# Patient Record
Sex: Female | Born: 1940 | Race: White | Hispanic: No | Smoking: Never smoker
Health system: Southern US, Community
[De-identification: ages and names within clinical notes are randomized; demographics above are authoritative.]

## PROBLEM LIST (undated history)

## (undated) DIAGNOSIS — F0390 Unspecified dementia without behavioral disturbance: Secondary | ICD-10-CM

## (undated) DIAGNOSIS — I10 Essential (primary) hypertension: Secondary | ICD-10-CM

## (undated) HISTORY — PX: BACK SURGERY: SHX140

## (undated) HISTORY — PX: NOSE SURGERY: SHX723

## (undated) HISTORY — PX: INNER EAR SURGERY: SHX679

---

## 2000-01-31 ENCOUNTER — Other Ambulatory Visit: Admission: RE | Admit: 2000-01-31 | Discharge: 2000-01-31 | Payer: Self-pay | Admitting: *Deleted

## 2000-03-14 ENCOUNTER — Encounter: Admission: RE | Admit: 2000-03-14 | Discharge: 2000-03-14 | Payer: Self-pay | Admitting: *Deleted

## 2000-03-14 ENCOUNTER — Encounter: Payer: Self-pay | Admitting: *Deleted

## 2001-03-24 ENCOUNTER — Encounter: Payer: Self-pay | Admitting: Internal Medicine

## 2001-03-24 ENCOUNTER — Encounter: Admission: RE | Admit: 2001-03-24 | Discharge: 2001-03-24 | Payer: Self-pay | Admitting: Internal Medicine

## 2001-06-18 ENCOUNTER — Emergency Department (HOSPITAL_COMMUNITY): Admission: EM | Admit: 2001-06-18 | Discharge: 2001-06-18 | Payer: Self-pay | Admitting: Emergency Medicine

## 2001-06-18 ENCOUNTER — Encounter: Payer: Self-pay | Admitting: Emergency Medicine

## 2001-09-04 ENCOUNTER — Encounter: Admission: RE | Admit: 2001-09-04 | Discharge: 2001-10-10 | Payer: Self-pay | Admitting: Specialist

## 2002-03-25 ENCOUNTER — Encounter: Admission: RE | Admit: 2002-03-25 | Discharge: 2002-03-25 | Payer: Self-pay | Admitting: Unknown Physician Specialty

## 2002-03-25 ENCOUNTER — Encounter: Payer: Self-pay | Admitting: Internal Medicine

## 2002-03-26 ENCOUNTER — Other Ambulatory Visit: Admission: RE | Admit: 2002-03-26 | Discharge: 2002-03-26 | Payer: Self-pay | Admitting: Internal Medicine

## 2002-10-01 ENCOUNTER — Ambulatory Visit (HOSPITAL_COMMUNITY): Admission: RE | Admit: 2002-10-01 | Discharge: 2002-10-01 | Payer: Self-pay | Admitting: Neurology

## 2002-10-01 ENCOUNTER — Encounter: Payer: Self-pay | Admitting: Neurology

## 2003-03-29 ENCOUNTER — Encounter: Admission: RE | Admit: 2003-03-29 | Discharge: 2003-03-29 | Payer: Self-pay | Admitting: Internal Medicine

## 2003-03-29 ENCOUNTER — Encounter: Payer: Self-pay | Admitting: Internal Medicine

## 2003-04-05 ENCOUNTER — Encounter: Admission: RE | Admit: 2003-04-05 | Discharge: 2003-04-05 | Payer: Self-pay | Admitting: Internal Medicine

## 2003-04-05 ENCOUNTER — Encounter: Payer: Self-pay | Admitting: Internal Medicine

## 2003-11-28 ENCOUNTER — Emergency Department (HOSPITAL_COMMUNITY): Admission: EM | Admit: 2003-11-28 | Discharge: 2003-11-28 | Payer: Self-pay | Admitting: Family Medicine

## 2004-03-29 ENCOUNTER — Other Ambulatory Visit: Admission: RE | Admit: 2004-03-29 | Discharge: 2004-03-29 | Payer: Self-pay | Admitting: Internal Medicine

## 2004-04-06 ENCOUNTER — Encounter: Admission: RE | Admit: 2004-04-06 | Discharge: 2004-04-06 | Payer: Self-pay | Admitting: Internal Medicine

## 2005-04-12 ENCOUNTER — Encounter: Admission: RE | Admit: 2005-04-12 | Discharge: 2005-04-12 | Payer: Self-pay | Admitting: Internal Medicine

## 2006-04-16 ENCOUNTER — Encounter: Admission: RE | Admit: 2006-04-16 | Discharge: 2006-04-16 | Payer: Self-pay | Admitting: Internal Medicine

## 2006-08-22 ENCOUNTER — Inpatient Hospital Stay (HOSPITAL_COMMUNITY): Admission: EM | Admit: 2006-08-22 | Discharge: 2006-08-25 | Payer: Self-pay | Admitting: Emergency Medicine

## 2006-11-30 ENCOUNTER — Encounter: Admission: RE | Admit: 2006-11-30 | Discharge: 2006-11-30 | Payer: Self-pay | Admitting: Internal Medicine

## 2006-12-09 ENCOUNTER — Encounter: Admission: RE | Admit: 2006-12-09 | Discharge: 2006-12-09 | Payer: Self-pay | Admitting: Internal Medicine

## 2006-12-10 ENCOUNTER — Encounter (INDEPENDENT_AMBULATORY_CARE_PROVIDER_SITE_OTHER): Payer: Self-pay | Admitting: Radiology

## 2006-12-10 ENCOUNTER — Ambulatory Visit (HOSPITAL_COMMUNITY): Admission: RE | Admit: 2006-12-10 | Discharge: 2006-12-10 | Payer: Self-pay | Admitting: Internal Medicine

## 2006-12-30 ENCOUNTER — Encounter: Admission: RE | Admit: 2006-12-30 | Discharge: 2006-12-30 | Payer: Self-pay | Admitting: Radiology

## 2007-04-09 ENCOUNTER — Other Ambulatory Visit: Admission: RE | Admit: 2007-04-09 | Discharge: 2007-04-09 | Payer: Self-pay | Admitting: Internal Medicine

## 2007-04-18 ENCOUNTER — Encounter: Admission: RE | Admit: 2007-04-18 | Discharge: 2007-04-18 | Payer: Self-pay | Admitting: Internal Medicine

## 2007-05-09 ENCOUNTER — Emergency Department (HOSPITAL_COMMUNITY): Admission: EM | Admit: 2007-05-09 | Discharge: 2007-05-09 | Payer: Self-pay | Admitting: Family Medicine

## 2007-05-24 ENCOUNTER — Inpatient Hospital Stay (HOSPITAL_COMMUNITY): Admission: EM | Admit: 2007-05-24 | Discharge: 2007-05-26 | Payer: Self-pay | Admitting: Family Medicine

## 2008-04-19 ENCOUNTER — Encounter: Admission: RE | Admit: 2008-04-19 | Discharge: 2008-04-19 | Payer: Self-pay | Admitting: Internal Medicine

## 2008-05-12 ENCOUNTER — Ambulatory Visit (HOSPITAL_COMMUNITY): Admission: RE | Admit: 2008-05-12 | Discharge: 2008-05-13 | Payer: Self-pay | Admitting: Otolaryngology

## 2009-01-18 ENCOUNTER — Emergency Department (HOSPITAL_COMMUNITY): Admission: EM | Admit: 2009-01-18 | Discharge: 2009-01-18 | Payer: Self-pay | Admitting: Emergency Medicine

## 2009-04-21 ENCOUNTER — Encounter: Admission: RE | Admit: 2009-04-21 | Discharge: 2009-04-21 | Payer: Self-pay | Admitting: Internal Medicine

## 2009-09-07 ENCOUNTER — Encounter: Admission: RE | Admit: 2009-09-07 | Discharge: 2009-09-07 | Payer: Self-pay | Admitting: Internal Medicine

## 2009-10-18 ENCOUNTER — Other Ambulatory Visit: Admission: RE | Admit: 2009-10-18 | Discharge: 2009-10-18 | Payer: Self-pay | Admitting: Interventional Radiology

## 2009-10-18 ENCOUNTER — Encounter: Admission: RE | Admit: 2009-10-18 | Discharge: 2009-10-18 | Payer: Self-pay | Admitting: Internal Medicine

## 2009-12-16 ENCOUNTER — Encounter (INDEPENDENT_AMBULATORY_CARE_PROVIDER_SITE_OTHER): Payer: Self-pay | Admitting: Surgery

## 2009-12-16 ENCOUNTER — Ambulatory Visit (HOSPITAL_COMMUNITY): Admission: RE | Admit: 2009-12-16 | Discharge: 2009-12-17 | Payer: Self-pay | Admitting: Surgery

## 2010-07-02 ENCOUNTER — Encounter: Payer: Self-pay | Admitting: Internal Medicine

## 2010-08-27 LAB — URINE MICROSCOPIC-ADD ON

## 2010-08-27 LAB — COMPREHENSIVE METABOLIC PANEL
BUN: 16 mg/dL (ref 6–23)
Chloride: 98 mEq/L (ref 96–112)
GFR calc Af Amer: >60 mL/min (ref >60)
GFR calc non Af Amer: >60 mL/min (ref >60)
Potassium: 4.6 mEq/L (ref 3.5–5.1)
Sodium: 138 mEq/L (ref 135–145)

## 2010-08-27 LAB — URINALYSIS, ROUTINE W REFLEX MICROSCOPIC
Glucose, UA: >1000 mg/dL — AB
Hgb urine dipstick: NEGATIVE
Nitrite: POSITIVE — AB
Protein, ur: NEGATIVE mg/dL

## 2010-08-27 LAB — GLUCOSE, CAPILLARY
Glucose-Capillary: 126 mg/dL — ABNORMAL HIGH (ref 70–99)
Glucose-Capillary: 144 mg/dL — ABNORMAL HIGH (ref 70–99)
Glucose-Capillary: 154 mg/dL — ABNORMAL HIGH (ref 70–99)
Glucose-Capillary: 166 mg/dL — ABNORMAL HIGH (ref 70–99)
Glucose-Capillary: 255 mg/dL — ABNORMAL HIGH (ref 70–99)

## 2010-08-27 LAB — CBC
HCT: 40.8 % (ref 36.0–46.0)
MCH: 31.5 pg (ref 26.0–34.0)
MCHC: 34.7 g/dL (ref 30.0–36.0)
MCV: 90.8 fL (ref 78.0–100.0)
Platelets: 164 10*3/uL (ref 150–400)
RBC: 4.49 MIL/uL (ref 3.87–5.11)
WBC: 5.5 10*3/uL (ref 4.0–10.5)

## 2010-08-27 LAB — DIFFERENTIAL
Basophils Relative: 1 % (ref 0–1)
Eosinophils Absolute: 0.2 10*3/uL (ref 0.0–0.7)
Eosinophils Relative: 3 % (ref 0–5)
Lymphs Abs: 1.4 10*3/uL (ref 0.7–4.0)
Monocytes Absolute: 0.5 10*3/uL (ref 0.1–1.0)
Monocytes Relative: 10 % (ref 3–12)

## 2010-08-27 LAB — CALCIUM: Calcium: 8.2 mg/dL — ABNORMAL LOW (ref 8.4–10.5)

## 2010-08-27 LAB — PROTIME-INR
INR: 1.06 (ref 0.00–1.49)
Prothrombin Time: 13.7 seconds (ref 11.6–15.2)

## 2010-09-17 LAB — COMPREHENSIVE METABOLIC PANEL
ALT: 18 U/L (ref 0–35)
Alkaline Phosphatase: 78 U/L (ref 39–117)
BUN: 14 mg/dL (ref 6–23)
CO2: 26 mEq/L (ref 19–32)
Chloride: 103 mEq/L (ref 96–112)
GFR calc non Af Amer: >60 mL/min (ref >60)
Glucose, Bld: 126 mg/dL — ABNORMAL HIGH (ref 70–99)
Potassium: 4.1 mEq/L (ref 3.5–5.1)
Sodium: 138 mEq/L (ref 135–145)
Total Bilirubin: 1.2 mg/dL (ref 0.3–1.2)

## 2010-09-17 LAB — URINALYSIS, ROUTINE W REFLEX MICROSCOPIC
Bilirubin Urine: NEGATIVE
Hgb urine dipstick: NEGATIVE
Ketones, ur: NEGATIVE mg/dL
Nitrite: NEGATIVE
pH: 6.5 (ref 5.0–8.0)

## 2010-09-17 LAB — CBC
HCT: 39.1 % (ref 36.0–46.0)
Hemoglobin: 13.4 g/dL (ref 12.0–15.0)
RBC: 4.2 MIL/uL (ref 3.87–5.11)
RDW: 13.2 % (ref 11.5–15.5)
WBC: 7 10*3/uL (ref 4.0–10.5)

## 2010-09-17 LAB — DIFFERENTIAL
Basophils Absolute: 0 10*3/uL (ref 0.0–0.1)
Basophils Relative: 0 % (ref 0–1)
Eosinophils Absolute: 0.1 10*3/uL (ref 0.0–0.7)
Monocytes Relative: 7 % (ref 3–12)
Neutro Abs: 4.4 10*3/uL (ref 1.7–7.7)
Neutrophils Relative %: 64 % (ref 43–77)

## 2010-09-17 LAB — GLUCOSE, CAPILLARY

## 2010-10-24 NOTE — Op Note (Signed)
Sheena [name], [name]:  [id number]   MEDICAL RECORD NO.:  [id number]          PATIENT TYPE:  AMB   LOCATION:  SDS                          FACILITY:  [facility]   PHYSICIAN:  Carolan Shiver, M.D.    DATE OF BIRTH:  11-08-1940   DATE OF PROCEDURE:  05/12/2008  DATE OF DISCHARGE:                               OPERATIVE REPORT   JUSTIFICATION FOR PROCEDURE:  Sheena [name] is a very pleasant 70 year old  white female, type 1 diabetic, here today for a multichannel cochlear  implant right ear to treat profound bilateral sensorineural deafness.  Sheena [name] had developed progressive hearing loss beginning 4 years ago.  She had been wearing binaural hearing aids.  She thought her left ear  was heard better than her right ear.  She denied otorrhea, otalgia,  tinnitus, or vertigo.  She had had a positive history of childhood ear  infections and drained for her ears consistently.  She had undergone  multiple tympanoplasties by Dr. Charolotte Eke in the past.  She was  unable to speak on the telephone.  She was trying to use a speaker  phone.  She could talk to certain people such as her daughter-in-law and  her son.  She is a known insulin dependent diabetic on 18-20 units of  Lantus insulin a day.  She has also known to have some osteoporosis.   On physical examination, on September 01, 2007, the patient was found to  have fairly good speech, better than I thought for her hearing loss.  It  was felt that she might even be far-advanced otosclerotic.  External  ears and canals were stable.  Tympanic membranes were atrophic but  intact.  They had a scarred appearance from her previous  tympanoplasties.  She had a negative 256 tuning fork in her left ear and  did not lateralize on Weber.  Audiometric testing showed severe to  profound bilateral sensorineural hearing losses with speech reception  threshold of 100 dB right ear and 105 dB left ear.  We could not test  discrimination.   Aided thresholds showed she was aided in the left ear  up to 50 dB level, however, aided left ear MCL was at 70 dB.  She had  48% discrimination.   CNC list test II was anywhere from 4-21% and 4% correct on HINT testing.  She had a 4% correct list 13 and 14% correct list of 14.  At that time,  she was diagnosed as having severe to profound bilateral sensorineural  hearing loss, however, she was obtaining 48% discrimination in her best  aided condition and was not quite a candidate for cochlear implantation.  Over the year, her hearing deteriorated and when seen in followup on  April 09, 2008, she was found to have again profound bilateral  sensorineural hearing losses.  Below shoulder audiogram, she had some  residual hearing in her left ear with a speech reception threshold of 80  dB and 40% discrimination.  She had profound hearing loss in the right  ear.  She was getting some minor benefit  again with her left hearing  aid, but very little speech perception and was felt to be a candidate  for cochlear implant.  CT scan of her temporal bones was performed at  [facility] on April 12, 2008 and she was found to have  normal temporal bone anatomy with patent cochleae bilaterally.  I had  her evaluated by [facility] at [facility].  She was cleared  for a general anesthetic.  Chest x-ray showed no acute cardiopulmonary  process and EKG showed a normal sinus rhythm.  EMG showed ice water  calorics of 44 degrees per second right ear and 40 degrees per second  left ear.   The patient and her son were counseled extensively concerning the  advantages and disadvantages as well as alternatives of cochlear  implantation.  She understood that there were no guarantees and that  there were a third low performers, one-third middle performers, and one-  third high performers.  She understood that performance after cochlear  implant dependent on the ability of her brain to be  trained in use of  the cochlear implant signal.   She had been given video DVD information by the [employer] preop  information which she read and watched.   Risks, complications, and alternatives of cochlear implant were  explained to her.  Questions were invited and answered and informed  consent was signed and witnessed.  Again, all of this had been explained  to her son who is hearing.   The patient was scheduled for a [employer] HiRes 90K with 1J  electrode multichannel cochlear implant, right ear on the morning of  May 12, 2008 at [facility] OR room #1 under general endotracheal  anesthesia.   JUSTIFICATION FOR OUTPATIENT SETTING:  This patient's age and need for  general endotracheal anesthesia.   JUSTIFICATION FOR OVERNIGHT STAY:  1. 23 hours of observation of facial function status post multichannel      cochlear implant, right ear.  2. The patient is a type 1 diabetic.   PREOPERATIVE DIAGNOSES:  1. Profound bilateral sensorineural hearing loss, beyond hearing aids.  2. Type 1 diabetes mellitus.  3. History of osteoporosis.   POSTOPERATIVE DIAGNOSES:  1. Profound bilateral sensorineural hearing loss, beyond hearing aids.  2. Type 1 diabetes mellitus.  3. History of osteoporosis.   OPERATION:  [employer] HiRes 90K with 1J electrode multichannel  cochlear implant right ear.   SURGEON:  Carolan Shiver, MD   ANESTHESIA:  General endotracheal, Dr. Kipp Brood.   COMPLICATIONS:  None.   SUMMARY OF REPORT:  After the patient was taken to the operating room,  she was placed in supine position.  An IV had been begun in the holding  area.  She had received 1 gram of IV Ancef.  General IV induction was  then performed under the guidance of Dr. Noreene Larsson.  The patient was orally  intubated without difficulty.  Eyelids were taped shut.  She was  properly positioned and monitored.  Elbows and ankles were padded with  foam rubber.  The Foley catheter was  inserted.  Several wire needle  electrodes were inserted into the right orbicularis and right  orbicularis oculi muscles as well as suprasternal notch connected to a  NIM facial nerve monitor pre-amplifier.  The patient's hair in the right  postauricular area was clipped and the stocking cap was applied to  expose her right temporal parietal scalp.  Using an [employer] BTE  template, the  correct position for the ICS was determined and marked  with a marking pen.  A lazy S postauricular incision was then marked and  infiltrated with 0.5 mL of 1% Xylocaine with 1:200,000 epinephrine.  The  patient's right ear and hemiface were then prepped with Betadine and  draped in standard fashion.  A time-out was performed.   The right postauricular incision was then made with a 15 blade and  carried down through skin and subcutaneous tissue.  A T-shaped incision  was made in the mastoid periosteum and anterior and posterior  subperiosteal flaps were elevated.  The posterior ear canal was  identified as was the spine of Henle.  The superior portion of the  incision was then made through the skin with a 15 blade and then through  the subcutaneous tissue with a unipolar cautery.  A mucoperiosteal flap  was elevated.  Previously I had tattooed through the skin to mark the  place for the ICS.  Loraine Leriche was clearly identified.  The site was copiously  irrigated with bacitracin containing saline.  Self-retaining retractors  were placed.   Mastoidectomy:  A complete mastoidectomy was then performed with cutting  burs using continuous suction irrigation.  The mastoid was found to be  poorly pneumatized.  The sigmoid sinus was identified as was the  digastric ridge.  Digastric periosteum was identified.  The posterior  canal wall was thin.  The antrum was opened and the incus was located in  the fossa incudis.  The facial recess was then opened in an oval shape  as per cochlear implant fashion.  The chorda  tympani nerve was  transected as it was coursing directly through the facial recess.  The  stapes was identified and was found to be partially fixed.  The round  window niche was identified.  A 2-mm diamond bur was used to create a  cochleostomy.  The scala tympani was egg-shelled and then opened with a  Tabb knife.  The cochleostomy was enlarged to the size of the ABC  cochleostomy sizer.  The area was then packed with a 1 x 1 cm square of  cottonoid soaked in saline.   Attention was then turned to the temporal parietal skull.  A circular  well was drilled in the skull down to the diploic space and bleeding was  controlled with bone wax.  The trough was drilled from the anterior  portion of the well to the posterior-superior margin of the mastoid  cavity.  The site was then copiously irrigated with bacitracin  containing saline.  Two sets of holes were then drilled on either side  of the well with a #1 mm cutting bur.  A 5-0 Nurolon suture was threaded  through the holes.  Site was then again copiously irrigated with  bacitracin containing saline.  Gowns and gloves were changed.   Cochlear implant insertion:  An [employer] HiRes 90K with 1J  electrode multichannel cochlear implant, serial [id number], sterilization  date August 2011 was then opened under saline and placed on the back  table.  The implant was then carefully inspected under the microscope.  It was then slid into its pocket with the ICS in the bony well and it  was sutured into place with a 5-0 Nurolon suture.  The plastic insertion  tube was then loaded onto the insertion tool with the slot at 10  o'clock.  The plastic insertion tube was then inserted into the basilar  turn and the electrode  array was gently inserted into the cochlea in one  smooth motion.  This was a full length insertion with the J hook portion  at the cochleostomy.  Small squares of fascia were then used to plug the  cochleostomy and a large piece  of muscle was then used to obliterate the  facial recess.  Electrode array was coiled into the mastoid cavity and  held in place with 3 rectangles of Gelfoam moistened in saline.  The  mucoperiosteal layer was then closed with interrupted 3-0 Monocryls.  The incision was then closed in 2 more layers using interrupted inverted  3-0 Monocryl for subcutaneous layer and skin was closed with a running  locking 5-0 Ethilon.  This was a watertight closure.  The site was very  dry and therefore drain was not used.   Intraoperative x-ray:  An intraoperative x-ray was then taken and the  electrode array was found to be well coiled inside the cochlea in  standard fashion.   The patient's postauricular incision was then painted with bacitracin  ointment.  The ear was padded with Telfa and cotton and a standard adult  Glasscock mastoid dressing was applied loosely in the standard fashion.  Her Foley catheter was removed.  She was then awakened, extubated, and  transferred to her hospital bed.  She appeared to tolerate the general  endotracheal anesthesia and the procedures well and left the operating  room in stable condition.   Total fluids, 550 mL.  Total urine output, 250 mL.  Total blood loss,  less than 20 mL.  Sponge, needle, and cotton ball counts were correct at  the termination of the procedure.  No specimens were sent to pathology.   Sheena [name] will be admitted to a private room for overnight observation.  If stable overnight, she will be discharged on May 13, 2008 with her  son who will be instructed to return her to my office on May 19, 2008 at 2:50 p.m.   DISCHARGE MEDICATIONS:  1. Cefzil 500 mg p.o. b.i.d. x10 days with food #20.  2. Percocet 5/325 one p.o. q.4 h. p.r.n. pain #30 tablets.  3. Phenergan suppositories 12.5 mg PR q.6 h. p.r.n. nausea #2.  4. Valium 5 mg one p.o. t.i.d. p.r.n. vertigo #30 tablets.   The patient is to continue on all of her home medications  including her  Lantus insulin 20 units q. 6 p.m.  She is to monitor her glucose as per  her usual protocol.  She is to call [phone] for any postoperative  problems.  She is to follow a regular ADA diet, keep her head elevated,  avoid aspirin and aspirin products.  She is to avoid water exposure at  the incision for the next 48 hours.  She is to sleep with 2 pillows to  keep her head elevated and she is to call again [phone] for any  postoperative problems directly related to the procedure.  She will be  given both verbal and written instructions.   If the patient heels without complication, she will be connected to her  external sound processor in approximately 3 weeks.           ______________________________  Carolan Shiver, M.D.     EMK/MEDQ  D:  05/12/2008  T:  05/13/2008  Job:  [id number]   cc:   Armstead Peaks, M.D.  [facility]

## 2010-10-24 NOTE — H&P (Signed)
[name], [name] NO.:  [id number]   MEDICAL RECORD NO.:  [id number]          PATIENT TYPE:  OIB   LOCATION:  5155                         FACILITY:  [facility]   PHYSICIAN:  Carolan Shiver, M.D.    DATE OF BIRTH:  1940/09/11   DATE OF ADMISSION:  05/12/2008  DATE OF DISCHARGE:                              HISTORY & PHYSICAL   CHIEF COMPLAINT:  Profound bilateral sensorineural hearing loss with  deafness beyond hearing aids and desiring a cochlear implant, right ear.   SUMMARY OF PRESENT ILLNESS:  Sheena [name] is a 70 year old white female  who has had a 4-year history of progressive sensorineural hearing loss  to the level of profound bilateral deafness beyond hearing aids.  She  thinks that her hearing has decreased over the last 4 years.  She has  been wearing binaural hearing aids and was referred by Abran Duke of  [facility] for further evaluation.  She had had a long  history of chronic ear disease and had multiple tympanoplasties  performed by Dr. Charolotte Eke in the past.  She denied otorrhea,  otalgia, tinnitus, or vertigo.  She was now wearing just a hearing aid  in the left ear.  She had been evaluated in March 2009 and had profound  bilateral sensorineural hearing loss but still had discrimination  ability of 48% in her left ear.  Over the remainder of 2009, her hearing  deteriorated and by October 2009, she was less than 40% discrimination  and became a candidate for multichannel cochlear implant.  Her worst ear  was the right ear.  Therefore, she was recommended for an implant, an  [employer] HiRes 90K with 1J electrode implant in the right ear.  She and her son were extensively counseled.  She watch the [employer] DVDs and was given [employer] preoperative literature.  Our audiologist spent significant time with her talking to her about  proper expectations for multichannel cochlear implant.  She understood  that  there were both one-third low, one-third medium, and one-third high  performers depending on postoperative brain training and ability to use  the cochlear implant signal.  She understood that there were no  guarantees.  Risks, complications, and alternatives were extensively  explained to her including doing nothing, continued to wear hearing  aids, or proceeding with a multichannel cochlear implant.  I felt that  it was better for her to have an unilateral implant in the right ear  because she was relying on some vibrotactile information in the left  ear.   In preparation for multichannel cochlear implantation, the patient  received Pneumovax vaccine for meningitis prophylaxis, she was evaluated  by her family physicians at [facility] at [facility], and  was cleared for a general anesthetic.  She is known to be a type 1  diabetic, on Lantus 20 units q.6 p.m.Marland Kitchen  Chest x-ray showed no active  disease.  EKG showed a normal sinus rhythm.  CT scan of her temporal  bone showed patent cochlea and normal temporal bone anatomy.  ENG showed  symmetric ice water caloric response of 44 degrees per second right ear  and 40 degrees per second left ear.   Again, risks, complications, and alternatives of multichannel cochlear  implantation were explained to the patient and to her son.  Questions  were invited and answered.  An informed consent was signed and  witnessed.  She was scheduled for an [employer] HiRes 90K with 1J  electrode multichannel cochlear implantation on the morning of May 12, 2008, [facility] OR, room #1 under general endotracheal anesthesia.   PAST MEDICAL HISTORY:  No serious illnesses were reported.   OPERATIONS:  Included a hysterectomy, procedure for epistaxis, a  fractured vertebrae in her back, multiple tympanoplasties by Dr.  Charolotte Eke.   MEDICATIONS:  Included Lantus insulin 18-20 units per day.  She was on  multiple other home medications  including:  1. Avandia.  2. Vytorin.  3. Lisinopril.  4. Metformin.  5. Enteric-coated baby aspirin once a day.  6. Multivitamins.  7. Lantus 20 units subcu q.6 p.m.  8. Calcium and vitamins.  9. Fish oil.  10.Vitamins C.  11.Vitamin B12.  12.Vitamin D3, 1000.  13.Albuterol 1 puff as needed.  14.Alendronate sodium once a week.   ALLERGIES TO MEDICATION:  None reported.   FAMILY HISTORY:  Positive for hearing loss.   SOCIAL HISTORY:  She was single, does not use alcohol or tobacco  products.  Husband died approximately 25 years ago.   REVIEW OF SYSTEMS:  Positive for hypertension, type 1 diabetes mellitus,  and osteoporosis.   PHYSICAL EXAMINATION:  GENERAL:  She was a well-developed, well-  nourished, 70 year old white female in no acute distress.  HEENT:  She had normal tone to her voice and did not manifest  centralization of vowels.  At first, I thought she might be a far  advanced otosclerotic.  Her speech was again better than what I would  have expected for hearing loss.  Facial function was intact with no  nystagmus.  Pupils PERRLA.  External ears and canals were stable.  TMs  were atrophic, but intact.  They had a scarred appearance from her  previous tympanoplasties.  She had negative 256 tuning fork.  Left ear  did not lateralize on Weber.  Nose, oral cavity, and neck were negative.  CHEST:  Clear to auscultation.  BREASTS:  Not performed.  ABDOMEN:  Negative.  GENITALIA AND RECTAL:  Not performed.  EXTREMITIES:  Unremarkable except for some osteoporosis.  NEUROLOGIC:  Physiologic with the exception of the profound bilateral  sensorineural hearing loss.   Preoperative audiometric testing on May 10, 2008, documented no  response in the right ear at 105 dB and we could not test any  discrimination.  She had 80% SRT left ear with 32% discrimination on a  PBK one-word list.  [place] CNC testing was previously described in the  operative report.    Preoperative chest x-ray showed no active disease.  EKG showed normal  sinus rhythm.   LABORATORY DATA:  Hemoglobin of 12.5, hematocrit of 37.3, white blood  cell count of 5300, and platelet count of 176,000.  PT was 13.4, PTT 32,  and INR is 1.0.  Sodium was 137, potassium 4.4, CO2 of 100, and glucose  was 27.   CT scan of her temporal bone showed normal temporal bone anatomy and  patent cochleae bilaterally.   IMPRESSION:  1. Profound bilateral sensorineural deafness beyond hearing aids.  2. Type 1 diabetes mellitus.  3. History of  hypertension.  4. Osteoporosis.   PLAN:  The patient is scheduled for an [employer] multichannel  HiRes 90K with 1J electrode, multichannel cochlear implant right ear on  the morning May 12, 2008, under general endotracheal anesthesia at  [facility] OR room #1.   Again, risks, complications, and alternatives were explained to the  patient and to her son.  Questions were invited and answered.  An  informed consent was signed and witnessed.  The patient did receive  preoperative Pneumovax and extensive counseling.  Again, no guarantees  were made.           ______________________________  Carolan Shiver, M.D.     EMK/MEDQ  D:  05/12/2008  T:  05/13/2008  Job:  [id number]

## 2010-10-24 NOTE — H&P (Signed)
[name], QUANT NO.:  [id number]   MEDICAL RECORD NO.:  [id number]          PATIENT TYPE:  INP   LOCATION:  6731                         FACILITY:  [facility]   PHYSICIAN:  Sheena Housekeeper, MD      DATE OF BIRTH:  Sep 24, 1940   DATE OF ADMISSION:  05/23/2007  DATE OF DISCHARGE:                              HISTORY & PHYSICAL   HISTORY OF PRESENT ILLNESS:  Sheena [name] is a 70 year old female with a  past medical history of hypertension, diabetes, hypercholesterolemia,  osteoporosis, who is coming in with about 24 hours of fevers and  anorexia.  She was doing fine yesterday morning, and since that time has  not been able to really take a lot of fluids or have much of an  appetite.  She did have a fever unmeasured at home and has been unable  to sleep.  Was up most of the night last night with really nonspecific  complaints, just a lot of fatigue, some nausea, fever, no diarrhea, no  pain on urination.  No cough, no phlegm.  No chest pain, no shortness of  breath.  No headaches.  No blurred vision.  Her son took her to the  Urgent Care where apparently she was found to be orthostatic when she  moved from a lying to a sitting position.  It was at that point that  they transferred her to the ED here.  When she was here, she had a heart  rate in the 150's and she was given approximately 2 liters of fluid.  While in the emergency department.  She was found to be orthostatic in  the emergency department with a drop in her systolic pressures to almost  30 mmHg.  I did get much of the story from her son.  Other review of  systems was negative.   MEDICATIONS:  1. Calcium 600 mg p.o. b.i.d.  2. Avandia 8 mg once daily.  3. Lisinopril 10 mg daily.  4. Metformin 1000 mg b.i.d.  5. Lantus 17 units subcu q.h.s.  6. Aspirin 81 mg daily.  7. Omegas.  8. Fosamax weekly.   ALLERGIES:  None.   PAST MEDICAL HISTORY:  As above.   SOCIAL HISTORY:  The patient is widowed.  Does not  smoke or drink.   FAMILY HISTORY:  Noncontributory.   PHYSICAL EXAMINATION:  VITAL SIGNS:  Most recently, her temperature was  101.3; blood pressure lying down was 157/79, standing was 122/81; pulse  lying was 130 and standing was 147; O2 respirations were 16, O2  saturation was 97% on room air.  GENERAL:  She is pale appearing, she looks uncomfortable.  She is in no  respiratory distress.  She is well-developed, well-nourished.  HEENT:  Bilateral hearing aids.  Mucous membranes dry.  No scleral  icterus.  No oral lesions.  HEART:  Rate is tachycardic around 120 per minute.  Sinus rhythm with a  loud P2.  ABDOMEN:  Soft, diffusely tender, nondistended, no organomegaly.  EXTREMITIES:  No cyanosis, clubbing or edema.   LABORATORY DATA:  WBC 8.8, hemoglobin  13.2, sodium 133, glucose 157,  creatinine 0.55.  Urinalysis trace leukocytes.  KUB was pending.  EKG  looked at tonight compared with March, tonight just showed some sinus  tachycardia.  No ST-T wave changes, normal axis.   ASSESSMENT:  1. Fever.  2. Anorexia.  3. Urinary tract infection.  4. Hyponatremia.  5. Diabetes.  6. Hypertension.   PLAN:  1. Will admit to medical floor.  2. Will treat with IV fluids and antibiotics at this time for the UTI.      It is unclear exactly if the UTI is the major cause of this.      However, we will check a urine culture and treat her with Cipro at      this time.  3. Will replete her sodium with normal saline.  4. Will continue her home medications except for Lantus at this time.      Will use a sliding scale for coverage in addition to her home      medications.  5. Will continue her other home medications for her hypertension and      her aspirin.  6. DVT prophylaxis.  Will be done with Lovenox.  7. Will provide Tylenol and Phenergan as needed.      Sheena Spence, MD   Electronically Signed     ______________________________  Sheena Housekeeper, MD    JB/MEDQ  D:  05/24/2007   T:  05/24/2007  Job:  [id number]

## 2010-10-24 NOTE — Discharge Summary (Signed)
Sheena [name], Sheena [name]                 ACCOUNT NO.:  [id number]   MEDICAL RECORD NO.:  [id number]          PATIENT TYPE:  INP   LOCATION:  6731                         FACILITY:  [facility]   PHYSICIAN:  Georgann Housekeeper, MD      DATE OF BIRTH:  April 27, 1941   DATE OF ADMISSION:  05/23/2007  DATE OF DISCHARGE:  05/26/2007                               DISCHARGE SUMMARY   DISCHARGE DIAGNOSES:  1. Weakness.  2. Dehydration.  3. Urinary tract infection.   MEDICATION ON DISCHARGE:  1. Calcium 600 mg three times a day.  2. Avandia 8 mg daily.  3. Lisinopril 10 mg daily.  4. Metformin 500 mg two tablets twice a day.  5. Vytorin 10/20 one a day.  6. Aspirin 81 mg daily.  7. Lantus 17 units daily.  8. Fosamax 70 mg once a week.  9. Multivitamin.  10.Fish Oil capsules daily.   As far as her laboratory data, her hemoglobin was 13.2, white count 8.8.  Chemistry:  Sodium 136, potassium 3.8, creatinine 0.5.  LFTs are normal.  Urine culture is pending.  UA was positive.  Abdominal chest x-ray  negative.   HOSPITAL COURSE:  A 70 year old female with chills and anorexia admitted  on Friday with mild dehydration, found to have UTI and some flu-like  symptoms.  Patient was admitted to floor, IV fluid was started, started  on ciprofloxacin IV.  Patient started feeling better, no nausea or  vomiting.  She had a little bit of constipation and she had no evidence  of any fever, white count remained normal.  The patient's chemistries  remained normal.  She will be continued on ciprofloxacin and cultures  will be followed up outpatient.  Will arrange for home health and  continue the fluids at home p.o. and other medications.  Blood sugars  remained in the 140-180 range in the hospital.  Hypertension, blood  pressure remained stable.  She was on lisinopril.  Patient will continue  on her home medications.      Georgann Housekeeper, MD  Electronically Signed     KH/MEDQ  D:  05/26/2007  T:  05/26/2007  Job:   [id number]

## 2010-10-24 NOTE — Discharge Summary (Signed)
Sheena [name], Sheena [name] NO.:  [id number]   MEDICAL RECORD NO.:  [id number]          PATIENT TYPE:  OIB   LOCATION:  5155                         FACILITY:  [facility]   PHYSICIAN:  Carolan Shiver, M.D.    DATE OF BIRTH:  [birth year]   DATE OF ADMISSION:  05/12/2008  DATE OF DISCHARGE:  05/13/2008                               DISCHARGE SUMMARY   ADMISSION DIAGNOSES:  1. Profound bilateral sensorineural hearing loss beyond hearing aids.  2. Type 1 diabetes mellitus.  3. Osteoporosis.  4. History of reactive airways.   DISCHARGE DIAGNOSES:  1. Profound bilateral sensorineural hearing loss beyond hearing aids.  2. Type 1 diabetes mellitus.  3. Osteoporosis.  4. History of reactive airways.   OPERATION:  Advanced [employer]ion[employer] High Res 90K with one J electrode  multichannel cochlear implant, right ear.   SURGEON:  Carolan Shiver, MD.   ANESTHESIA:  General endotracheal, Guadalupe Maple, MD   COMPLICATIONS:  None.   DISCHARGE STATUS:  Stable.   SUMMARY OF HOSPITALIZATION:  Sheena [name] is a 70 year old white female  with profound bilateral sensorineural hearing loss.  Her hearing has  deteriorated over the last 5 years.  She was attempting to wear hearing  aid in the left ear but this provided only vibrotactile information.  Her discrimination has dropped below 40%.  She was recommended for  multichannel cochlear implant, right ear.  She was prepared for the  implant by watching [employer] TDD and reading all of the cochlear  implant literature, she underwent Pneumovax vaccine for pneumococcal  meningitis prophylaxis.  She had a CT scan of her temporal bones, which  showed a patent cochleas bilaterally.  She was evaluated by her family  physician for preanesthetic physical examination and was cleared for  general anesthetic.  Chest x-ray was negative.  EKG showed normal sinus  rhythm.  Preop laboratory tests were within normal limits.   She is a known type 1  diabetic on Lantus insulin 20 units q. 6 p.m.,  metformin, and Avandia in fairly good control, although she does not  watch her diet well.   Risks, complications, and alternatives to cochlear implantation were  explained to her and to her son.  Questions were invited and answered  and informed consent for right cochlear implant was signed by the  patient.  Informed consent was also provided in writing for her because  she could not hear but could read.  No guarantees were made.   On the morning of May 12, 2008, the patient was taken to [facility]  Operating Room #1 and underwent an uncomplicated advanced Bionics High-  Res 90K 90 K with one J electrode multichannel cochlear implant, right  ear.  She was found to have normal temporal bone anatomy.  Full length  insertion of the electrode array was accomplished without difficulty.  Intraoperative x-ray showed good position of the implant electrode array  within her cochlea.   The patient had an uncomplicated afebrile postoperative course.  She was  admitted to the PACU and awakened without difficulty.  She  was then  cared for on 5155.  She remained afebrile, awake, and alert.  She was  stable and was able to ambulate.  Her SaO2 was above 90 throughout the  hospitalization on room air.   In the first postoperative evening, she had intact facial function, no  nystagmus.  Her incision was dry and drain was not placed.  Glucose was  in the mid 140-150 range.  She did require several units of NovoLog  insulin and that did receive her Lantus insulin 20 units at 6:00 p.m.   The first postoperative morning on May 13, 2008, she was awake,  alert, and stable.  She was able to ambulate.  She had no nystagmus.  Facial function was intact.  Her right postauricular incision was  healing well without signs of infection or hematoma.   In the morning of May 13, 2008, she was recommended for discharge  home.  She was instructed to return  to my office on May 19, 2008, at  2:50 p.m.   DISCHARGE MEDICATIONS:  1. Cefzil 5 mg p.o. b.i.d. x10 days #20.  2. Percocet 5/325 1 p.o. q.4 h. p.r.n. pain #30.  3. Phenergan suppositories 12.5 mg 1 p.o. q.6 h p.r.n. nausea, #2.  4. Valium 5 mg p.o. t.i.d. p.r.n., vertigo #30.   She was to continue on all of her home medications, which included:  1. Albuterol 2 puffs q.6 h. p.r.n.  2. Avandia 8 mg p.o. daily.  3. Vytorin 10/20 mg p.o. daily.  4. Lisinopril 10 mg p.o. daily.  5. Metformin 500 mg b.i.d.  6. Lantus insulin 20 units subcu q. 6 p.m.  7. Multiple vitamins.   She was instructed to follow regular diet, keep her head elevated, avoid  aspirin or aspirin products and avoid heavy lifting or straining more  than 20 pounds.  She is to keep the incision dry for the next 24 hours.  She is to paint the incision with bacitracin ointment b.i.d. and she is  to call [phone] for any postoperative problems directly related to the  procedure.  She was instructed to ambulate with a cane or walker.  She  had unsteadiness over the next week, so as to prevent a fall.  Again,  she is to call [phone] for any postoperative problems directly related  to the procedure.   There were no pathology specimens.  Admission laboratory data showed  hemoglobin of 12.5, hematocrit 37.3, white blood cell count of 5300, and  platelet count of 176,000.  PT was 13.4, PTT 32, and INR 1.0.  Electrolytes were within normal limits.  Sodium of 137, potassium 4.4,  chloride 100, and CO2 of 27.  Admission chest x-ray showed no active  disease.  EKG showed some sinus tachycardia.  Preadmission EKG showed a  normal sinus rhythm.   At the time of hospitalization, the patient was in short stay unit,  [facility] OR, room #1, the PACU, and room 5155.   If the patient heals well without any complications, she will be fitted  with her external hardware in approximately 3 weeks and have her initial  cochlear implant  mapping at that time.           ______________________________  Carolan Shiver, M.D.     EMK/MEDQ  D:  05/13/2008  T:  05/13/2008  Job:  [id number]   cc:   [facility]

## 2010-10-27 NOTE — H&P (Signed)
[name], Sheena [name]                 ACCOUNT NO.:  [id number]   MEDICAL RECORD NO.:  [id number]          PATIENT TYPE:  INP   LOCATION:  4711                         FACILITY:  [facility]   PHYSICIAN:  Barnetta Chapel, MDDATE OF BIRTH:  09/10/1940   DATE OF ADMISSION:  08/22/2006  DATE OF DISCHARGE:                              HISTORY & PHYSICAL   PRESENTING PROBLEM:  1. Dizziness.  2. Diarrhea.  3. Poor p.o. intake.   HISTORY OF PRESENTING COMPLAINTS:  The patient is a 70 year old female  with past medical significant for diabetes, hypertension, hyperlipidemia  and osteoarthritis.  According to the patient and the son, the patient  has been feeling unwell since August 12, 2006.  She was said to have  bronchitis.  She was treated with Levaquin for about 10 days.  The  patient has not made much improvement.  She started having diarrhea and  was said to have 5 to 6 bowel movements.  These stools were watery, but  non-bloody.  There is no associated abdominal pain.  The patient reports  feeling nauseous.  She has vomited only once.  There is no previous  history of GI bleed.  At the onset of the illness, she had fever which  only lasted for about one day.  There is no chills or fever for now.  The patient's p.o. intake has been very poor.  She is very weak.  She  felt very dizzy earlier on in the day while trying to take some steps  with the son, hence, this admission.  No headache. There is no neck  pain, no neck stiffness, no chest pain. However, the patient has  nonproductive cough.  No shortness of breath, no abdominal pain, no  dysuria or urinary frequency.   PAST MEDICAL HISTORY:  1. Diabetes mellitus.  2. Hypertension.  3. Dyslipidemia.  4. Osteoarthritis.   ALLERGIES:  NONE.   MEDICATIONS PRIOR TO ADMISSION:  1. Calcium plus D 600 mg p.o. b.i.d.  2. Avandia 800 mg once p.o. daily.  3. Lisinopril 10 mg once daily.  4. Metformin 1000 mg b.i.d.  5. Subcutaneous Lantus  insulin 17 units q.h.s.  6. EC aspirin 81 mg once daily.  7. Omega 3 fatty acids (dose is unknown for now).  8. Cholestoril Plus.   SOCIAL HISTORY:  The patient is widowed, and has only one child.  She  does not smoke cigarettes, does not drink alcohol or use illicit drug.   FAMILY HISTORY:  Noncontributory.   REVIEW OF SYSTEMS:  Review of system is essentially as above.   LABORATORY DATA:  Labs done so far, the white blood count is 8.4,  hemoglobin is 14.8, hematocrit is 42.9, the MCV is 90.2 and the platelet  count is 309.   PHYSICAL EXAMINATION:  On admission, the temperature is 97.5.  Blood  pressure is 158/81.  Heart rate is 109 and the respiratory rate is 20  with O2 sat of 99% on room air.  GENERAL CONDITION:  The patient is not in any obvious distress.  HEENT:  No pallor, no jaundice.  The extraocular muscles are intact.  The buccal mucosa is dry.  NECK:  Supple, there is no lymphadenopathy and there is no raised JVD.  LUNGS:  Clear to auscultation.  CV:  S1, S2, tachycardia.  With known heart murmur appreciated.  ABDOMEN:  Soft, flat and nontender.  No organomegaly.  NEURO:  Nonfocal.  The patient is awake, alert, oriented to time, place  and person.  The patient was moves all limbs.  EXTREMITIES:  No edema.   IMPRESSION:  1. Gastroenteritis, likely viral in origin.  2. Dehydration.  3. Dizziness (I think that the dizziness is likely secondary to      orthostasis as the patient is quite prerenal).  4. Diabetes mellitus.  5. Dyslipidemia.  6. Hypertension.   PLAN:  We will admit this patient under Dr. Kirby Funk.  We will  aggressively rehydrate the patient.  We will check for orthostasis.  We  will use subcutaneous Lovenox for DVT prophylaxis.  We will also use  Protonix for GI prophylaxis.  Meanwhile, we will send stool for C. diff  considering the fact that the patient was on antibiotics for a while,  culture and sensitivity, ova parasites and fecal  leukocytes.  Will  control the patient's blood sugar and blood pressure.  Further  management will depend on hospital course.      Barnetta Chapel, MD  Electronically Signed     SIO/MEDQ  D:  08/22/2006  T:  08/23/2006  Job:  [id number]   cc:   Thora Lance, M.D.

## 2010-10-27 NOTE — Discharge Summary (Signed)
NAMEEMBERLY, TOMASSO NO.:  [id number]   MEDICAL RECORD NO.:  [id number]          PATIENT TYPE:  INP   LOCATION:  5703                         FACILITY:  [facility]   PHYSICIAN:  Thora Lance, M.D.  DATE OF BIRTH:  Sep 20, 1940   DATE OF ADMISSION:  08/22/2006  DATE OF DISCHARGE:  08/25/2006                               DISCHARGE SUMMARY   REASON FOR ADMISSION:  This 70 year old female with a medical history of  diabetes, hypertension, hyperlipidemia and osteoarthritis presented with  diarrhea and nausea, but no abdominal pain.  She had generalized  weakness and very poor p.o. intake.   SIGNIFICANT FINDINGS:  Temperature 97.5, blood pressure 158/81, heart  rate 109, respirations 20.  Lungs were clear.  Heart tachycardic,  regular.  No murmurs, gallops or rubs.  Abdomen soft, flat and  nontender.  No organomegaly.   LABORATORY ON ADMISSION:  CBC revealed WBC 8.4, hemoglobin 14.8,  platelets 309.  Sodium 136, potassium 3.6, chloride 96, bicarbonate 22,  glucose 142, BUN 11, creatinine 0.8, total protein 7.1, albumin 4.3, AST  19, ALT 20, alkaline phosphatase 87, total bilirubin 2.3, magnesium 1.6,  phosphorus 3.8.  CK 44, CKMB 0.8, troponin I 0.01  Urinalysis negative.   HOSPITAL COURSE:  The patient was admitted with diarrhea, failure to  thrive and weakness.  C. difficile antigen was checked and was negative.  She was given IV fluids.  Her diabetes was treated with her outpatient  medications and a sliding scale.  The patient improved over the ensuing  48 hours and was discharged in good condition on March 16.   DISCHARGE DIAGNOSES:  1. Acute gastroenteritis.  2. Diabetes mellitus.  3. Hypertension.  4. Dyslipidemia.  5. Degenerative joint disease.   DISCHARGE MEDICATIONS:  1. Albuterol MDI 2 puffs q.6 p.r.n.  2. Vicodin 5/500 1/2 q.6 hours as needed.  3. Calcium plus D one a day.  4. Avandia 8 mg daily.  5. Lisinopril 10 mg daily.  6. Metformin 1,000 mg  b.i.d.  7. Vytorin 10/20 once a day.  8. Aspirin 81 mg daily.  9. Lantus 17 units daily.   FOLLOWUP:  Follow up in 1-2 weeks with Dr. Donette Larry.   ACTIVITY:  As tolerated.   DIET:  Low sodium diabetic diet.           ______________________________  Thora Lance, M.D.     JJG/MEDQ  D:  11/07/2006  T:  11/07/2006  Job:  [id number]

## 2010-11-06 ENCOUNTER — Emergency Department (HOSPITAL_COMMUNITY)
Admission: EM | Admit: 2010-11-06 | Discharge: 2010-11-07 | Disposition: A | Discharge status: Home / Self Care | Payer: Medicare Other | Attending: Emergency Medicine | Admitting: Emergency Medicine

## 2010-11-06 DIAGNOSIS — E1169 Type 2 diabetes mellitus with other specified complication: Secondary | ICD-10-CM | POA: Insufficient documentation

## 2010-11-06 DIAGNOSIS — Z79899 Other long term (current) drug therapy: Secondary | ICD-10-CM | POA: Insufficient documentation

## 2010-11-06 DIAGNOSIS — Z794 Long term (current) use of insulin: Secondary | ICD-10-CM | POA: Insufficient documentation

## 2010-11-06 DIAGNOSIS — N390 Urinary tract infection, site not specified: Secondary | ICD-10-CM | POA: Insufficient documentation

## 2010-11-06 DIAGNOSIS — Z7982 Long term (current) use of aspirin: Secondary | ICD-10-CM | POA: Insufficient documentation

## 2010-11-06 DIAGNOSIS — F411 Generalized anxiety disorder: Secondary | ICD-10-CM | POA: Insufficient documentation

## 2010-11-06 DIAGNOSIS — R5381 Other malaise: Secondary | ICD-10-CM | POA: Insufficient documentation

## 2010-11-06 DIAGNOSIS — I10 Essential (primary) hypertension: Secondary | ICD-10-CM | POA: Insufficient documentation

## 2010-11-06 DIAGNOSIS — R112 Nausea with vomiting, unspecified: Secondary | ICD-10-CM | POA: Insufficient documentation

## 2010-11-06 DIAGNOSIS — R630 Anorexia: Secondary | ICD-10-CM | POA: Insufficient documentation

## 2010-11-06 LAB — POCT I-STAT, CHEM 8
Creatinine, Ser: 0.8 mg/dL (ref 0.4–1.2)
HCT: 39 % (ref 36.0–46.0)
Hemoglobin: 13.3 g/dL (ref 12.0–15.0)
Potassium: 3.8 mEq/L (ref 3.5–5.1)
Sodium: 138 mEq/L (ref 135–145)
TCO2: 26 mmol/L (ref 0–100)

## 2010-11-07 LAB — URINALYSIS, ROUTINE W REFLEX MICROSCOPIC
Bilirubin Urine: NEGATIVE
Hgb urine dipstick: NEGATIVE
Ketones, ur: 15 mg/dL — AB
Specific Gravity, Urine: 1.014 (ref 1.005–1.030)
Urobilinogen, UA: 1 mg/dL (ref 0.0–1.0)
pH: 5.5 (ref 5.0–8.0)

## 2010-11-07 LAB — GLUCOSE, CAPILLARY

## 2010-11-07 LAB — URINE MICROSCOPIC-ADD ON

## 2010-11-09 LAB — URINE CULTURE

## 2010-11-15 ENCOUNTER — Emergency Department (HOSPITAL_COMMUNITY)
Admission: EM | Admit: 2010-11-15 | Discharge: 2010-11-15 | Disposition: A | Discharge status: Home / Self Care | Payer: Medicare Other | Attending: Emergency Medicine | Admitting: Emergency Medicine

## 2010-11-15 ENCOUNTER — Emergency Department (HOSPITAL_COMMUNITY): Payer: Medicare Other

## 2010-11-15 ENCOUNTER — Encounter (HOSPITAL_COMMUNITY): Payer: Self-pay | Admitting: Radiology

## 2010-11-15 DIAGNOSIS — R55 Syncope and collapse: Secondary | ICD-10-CM | POA: Insufficient documentation

## 2010-11-15 DIAGNOSIS — N390 Urinary tract infection, site not specified: Secondary | ICD-10-CM | POA: Insufficient documentation

## 2010-11-15 DIAGNOSIS — E039 Hypothyroidism, unspecified: Secondary | ICD-10-CM | POA: Insufficient documentation

## 2010-11-15 DIAGNOSIS — Z7982 Long term (current) use of aspirin: Secondary | ICD-10-CM | POA: Insufficient documentation

## 2010-11-15 DIAGNOSIS — R51 Headache: Secondary | ICD-10-CM | POA: Insufficient documentation

## 2010-11-15 DIAGNOSIS — I10 Essential (primary) hypertension: Secondary | ICD-10-CM | POA: Insufficient documentation

## 2010-11-15 DIAGNOSIS — Z79899 Other long term (current) drug therapy: Secondary | ICD-10-CM | POA: Insufficient documentation

## 2010-11-15 DIAGNOSIS — E785 Hyperlipidemia, unspecified: Secondary | ICD-10-CM | POA: Insufficient documentation

## 2010-11-15 DIAGNOSIS — Z794 Long term (current) use of insulin: Secondary | ICD-10-CM | POA: Insufficient documentation

## 2010-11-15 DIAGNOSIS — E119 Type 2 diabetes mellitus without complications: Secondary | ICD-10-CM | POA: Insufficient documentation

## 2010-11-15 HISTORY — DX: Essential (primary) hypertension: I10

## 2010-11-15 LAB — GLUCOSE, CAPILLARY: Glucose-Capillary: 127 mg/dL — ABNORMAL HIGH (ref 70–99)

## 2010-11-15 LAB — URINALYSIS, ROUTINE W REFLEX MICROSCOPIC
Bilirubin Urine: NEGATIVE
Glucose, UA: NEGATIVE mg/dL
Nitrite: POSITIVE — AB
Specific Gravity, Urine: 1.019 (ref 1.005–1.030)
pH: 6.5 (ref 5.0–8.0)

## 2010-11-15 LAB — BASIC METABOLIC PANEL
CO2: 26 mEq/L (ref 19–32)
Chloride: 100 mEq/L (ref 96–112)
GFR calc non Af Amer: >60 mL/min (ref >60)
Glucose, Bld: 123 mg/dL — ABNORMAL HIGH (ref 70–99)
Potassium: 4.1 mEq/L (ref 3.5–5.1)
Sodium: 137 mEq/L (ref 135–145)

## 2010-11-15 LAB — URINE MICROSCOPIC-ADD ON

## 2010-11-15 LAB — CBC
HCT: 36.9 % (ref 36.0–46.0)
Hemoglobin: 13 g/dL (ref 12.0–15.0)
MCV: 88.3 fL (ref 78.0–100.0)
WBC: 7.9 10*3/uL (ref 4.0–10.5)

## 2010-11-20 ENCOUNTER — Other Ambulatory Visit: Payer: Self-pay | Admitting: Internal Medicine

## 2010-11-21 ENCOUNTER — Ambulatory Visit
Admission: RE | Admit: 2010-11-21 | Discharge: 2010-11-21 | Disposition: A | Discharge status: Home / Self Care | Payer: Medicare Other | Source: Ambulatory Visit | Attending: Internal Medicine | Admitting: Internal Medicine

## 2010-11-25 ENCOUNTER — Inpatient Hospital Stay (HOSPITAL_COMMUNITY)
Admission: EM | Admit: 2010-11-25 | Discharge: 2010-11-28 | DRG: 683 | Disposition: A | Discharge status: Skilled Nursing Facility | Payer: Medicare Other | Attending: Internal Medicine | Admitting: Internal Medicine

## 2010-11-25 ENCOUNTER — Emergency Department (HOSPITAL_COMMUNITY): Payer: Medicare Other

## 2010-11-25 DIAGNOSIS — R5381 Other malaise: Secondary | ICD-10-CM | POA: Diagnosis present

## 2010-11-25 DIAGNOSIS — E860 Dehydration: Secondary | ICD-10-CM | POA: Diagnosis present

## 2010-11-25 DIAGNOSIS — Z794 Long term (current) use of insulin: Secondary | ICD-10-CM

## 2010-11-25 DIAGNOSIS — R627 Adult failure to thrive: Secondary | ICD-10-CM | POA: Diagnosis present

## 2010-11-25 DIAGNOSIS — I951 Orthostatic hypotension: Secondary | ICD-10-CM | POA: Diagnosis present

## 2010-11-25 DIAGNOSIS — E1149 Type 2 diabetes mellitus with other diabetic neurological complication: Secondary | ICD-10-CM | POA: Diagnosis present

## 2010-11-25 DIAGNOSIS — A498 Other bacterial infections of unspecified site: Secondary | ICD-10-CM | POA: Diagnosis present

## 2010-11-25 DIAGNOSIS — E872 Acidosis, unspecified: Secondary | ICD-10-CM | POA: Diagnosis present

## 2010-11-25 DIAGNOSIS — Z9181 History of falling: Secondary | ICD-10-CM

## 2010-11-25 DIAGNOSIS — F0390 Unspecified dementia without behavioral disturbance: Secondary | ICD-10-CM | POA: Diagnosis present

## 2010-11-25 DIAGNOSIS — N179 Acute kidney failure, unspecified: Principal | ICD-10-CM | POA: Diagnosis present

## 2010-11-25 DIAGNOSIS — F329 Major depressive disorder, single episode, unspecified: Secondary | ICD-10-CM | POA: Diagnosis present

## 2010-11-25 DIAGNOSIS — F3289 Other specified depressive episodes: Secondary | ICD-10-CM | POA: Diagnosis present

## 2010-11-25 DIAGNOSIS — K3184 Gastroparesis: Secondary | ICD-10-CM | POA: Diagnosis present

## 2010-11-25 DIAGNOSIS — N390 Urinary tract infection, site not specified: Secondary | ICD-10-CM | POA: Diagnosis present

## 2010-11-25 DIAGNOSIS — E875 Hyperkalemia: Secondary | ICD-10-CM | POA: Diagnosis present

## 2010-11-25 DIAGNOSIS — E039 Hypothyroidism, unspecified: Secondary | ICD-10-CM | POA: Diagnosis present

## 2010-11-25 DIAGNOSIS — Z7982 Long term (current) use of aspirin: Secondary | ICD-10-CM

## 2010-11-25 LAB — COMPREHENSIVE METABOLIC PANEL
BUN: 46 mg/dL — ABNORMAL HIGH (ref 6–23)
CO2: 16 mEq/L — ABNORMAL LOW (ref 19–32)
Calcium: 9.7 mg/dL (ref 8.4–10.5)
Creatinine, Ser: 1.31 mg/dL — ABNORMAL HIGH (ref 0.50–1.10)
GFR calc Af Amer: 48 mL/min — ABNORMAL LOW (ref >60)
GFR calc non Af Amer: 40 mL/min — ABNORMAL LOW (ref >60)
Glucose, Bld: 173 mg/dL — ABNORMAL HIGH (ref 70–99)
Total Protein: 7 g/dL (ref 6.0–8.3)

## 2010-11-25 LAB — URINALYSIS, ROUTINE W REFLEX MICROSCOPIC
Ketones, ur: 15 mg/dL — AB
Nitrite: NEGATIVE
Protein, ur: NEGATIVE mg/dL
Urobilinogen, UA: 0.2 mg/dL (ref 0.0–1.0)
pH: 5.5 (ref 5.0–8.0)

## 2010-11-25 LAB — CBC
HCT: 39.5 % (ref 36.0–46.0)
Hemoglobin: 14.3 g/dL (ref 12.0–15.0)
MCH: 32 pg (ref 26.0–34.0)
MCHC: 36.2 g/dL — ABNORMAL HIGH (ref 30.0–36.0)
RDW: 12.3 % (ref 11.5–15.5)

## 2010-11-25 LAB — CK TOTAL AND CKMB (NOT AT ARMC)
CK, MB: 1.2 ng/mL (ref 0.3–4.0)
Relative Index: INVALID (ref 0.0–2.5)
Total CK: 59 U/L (ref 7–177)

## 2010-11-25 LAB — DIFFERENTIAL
Basophils Absolute: 0 10*3/uL (ref 0.0–0.1)
Lymphs Abs: 1.5 10*3/uL (ref 0.7–4.0)
Monocytes Absolute: 1.5 10*3/uL — ABNORMAL HIGH (ref 0.1–1.0)
Monocytes Relative: 6 % (ref 3–12)
Neutro Abs: 22.4 10*3/uL — ABNORMAL HIGH (ref 1.7–7.7)
WBC Morphology: INCREASED

## 2010-11-25 LAB — POCT I-STAT, CHEM 8
Creatinine, Ser: 1.6 mg/dL — ABNORMAL HIGH (ref 0.50–1.10)
Glucose, Bld: 179 mg/dL — ABNORMAL HIGH (ref 70–99)
HCT: 42 % (ref 36.0–46.0)
Hemoglobin: 14.3 g/dL (ref 12.0–15.0)
Potassium: 4.9 mEq/L (ref 3.5–5.1)
Sodium: 129 mEq/L — ABNORMAL LOW (ref 135–145)
TCO2: 12 mmol/L (ref 0–100)

## 2010-11-26 LAB — COMPREHENSIVE METABOLIC PANEL
ALT: 33 U/L (ref 0–35)
BUN: 48 mg/dL — ABNORMAL HIGH (ref 6–23)
Calcium: 8 mg/dL — ABNORMAL LOW (ref 8.4–10.5)
GFR calc Af Amer: 53 mL/min — ABNORMAL LOW (ref >60)
Glucose, Bld: 281 mg/dL — ABNORMAL HIGH (ref 70–99)
Sodium: 132 mEq/L — ABNORMAL LOW (ref 135–145)
Total Protein: 5.8 g/dL — ABNORMAL LOW (ref 6.0–8.3)

## 2010-11-26 LAB — BASIC METABOLIC PANEL
BUN: 37 mg/dL — ABNORMAL HIGH (ref 6–23)
CO2: 27 mEq/L (ref 19–32)
Chloride: 101 mEq/L (ref 96–112)
Creatinine, Ser: 0.81 mg/dL (ref 0.50–1.10)

## 2010-11-26 LAB — GLUCOSE, CAPILLARY: Glucose-Capillary: 263 mg/dL — ABNORMAL HIGH (ref 70–99)

## 2010-11-26 LAB — DIFFERENTIAL
Basophils Relative: 0 % (ref 0–1)
Eosinophils Relative: 0 % (ref 0–5)
Monocytes Absolute: 0.9 10*3/uL (ref 0.1–1.0)
Monocytes Relative: 5 % (ref 3–12)
Neutro Abs: 13 10*3/uL — ABNORMAL HIGH (ref 1.7–7.7)

## 2010-11-26 LAB — CBC
Hemoglobin: 11.2 g/dL — ABNORMAL LOW (ref 12.0–15.0)
MCH: 31.3 pg (ref 26.0–34.0)
MCHC: 35 g/dL (ref 30.0–36.0)
RDW: 12.4 % (ref 11.5–15.5)

## 2010-11-26 LAB — VITAMIN B12: Vitamin B-12: 1770 pg/mL — ABNORMAL HIGH (ref 211–911)

## 2010-11-26 LAB — LACTATE DEHYDROGENASE: LDH: 266 U/L — ABNORMAL HIGH (ref 94–250)

## 2010-11-26 LAB — FERRITIN: Ferritin: 563 ng/mL — ABNORMAL HIGH (ref 10–291)

## 2010-11-26 LAB — IRON AND TIBC: UIBC: 201 ug/dL

## 2010-11-27 ENCOUNTER — Inpatient Hospital Stay (HOSPITAL_COMMUNITY): Payer: Medicare Other

## 2010-11-27 LAB — BASIC METABOLIC PANEL
BUN: 22 mg/dL (ref 6–23)
GFR calc Af Amer: >60 mL/min (ref >60)
GFR calc non Af Amer: >60 mL/min (ref >60)
Potassium: 3.6 mEq/L (ref 3.5–5.1)
Sodium: 136 mEq/L (ref 135–145)

## 2010-11-27 LAB — URINE CULTURE
Colony Count: NO GROWTH
Culture  Setup Time: 201206170259
Culture  Setup Time: 201206171803

## 2010-11-27 LAB — CBC
HCT: 25.3 % — ABNORMAL LOW (ref 36.0–46.0)
MCHC: 35.6 g/dL (ref 30.0–36.0)
RDW: 12.4 % (ref 11.5–15.5)

## 2010-11-27 LAB — GLUCOSE, CAPILLARY
Glucose-Capillary: 169 mg/dL — ABNORMAL HIGH (ref 70–99)
Glucose-Capillary: 114 mg/dL — ABNORMAL HIGH (ref 70–99)
Glucose-Capillary: 132 mg/dL — ABNORMAL HIGH (ref 70–99)

## 2010-11-28 LAB — GLUCOSE, CAPILLARY

## 2010-11-28 LAB — CBC
HCT: 24.9 % — ABNORMAL LOW (ref 36.0–46.0)
MCV: 87.7 fL (ref 78.0–100.0)
RBC: 2.84 MIL/uL — ABNORMAL LOW (ref 3.87–5.11)
WBC: 9 10*3/uL (ref 4.0–10.5)

## 2010-11-28 NOTE — H&P (Signed)
[name], Sheena [name] NO.:  [id number]  MEDICAL RECORD NO.:  [id number]  LOCATION:  [facility]                         FACILITY:  [facility]  PHYSICIAN:  Letha Cape, MD         DATE OF BIRTH:  1940/09/07  DATE OF ADMISSION:  11/25/2010 DATE OF DISCHARGE:                             HISTORY & PHYSICAL   HISTORY OF PRESENT ILLNESS:  A 70 year old female with past medical history of diabetes and hypertension who presents with frequent nausea and vomiting and decreased p.o. intake for the last several weeks. History is that the patient had a urinary tract infection at the beginning of June, which was treated with Bactrim.  The patient continued on the Bactrim over the last couple weeks.  During that time, she has had progressive decrease in appetite as well as nausea and vomiting.  Over the last 2 days, she has not been able to keep down very much food at all.  Simply the smell of food, sometimes we will make her feel nauseated.  After she eats, about 1-2 hours later, she will than throw up.  She has been evaluated by her primary care physician and plans were in place for her to have an upper endoscopy next week.  No etiology has yet to be determined.  Over the last 2 days, the patient has had several falls at home.  These always have occurred when going from a sitting to standing position.  The patient was witnessed one time to pass out, during this episode and then fall.  The other two falls that happened today were unwitnessed and the patient is not able to remember what exactly happened.  The patient denies any chest pain, any focal neurologic deficits.  She denies any fevers or burning with urination.  She reports having severe pain in the of the right hip, which she fell upon today.  PAST MEDICAL HISTORY: 1. Diabetes. 2. Hypothyroidism. 3. Hypertension. 4. Hyperlipidemia. 5. Recent UTI. 6. Dementia.  ALLERGIES:  Allergy to CIPROFLOXACIN which makes her  nauseous.  HOME MEDICATIONS: 1. Pantoprazole 20 mg p.o. daily. 2. Bactrim DS 1 tablet b.i.d. 3. Donepezil 10 mg p.o. daily. 4. Citalopram 10 mg p.o. daily. 5. Synthroid 88 mcg p.o. q.a.m. 6. Vytorin 10/20 mg 1 tablet at bedtime. 7. Vitamin D 1000 units p.o. daily. 8. Vitamin B12 1000 mg p.o. daily. 9. Multivitamin 1 tablet p.o. daily. 10.Metformin 1000 mg p.o. b.i.d. 11.Lisinopril 20 mg p.o. q.a.m. 12.Lantus 35 units q.a.m. 13.Fish oil 1000 mg p.o. q.a.m. 14.Calcium with vitamin D 1 tablet q.a.m. 15.Aspirin 81 mg p.o. daily.  PAST SURGICAL HISTORY:  Cochlear implants on the right.  SOCIAL HISTORY:  The patient lives alone.  No history of smoking or alcohol use.  FAMILY HISTORY:  Noncontributory.  REVIEW OF SYSTEMS:  All systems reviewed and were negative except that which was mentioned in the HPI.  PHYSICAL EXAMINATION:  VITAL SIGNS:  Temperature 97.8, blood pressure 102/57, heart rate 111, and respirations 16. GENERAL:  This is a thin elderly female in no acute distress. HEENT:  Pupils are equal, round, and reactive to light, mildly dry mucous membranes.  Normocephalic, atraumatic. CARDIOVASCULAR:  Regular rate and rhythm.  No murmurs, rubs, or gallops. LUNGS:  Clear to auscultation bilaterally. ABDOMEN:  Soft, nontender, nondistended, positive bowel sounds. EXTREMITIES:  No cyanosis, clubbing, or edema.  Good range of motion in all joints, although pain with movement in the right hip.  LABORATORY DATA:  White count 25.4, hemoglobin 14.3, hematocrit 42, platelets 259, and neutrophils 22.4.  Sodium 129, potassium 4.9, chloride 109, bicarb 16, glucose 179, BUN 47, and creatinine 1.6 (creatinine on November 15, 2010 was 0.64).  Total bilirubin 1.4, alkaline phosphatase 77, AST 30, ALT 40, total protein 7, albumin 4.4, calcium 9.7, lactic acid 2.7, CK 59, CK-MB 1.2, and troponin less than 0.3. Specific gravity 1.030, pH 5.5, ketones 15, leukocyte negative, nitrite negative,  urine culture is pending.  RADIOLOGY DATA:  X-rays of the L-spine, hip, C-spine, and chest were done showing no acute fractures.  EKG shows sinus tachycardia.  ASSESSMENT/PLAN:  A 70 year old female with diabetes, hypertension and hyperlipidemia who presents with progressive nausea and vomiting leading to prerenal azotemia and now following secondary to likely orthostatic hypotension.  1. Nausea and vomiting.  We will treat with antiemetics as needed.     Start on clear liquid diet and advance as tolerated. 2. Prerenal azotemia.  Give IV fluids to improve hydration status.  I     believe nausea, vomiting, and decreased p.o. intake are the likely     cause of this.  We will recheck renal function in the morning.  If     this is not improved, we will order renal ultrasound. 3. Falls.  No evidence of bony fracture on x-ray tonight.  We will     have PT evaluate the patient and assist with ambulation.  Pain     medications as needed for soft tissue damage secondary to falling. 4. Diabetes.  We will decrease her Lantus to 15 units at night given     her decrease in p.o. intake and she use small amounts of NovoLog     with meals until we are certain of her presumed p.o. intake.  We     will hold lisinopril given acute renal failure and dehydration.     Resume as blood pressure and kidney function tolerate. 5. Hypertension.  We will hold lisinopril, see number above. 6. Hyperlipidemia.  Continue home medications. 7. Hypothyroidism.  Continue home medications. 8. Disposition.  This patient can be discharged once able to tolerate     p.o. and with return of renal function.          ______________________________ Letha Cape, MD     RW/MEDQ  D:  11/25/2010  T:  11/25/2010  Job:  [id number]  Electronically Signed by Letha Cape MD on 11/28/2010 12:45:19 PM

## 2010-12-08 ENCOUNTER — Other Ambulatory Visit: Payer: Self-pay | Admitting: Internal Medicine

## 2010-12-08 DIAGNOSIS — M25552 Pain in left hip: Secondary | ICD-10-CM

## 2010-12-08 DIAGNOSIS — T148XXA Other injury of unspecified body region, initial encounter: Secondary | ICD-10-CM

## 2010-12-11 ENCOUNTER — Ambulatory Visit (HOSPITAL_COMMUNITY)
Admission: RE | Admit: 2010-12-11 | Discharge: 2010-12-11 | Disposition: A | Discharge status: Home / Self Care | Payer: Medicare Other | Source: Ambulatory Visit | Attending: Internal Medicine | Admitting: Internal Medicine

## 2010-12-11 DIAGNOSIS — S32509A Unspecified fracture of unspecified pubis, initial encounter for closed fracture: Secondary | ICD-10-CM | POA: Insufficient documentation

## 2010-12-11 DIAGNOSIS — M25559 Pain in unspecified hip: Secondary | ICD-10-CM | POA: Insufficient documentation

## 2010-12-11 DIAGNOSIS — M25459 Effusion, unspecified hip: Secondary | ICD-10-CM | POA: Insufficient documentation

## 2010-12-11 DIAGNOSIS — W19XXXA Unspecified fall, initial encounter: Secondary | ICD-10-CM | POA: Insufficient documentation

## 2010-12-11 DIAGNOSIS — M1610 Unilateral primary osteoarthritis, unspecified hip: Secondary | ICD-10-CM | POA: Insufficient documentation

## 2010-12-11 DIAGNOSIS — M25552 Pain in left hip: Secondary | ICD-10-CM

## 2010-12-11 DIAGNOSIS — M169 Osteoarthritis of hip, unspecified: Secondary | ICD-10-CM | POA: Insufficient documentation

## 2010-12-11 DIAGNOSIS — T148XXA Other injury of unspecified body region, initial encounter: Secondary | ICD-10-CM

## 2011-03-10 ENCOUNTER — Emergency Department (HOSPITAL_COMMUNITY)
Admission: EM | Admit: 2011-03-10 | Discharge: 2011-03-11 | Disposition: A | Discharge status: Home / Self Care | Payer: Medicare Other | Attending: Emergency Medicine | Admitting: Emergency Medicine

## 2011-03-10 DIAGNOSIS — E119 Type 2 diabetes mellitus without complications: Secondary | ICD-10-CM | POA: Insufficient documentation

## 2011-03-10 DIAGNOSIS — M545 Low back pain, unspecified: Secondary | ICD-10-CM | POA: Insufficient documentation

## 2011-03-10 DIAGNOSIS — Z79899 Other long term (current) drug therapy: Secondary | ICD-10-CM | POA: Insufficient documentation

## 2011-03-10 DIAGNOSIS — E039 Hypothyroidism, unspecified: Secondary | ICD-10-CM | POA: Insufficient documentation

## 2011-03-10 DIAGNOSIS — M25559 Pain in unspecified hip: Secondary | ICD-10-CM | POA: Insufficient documentation

## 2011-03-10 DIAGNOSIS — I10 Essential (primary) hypertension: Secondary | ICD-10-CM | POA: Insufficient documentation

## 2011-03-10 DIAGNOSIS — F0390 Unspecified dementia without behavioral disturbance: Secondary | ICD-10-CM | POA: Insufficient documentation

## 2011-03-11 ENCOUNTER — Emergency Department (HOSPITAL_COMMUNITY): Payer: Medicare Other

## 2011-03-13 LAB — COMPREHENSIVE METABOLIC PANEL
ALT: 21 U/L (ref 0–35)
Alkaline Phosphatase: 73 U/L (ref 39–117)
CO2: 27 mEq/L (ref 19–32)
GFR calc non Af Amer: >60 mL/min (ref >60)
Glucose, Bld: 185 mg/dL — ABNORMAL HIGH (ref 70–99)
Potassium: 4.4 mEq/L (ref 3.5–5.1)
Sodium: 137 mEq/L (ref 135–145)

## 2011-03-13 LAB — DIFFERENTIAL
Basophils Relative: 1 % (ref 0–1)
Eosinophils Absolute: 0.1 10*3/uL (ref 0.0–0.7)
Eosinophils Relative: 3 % (ref 0–5)
Monocytes Relative: 10 % (ref 3–12)
Neutrophils Relative %: 51 % (ref 43–77)

## 2011-03-13 LAB — CBC
Hemoglobin: 12.5 g/dL (ref 12.0–15.0)
RBC: 3.95 MIL/uL (ref 3.87–5.11)
WBC: 5.3 10*3/uL (ref 4.0–10.5)

## 2011-03-13 LAB — PROTIME-INR
INR: 1 (ref 0.00–1.49)
Prothrombin Time: 13.4 seconds (ref 11.6–15.2)

## 2011-03-16 LAB — GLUCOSE, CAPILLARY
Glucose-Capillary: 118 mg/dL — ABNORMAL HIGH (ref 70–99)
Glucose-Capillary: 231 mg/dL — ABNORMAL HIGH (ref 70–99)

## 2011-03-19 LAB — COMPREHENSIVE METABOLIC PANEL
ALT: 16
ALT: 17
AST: 20
AST: 22
Alkaline Phosphatase: 69
CO2: 22
Calcium: 8.5
Chloride: 104
Creatinine, Ser: 0.54
GFR calc Af Amer: >60
GFR calc Af Amer: >60
GFR calc non Af Amer: >60
Potassium: 3.9
Sodium: 133 — ABNORMAL LOW
Total Bilirubin: 2.2 — ABNORMAL HIGH
Total Protein: 6.5

## 2011-03-19 LAB — POCT URINALYSIS DIP (DEVICE)
Glucose, UA: NEGATIVE
Nitrite: POSITIVE — AB
Urobilinogen, UA: 0.2
pH: 7

## 2011-03-19 LAB — URINALYSIS, ROUTINE W REFLEX MICROSCOPIC
Bilirubin Urine: NEGATIVE
Glucose, UA: NEGATIVE
Glucose, UA: NEGATIVE
Ketones, ur: >80 — AB
Protein, ur: NEGATIVE
Protein, ur: NEGATIVE
Urobilinogen, UA: 1

## 2011-03-19 LAB — BASIC METABOLIC PANEL
CO2: 26
Chloride: 101
Glucose, Bld: 187 — ABNORMAL HIGH
Potassium: 3.8
Sodium: 136

## 2011-03-19 LAB — CBC
HCT: 34.9 — ABNORMAL LOW
Hemoglobin: 12
MCHC: 34.1
MCHC: 34.5
MCV: 92.7
MCV: 94.7
RBC: 3.96
RBC: 4.17
RDW: 12.9
RDW: 12.9
WBC: 8.1

## 2011-03-19 LAB — DIFFERENTIAL
Eosinophils Absolute: 0 — ABNORMAL LOW
Eosinophils Relative: 0
Lymphs Abs: 0.7
Monocytes Relative: 6

## 2011-03-19 LAB — URINE CULTURE

## 2011-03-19 LAB — URINE MICROSCOPIC-ADD ON

## 2011-03-27 LAB — COMPREHENSIVE METABOLIC PANEL
ALT: 17
AST: 19
CO2: 25
Calcium: 9
Chloride: 100
Creatinine, Ser: 0.54
GFR calc non Af Amer: >60
Glucose, Bld: 151 — ABNORMAL HIGH
Sodium: 136
Total Bilirubin: 1.7 — ABNORMAL HIGH

## 2011-03-27 LAB — PROTIME-INR: Prothrombin Time: 13.2

## 2011-03-27 LAB — CBC
Hemoglobin: 12.7
MCHC: 34
MCV: 90.8
RBC: 4.12
WBC: 8.2

## 2011-06-12 ENCOUNTER — Emergency Department (HOSPITAL_COMMUNITY): Payer: Medicare Other

## 2011-06-12 ENCOUNTER — Emergency Department (HOSPITAL_COMMUNITY)
Admission: EM | Admit: 2011-06-12 | Discharge: 2011-06-12 | Disposition: A | Discharge status: Home / Self Care | Payer: Medicare Other | Attending: Emergency Medicine | Admitting: Emergency Medicine

## 2011-06-12 ENCOUNTER — Encounter (HOSPITAL_COMMUNITY): Payer: Self-pay | Admitting: Emergency Medicine

## 2011-06-12 DIAGNOSIS — I6789 Other cerebrovascular disease: Secondary | ICD-10-CM | POA: Insufficient documentation

## 2011-06-12 DIAGNOSIS — R404 Transient alteration of awareness: Secondary | ICD-10-CM | POA: Diagnosis not present

## 2011-06-12 DIAGNOSIS — N390 Urinary tract infection, site not specified: Secondary | ICD-10-CM | POA: Diagnosis not present

## 2011-06-12 DIAGNOSIS — R4182 Altered mental status, unspecified: Secondary | ICD-10-CM | POA: Diagnosis not present

## 2011-06-12 DIAGNOSIS — E119 Type 2 diabetes mellitus without complications: Secondary | ICD-10-CM | POA: Insufficient documentation

## 2011-06-12 DIAGNOSIS — G319 Degenerative disease of nervous system, unspecified: Secondary | ICD-10-CM | POA: Diagnosis not present

## 2011-06-12 DIAGNOSIS — H5316 Psychophysical visual disturbances: Secondary | ICD-10-CM | POA: Diagnosis not present

## 2011-06-12 DIAGNOSIS — I10 Essential (primary) hypertension: Secondary | ICD-10-CM | POA: Insufficient documentation

## 2011-06-12 DIAGNOSIS — R441 Visual hallucinations: Secondary | ICD-10-CM

## 2011-06-12 DIAGNOSIS — F0390 Unspecified dementia without behavioral disturbance: Secondary | ICD-10-CM | POA: Diagnosis not present

## 2011-06-12 DIAGNOSIS — R5381 Other malaise: Secondary | ICD-10-CM | POA: Diagnosis not present

## 2011-06-12 HISTORY — DX: Unspecified dementia, unspecified severity, without behavioral disturbance, psychotic disturbance, mood disturbance, and anxiety: F03.90

## 2011-06-12 LAB — URINALYSIS, ROUTINE W REFLEX MICROSCOPIC
Bilirubin Urine: NEGATIVE
Hgb urine dipstick: NEGATIVE
Ketones, ur: NEGATIVE mg/dL
Nitrite: POSITIVE — AB
Urobilinogen, UA: 1 mg/dL (ref 0.0–1.0)

## 2011-06-12 LAB — BASIC METABOLIC PANEL
BUN: 28 mg/dL — ABNORMAL HIGH (ref 6–23)
Calcium: 9.6 mg/dL (ref 8.4–10.5)
Creatinine, Ser: 0.61 mg/dL (ref 0.50–1.10)
GFR calc Af Amer: >90 mL/min (ref >90)
GFR calc non Af Amer: 89 mL/min — ABNORMAL LOW (ref >90)
Glucose, Bld: 67 mg/dL — ABNORMAL LOW (ref 70–99)

## 2011-06-12 LAB — CBC
HCT: 34.6 % — ABNORMAL LOW (ref 36.0–46.0)
MCH: 32 pg (ref 26.0–34.0)
MCHC: 35.8 g/dL (ref 30.0–36.0)
MCV: 89.2 fL (ref 78.0–100.0)
RDW: 12.6 % (ref 11.5–15.5)

## 2011-06-12 MED ORDER — DEXTROSE 5 % IV SOLN
1.0000 g | INTRAVENOUS | Status: DC
  Administered 2011-06-12: 1 g via INTRAVENOUS
  Filled 2011-06-12: qty 10

## 2011-06-12 MED ORDER — CIPROFLOXACIN HCL 500 MG PO TABS: 500.0000 mg | ORAL_TABLET | Freq: Two times a day (BID) | ORAL | Status: AC

## 2011-06-12 NOTE — ED Notes (Signed)
CBG 107, BP 166/103
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
## 2011-06-12 NOTE — ED Notes (Signed)
ZOX:[id number]<AV> Expected date:06/12/11<BR> Expected time:<BR> Means of arrival:Ambulance<BR> Comments:<BR> M11 - 70yoF hallucinating

## 2011-06-12 NOTE — ED Provider Notes (Signed)
History     CSN: [id number]  Arrival date & time 06/12/11  1648   First MD Initiated Contact with Patient 06/12/11 1725      Chief Complaint  Patient presents with  . Altered Mental Status    Patient is a 71 y.o. female presenting with altered mental status. The history is provided by the patient and a relative. History Limited By: Dementia.  Altered Mental Status   family reports development of a visualization was today.  The patient reports she sees writing on the walls.  She denies auditory hallucinations.  She's never had a visual hallucination before.  She's had no recent medication changes.  His been no reported fever.  His been no reported vomiting or diarrhea.  The family does report our this is a change from her baseline mental status as hallucinations or not commonly part of her dementia.  She has had a dementia for 3 years.  The dura the history is obtained from the son given her dementia.  The patient is without complaints at this time  Past Medical History  Diagnosis Date  . Diabetes mellitus   . Hypertension   . Dementia     No past surgical history on file.  No family history on file.  History  Substance Use Topics  . Smoking status: Not on file  . Smokeless tobacco: Not on file  . Alcohol Use:     OB History    Grav Para Term Preterm Abortions TAB SAB Ect Mult Living                  Review of Systems  Unable to perform ROS Psychiatric/Behavioral: Positive for altered mental status.    Allergies  Bactrim  Home Medications   Current Outpatient Rx  Name Route Sig Dispense Refill  . ASPIRIN EC 81 MG PO TBEC Oral Take 81 mg by mouth daily.      Marland Kitchen BENAZEPRIL HCL 10 MG PO TABS Oral Take 10 mg by mouth daily.      Marland Kitchen CITALOPRAM HYDROBROMIDE 20 MG PO TABS Oral Take 20 mg by mouth daily.      . DONEPEZIL HCL 10 MG PO TABS Oral Take 10 mg by mouth at bedtime.      . FENTANYL 25 MCG/HR TD PT72 Transdermal Place 1 patch onto the skin every 3 (three) days.       Marland Kitchen HYDROCODONE-ACETAMINOPHEN 7.5-325 MG PO TABS Oral Take 1 tablet by mouth every 8 (eight) hours as needed. pain     . INSULIN GLARGINE 100 UNIT/ML [place] SOLN Subcutaneous Inject 10-20 Units into the skin 2 (two) times daily. Injects 10 units in the morning and 20 units at night    . LEVOTHYROXINE SODIUM 88 MCG PO TABS Oral Take 88 mcg by mouth daily.      Marland Kitchen LIDOCAINE 5 % EX PTCH Transdermal Place 1 patch onto the skin daily. Remove & Discard patch within 12 hours or as directed by MD    . METFORMIN HCL 500 MG PO TABS Oral Take 500 mg by mouth 2 (two) times daily with a meal.     . MIRTAZAPINE 7.5 MG PO TABS Oral Take 7.5 mg by mouth at bedtime.      Marland Kitchen CIPROFLOXACIN HCL 500 MG PO TABS Oral Take 1 tablet (500 mg total) by mouth 2 (two) times daily. 20 tablet 0    BP 167/65  Pulse 84  Temp(Src) 98 F (36.7 C) (Oral)  Resp 20  SpO2 100%  Physical Exam  Nursing note and vitals reviewed. Constitutional: She is oriented to person, place, and time. She appears well-developed and well-nourished. No distress.  HENT:  Head: Normocephalic and atraumatic.  Eyes: EOM are normal.  Neck: Normal range of motion.  Cardiovascular: Normal rate, regular rhythm and normal heart sounds.   Pulmonary/Chest: Effort normal and breath sounds normal.  Abdominal: Soft. She exhibits no distension. There is no tenderness.  Musculoskeletal: Normal range of motion.  Neurological: She is alert and oriented to person, place, and time.       Believes it is 2012.  She does not know the month or date or week.  She knows her name as well as her son's name.  She does know her address  Skin: Skin is warm and dry.  Psychiatric: She has a normal mood and affect. Judgment normal.    ED Course  Procedures (including critical care time)  Labs Reviewed  CBC - Abnormal; Notable for the following:    HCT 34.6 (*)    All other components within normal limits  BASIC METABOLIC PANEL - Abnormal; Notable for the following:     Glucose, Bld 67 (*)    BUN 28 (*)    GFR calc non Af Amer 89 (*)    All other components within normal limits  URINALYSIS, ROUTINE W REFLEX MICROSCOPIC - Abnormal; Notable for the following:    Nitrite POSITIVE (*)    All other components within normal limits  GLUCOSE, CAPILLARY - Abnormal; Notable for the following:    Glucose-Capillary 65 (*)    All other components within normal limits  URINE MICROSCOPIC-ADD ON - Abnormal; Notable for the following:    Bacteria, UA MANY (*)    All other components within normal limits  POCT CBG MONITORING  URINE CULTURE  POCT CBG MONITORING   Ct Head Wo Contrast  06/12/2011  *RADIOLOGY REPORT*  Clinical Data: Fall.  Altered mental status.  CT HEAD WITHOUT CONTRAST  Technique:  Contiguous axial images were obtained from the base of the skull through the vertex without contrast.  Comparison: 11/25/2010  Findings: There is mild central and cortical atrophy. Periventricular white matter changes are consistent with small vessel disease. There is no evidence for hemorrhage, mass lesion, or acute infarction.  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  Bone windows are otherwise unremarkable.  IMPRESSION:  1.  Atrophy and small vessel disease. 2. No evidence for acute intracranial abnormality. 3.  Cochlear implant.  Original Report Authenticated By: Patterson Hammersmith, M.D.     1. Visual hallucination   2. Urinary tract infection       MDM  The patient is with isolated visual hallucinations.  She has severe dementia and I suspected a urinary tract infection is caused some acute delirium.  She has family at home.  Her son is with her now.  She's been given an IV dose of Rocephin.  I suspect the patient's hallucinations will quickly resolve with treatment of her urinary tract infection.  Encourage followup with her primary care Dr. tomorrow have unable to see her primary care Dr. tomorrow to return to [facility] long emergency apartment for a repeat  evaluation tomorrow.  I don't think the patient would benefit from inpatient treatment at this time as I think that may worsen her delirium given a hospitalization in the setting of dementia.  I also do not believe this to be in acute psychiatric illness requiring hospitalizations at psychiatric facility.  The patient's posterior here in the ER was in the 60s she was given juice and food.  She's tolerated both of those her blood sugar be rechecked at this time.  I do not believe it is secondary to her low blood sugar.  Home with ciprofloxacin.  Family understands the outpatient plan although they're somewhat hesitant to implement it.  I think this is in the best interest of the patient        Lyanne Co, MD 06/12/11 2101

## 2011-06-15 LAB — URINE CULTURE: Culture  Setup Time: 201301020215

## 2011-06-16 NOTE — ED Notes (Signed)
+   Urine culture. Treated with Cipro, sensitive to same per protocol MD. 

## 2011-06-19 DIAGNOSIS — N390 Urinary tract infection, site not specified: Secondary | ICD-10-CM | POA: Diagnosis not present

## 2011-06-25 DIAGNOSIS — M76899 Other specified enthesopathies of unspecified lower limb, excluding foot: Secondary | ICD-10-CM | POA: Diagnosis not present

## 2011-06-30 ENCOUNTER — Emergency Department (HOSPITAL_COMMUNITY)
Admission: EM | Admit: 2011-06-30 | Discharge: 2011-06-30 | Disposition: A | Discharge status: Home / Self Care | Payer: Medicare Other | Attending: Emergency Medicine | Admitting: Emergency Medicine

## 2011-06-30 ENCOUNTER — Encounter (HOSPITAL_COMMUNITY): Payer: Self-pay | Admitting: Emergency Medicine

## 2011-06-30 ENCOUNTER — Emergency Department (HOSPITAL_COMMUNITY): Payer: Medicare Other

## 2011-06-30 DIAGNOSIS — F0390 Unspecified dementia without behavioral disturbance: Secondary | ICD-10-CM | POA: Insufficient documentation

## 2011-06-30 DIAGNOSIS — F19939 Other psychoactive substance use, unspecified with withdrawal, unspecified: Secondary | ICD-10-CM

## 2011-06-30 DIAGNOSIS — Z794 Long term (current) use of insulin: Secondary | ICD-10-CM | POA: Insufficient documentation

## 2011-06-30 DIAGNOSIS — E119 Type 2 diabetes mellitus without complications: Secondary | ICD-10-CM | POA: Insufficient documentation

## 2011-06-30 DIAGNOSIS — R443 Hallucinations, unspecified: Secondary | ICD-10-CM | POA: Diagnosis not present

## 2011-06-30 DIAGNOSIS — R7989 Other specified abnormal findings of blood chemistry: Secondary | ICD-10-CM | POA: Diagnosis not present

## 2011-06-30 DIAGNOSIS — R7301 Impaired fasting glucose: Secondary | ICD-10-CM | POA: Diagnosis not present

## 2011-06-30 DIAGNOSIS — H5316 Psychophysical visual disturbances: Secondary | ICD-10-CM | POA: Insufficient documentation

## 2011-06-30 DIAGNOSIS — R441 Visual hallucinations: Secondary | ICD-10-CM

## 2011-06-30 DIAGNOSIS — R440 Auditory hallucinations: Secondary | ICD-10-CM

## 2011-06-30 DIAGNOSIS — I10 Essential (primary) hypertension: Secondary | ICD-10-CM | POA: Insufficient documentation

## 2011-06-30 LAB — URINALYSIS, ROUTINE W REFLEX MICROSCOPIC
Bilirubin Urine: NEGATIVE
Glucose, UA: >1000 mg/dL — AB
Hgb urine dipstick: NEGATIVE
Protein, ur: NEGATIVE mg/dL
Specific Gravity, Urine: 1.041 — ABNORMAL HIGH (ref 1.005–1.030)
Urobilinogen, UA: 1 mg/dL (ref 0.0–1.0)

## 2011-06-30 LAB — COMPREHENSIVE METABOLIC PANEL
AST: 25 U/L (ref 0–37)
BUN: 23 mg/dL (ref 6–23)
CO2: 24 mEq/L (ref 19–32)
Calcium: 9.9 mg/dL (ref 8.4–10.5)
Chloride: 100 mEq/L (ref 96–112)
Creatinine, Ser: 0.48 mg/dL — ABNORMAL LOW (ref 0.50–1.10)
GFR calc Af Amer: >90 mL/min (ref >90)
GFR calc non Af Amer: >90 mL/min (ref >90)
Glucose, Bld: 346 mg/dL — ABNORMAL HIGH (ref 70–99)
Total Bilirubin: 0.4 mg/dL (ref 0.3–1.2)

## 2011-06-30 LAB — GLUCOSE, CAPILLARY
Glucose-Capillary: 285 mg/dL — ABNORMAL HIGH (ref 70–99)
Glucose-Capillary: 217 mg/dL — ABNORMAL HIGH (ref 70–99)

## 2011-06-30 LAB — DIFFERENTIAL
Eosinophils Relative: 1 % (ref 0–5)
Lymphocytes Relative: 25 % (ref 12–46)
Monocytes Absolute: 0.7 10*3/uL (ref 0.1–1.0)
Monocytes Relative: 11 % (ref 3–12)
Neutro Abs: 4 10*3/uL (ref 1.7–7.7)

## 2011-06-30 LAB — CBC
HCT: 35.3 % — ABNORMAL LOW (ref 36.0–46.0)
Hemoglobin: 12.6 g/dL (ref 12.0–15.0)
MCV: 90.1 fL (ref 78.0–100.0)
RDW: 12.8 % (ref 11.5–15.5)
WBC: 6.4 10*3/uL (ref 4.0–10.5)

## 2011-06-30 LAB — URINE MICROSCOPIC-ADD ON: Urine-Other: NONE SEEN

## 2011-06-30 MED ORDER — HYDROCODONE-ACETAMINOPHEN 5-325 MG PO TABS
2.0000 | ORAL_TABLET | Freq: Once | ORAL | Status: AC
  Administered 2011-06-30: 2 via ORAL
  Filled 2011-06-30: qty 2

## 2011-06-30 MED ORDER — SODIUM CHLORIDE 0.9 % IV SOLN
INTRAVENOUS | Status: DC
  Administered 2011-06-30: 14:00:00 via INTRAVENOUS

## 2011-06-30 MED ORDER — FENTANYL 25 MCG/HR TD PT72: 1.0000 | MEDICATED_PATCH | TRANSDERMAL | Status: AC

## 2011-06-30 MED ORDER — SODIUM CHLORIDE 0.9 % IV BOLUS (SEPSIS)
600.0000 mL | Freq: Once | INTRAVENOUS | Status: AC
  Administered 2011-06-30: 600 mL via INTRAVENOUS

## 2011-06-30 NOTE — ED Notes (Signed)
Pt to ED with possible UTI,pt states " I'm seeing things all over the place". Pt states that  Had same symptoms less than a month ago. Pt denies any urinary retention, pain while urinating. Pt in gown. Pt waiting on further eval

## 2011-06-30 NOTE — ED Notes (Signed)
Pt given discharge instructions and verbalizes understanding  

## 2011-06-30 NOTE — ED Notes (Signed)
[id number]<AV> Expected date:06/30/11<BR> Expected time:10:31 AM<BR> Means of arrival:Ambulance<BR> Comments:<BR>

## 2011-06-30 NOTE — ED Notes (Signed)
In/out specimen collected by [facility]. Specimen sent to lab

## 2011-06-30 NOTE — ED Provider Notes (Addendum)
History     CSN: [id number]  Arrival date & time 06/30/11  1042   First MD Initiated Contact with Patient 06/30/11 1044      Chief Complaint  Patient presents with  . Urinary Tract Infection   Level V caveat for dementia  (Consider location/radiation/quality/duration/timing/severity/associated sxs/prior treatment) HPI  History obtained from son and patient. Patient states "I'm going crazy" she relates this morning she was looking at a picture of her grandparents who have been dead 65 years and she states they were moving in the picture frame and talking to her. She called family member in [place] to report this and they called the patient's son who was at work. He states it happened about an hour ago. He states to 3 weeks ago she was seen in the ER for seeing things mainly letters on the wall which she states she has now. She states it looks like somebody has ink stamped letters on the wall. He states she was diagnosed with urinary tract infection and was given antibiotics. The son states she's never had anything like this prior to the past month. She has no history of prior psychiatric problems. He states her doctor started her on something for appetite and she actually has been eating better and is gaining weight. He states this morning he checked her blood sugar and it was 412. He states he gave her her usual morning dose of Lantus of 10 units (she takes 20 units in the evening) and he gave her an additional NovoLog 9 units subcutaneous about 10 AM.  PCP Dr. Eula Listen  Past Medical History  Diagnosis Date  . Diabetes mellitus   . Hypertension   . Dementia     No past surgical history on file. Condylar implant on the right  No family history on file.  History  Substance Use Topics  . Smoking status: no  . Smokeless tobacco: Not on file  . Alcohol Use: no  Patient's nephew lives with her Patient does not require a cane or walker to get around Patient wears hearing aid on the  left but does not appear to be extremely hard of hearing with normal conversation  OB History    Grav Para Term Preterm Abortions TAB SAB Ect Mult Living                  Review of Systems  Unable to perform ROS: Dementia    Allergies  Bactrim  Home Medications   Current Outpatient Rx  Name Route Sig Dispense Refill  . ASPIRIN EC 81 MG PO TBEC Oral Take 81 mg by mouth daily.      Marland Kitchen BENAZEPRIL HCL 10 MG PO TABS Oral Take 10 mg by mouth daily.      Marland Kitchen VITAMIN D 1000 UNITS PO TABS Oral Take 1,000 Units by mouth daily.    Marland Kitchen CITALOPRAM HYDROBROMIDE 20 MG PO TABS Oral Take 20 mg by mouth daily.      . DONEPEZIL HCL 10 MG PO TABS Oral Take 10 mg by mouth at bedtime.      . FENTANYL 25 MCG/HR TD PT72 Transdermal Place 1 patch onto the skin every 3 (three) days. 3 days ago    . OMEGA-3 FATTY ACIDS 1000 MG PO CAPS Oral Take 1 g by mouth daily.    Marland Kitchen HYDROCODONE-ACETAMINOPHEN 7.5-325 MG PO TABS Oral Take 1 tablet by mouth every 8 (eight) hours as needed. pain     . IBUPROFEN 200 MG PO TABS  Oral Take 600 mg by mouth every 6 (six) hours as needed. pain    . INSULIN ASPART 100 UNIT/ML [place] SOLN Subcutaneous Inject 9 Units into the skin 3 (three) times daily before meals. As needed    . INSULIN GLARGINE 100 UNIT/ML [place] SOLN Subcutaneous Inject 10-20 Units into the skin 2 (two) times daily. Injects 10 units in the morning and 20 units at night    . LEVOTHYROXINE SODIUM 88 MCG PO TABS Oral Take 88 mcg by mouth daily.      Marland Kitchen LIDOCAINE 5 % EX PTCH Transdermal Place 1 patch onto the skin daily. Remove & Discard patch within 12 hours or as directed by MD    . METFORMIN HCL 500 MG PO TABS Oral Take 500 mg by mouth 2 (two) times daily with a meal.     . MIRTAZAPINE 7.5 MG PO TABS Oral Take 7.5 mg by mouth at bedtime.      . ADULT MULTIVITAMIN W/MINERALS CH Oral Take 1 tablet by mouth daily.    Marland Kitchen VITAMIN C 500 MG PO TABS Oral Take 500 mg by mouth daily.      BP 154/57  Pulse 86  Temp(Src) 98 F (36.7 C)  (Oral)  Resp 16  SpO2 99% Vital signs normal except for hypertension   Physical Exam  Nursing note and vitals reviewed. Constitutional: She appears well-developed and well-nourished.  Non-toxic appearance. She does not appear ill. No distress.  HENT:  Head: Normocephalic and atraumatic.  Right Ear: External ear normal.  Left Ear: External ear normal.  Nose: Nose normal. No mucosal edema or rhinorrhea.  Mouth/Throat: Oropharynx is clear and moist and mucous membranes are normal. No dental abscesses or uvula swelling.  Eyes: Conjunctivae and EOM are normal. Pupils are equal, round, and reactive to light.  Neck: Normal range of motion and full passive range of motion without pain. Neck supple.  Cardiovascular: Normal rate, regular rhythm and normal heart sounds.  Exam reveals no gallop and no friction rub.   No murmur heard. Pulmonary/Chest: Effort normal and breath sounds normal. No respiratory distress. She has no wheezes. She has no rhonchi. She has no rales. She exhibits no tenderness and no crepitus.  Abdominal: Soft. Normal appearance and bowel sounds are normal. She exhibits no distension. There is no tenderness. There is no rebound and no guarding.  Musculoskeletal: Normal range of motion. She exhibits no edema and no tenderness.       Moves all extremities well.   Neurological: She is alert. She has normal strength. No cranial nerve deficit.       Patient has no focal neurological deficit  Skin: Skin is warm, dry and intact. No rash noted. No erythema. No pallor.  Psychiatric: She has a normal mood and affect. Her speech is normal and behavior is normal. Her mood appears not anxious.       Patient is alert, cooperative. She states she does see letters stamped on the wall    ED Course  Procedures (including critical care time)  Patient received IV fluid bolus for her hyperglycemia. Son that are giving her extra insulin at home prior to coming to the emergency department  Nurse  reports son states that he did not change patient's but no patch this morning before he left for work. He also states she has not had any of her oral pain medicine today. I am not sure if some of her hallucinations may be withdrawal from narcotics or progression of her  dementia. She also was started on something recently for appetite which the son does not know the name of. At this point I don't feel she needs hospitalization. She can be seen by her physician as an outpatient.  14:49 at discharge son told nurse they have run out of her fentanyl and need a prescription to get through the weekend.   Results for orders placed during the hospital encounter of 06/30/11  URINALYSIS, ROUTINE W REFLEX MICROSCOPIC      Component Value Range   Color, Urine YELLOW  YELLOW    APPearance CLEAR  CLEAR    Specific Gravity, Urine 1.041 (*) 1.005 - 1.030    pH 6.5  5.0 - 8.0    Glucose, UA >1000 (*) NEGATIVE (mg/dL)   Hgb urine dipstick NEGATIVE  NEGATIVE    Bilirubin Urine NEGATIVE  NEGATIVE    Ketones, ur NEGATIVE  NEGATIVE (mg/dL)   Protein, ur NEGATIVE  NEGATIVE (mg/dL)   Urobilinogen, UA 1.0  0.0 - 1.0 (mg/dL)   Nitrite NEGATIVE  NEGATIVE    Leukocytes, UA NEGATIVE  NEGATIVE   URINE MICROSCOPIC-ADD ON      Component Value Range   Urine-Other       Value: NO FORMED ELEMENTS SEEN ON URINE MICROSCOPIC EXAMINATION  CBC      Component Value Range   WBC 6.4  4.0 - 10.5 (K/uL)   RBC 3.92  3.87 - 5.11 (MIL/uL)   Hemoglobin 12.6  12.0 - 15.0 (g/dL)   HCT 40.1 (*) 02.7 - 46.0 (%)   MCV 90.1  78.0 - 100.0 (fL)   MCH 32.1  26.0 - 34.0 (pg)   MCHC 35.7  30.0 - 36.0 (g/dL)   RDW 25.3  66.4 - 40.3 (%)   Platelets 181  150 - 400 (K/uL)  DIFFERENTIAL      Component Value Range   Neutrophils Relative 64  43 - 77 (%)   Neutro Abs 4.0  1.7 - 7.7 (K/uL)   Lymphocytes Relative 25  12 - 46 (%)   Lymphs Abs 1.6  0.7 - 4.0 (K/uL)   Monocytes Relative 11  3 - 12 (%)   Monocytes Absolute 0.7  0.1 - 1.0 (K/uL)    Eosinophils Relative 1  0 - 5 (%)   Eosinophils Absolute 0.1  0.0 - 0.7 (K/uL)   Basophils Relative 0  0 - 1 (%)   Basophils Absolute 0.0  0.0 - 0.1 (K/uL)  COMPREHENSIVE METABOLIC PANEL      Component Value Range   Sodium 136  135 - 145 (mEq/L)   Potassium 4.1  3.5 - 5.1 (mEq/L)   Chloride 100  96 - 112 (mEq/L)   CO2 24  19 - 32 (mEq/L)   Glucose, Bld 346 (*) 70 - 99 (mg/dL)   BUN 23  6 - 23 (mg/dL)   Creatinine, Ser 4.74 (*) 0.50 - 1.10 (mg/dL)   Calcium 9.9  8.4 - 25.9 (mg/dL)   Total Protein 6.6  6.0 - 8.3 (g/dL)   Albumin 3.7  3.5 - 5.2 (g/dL)   AST 25  0 - 37 (U/L)   ALT 49 (*) 0 - 35 (U/L)   Alkaline Phosphatase 76  39 - 117 (U/L)   Total Bilirubin 0.4  0.3 - 1.2 (mg/dL)   GFR calc non Af Amer >90  >90 (mL/min)   GFR calc Af Amer >90  >90 (mL/min)  GLUCOSE, CAPILLARY      Component Value Range   Glucose-Capillary 285 (*)  70 - 99 (mg/dL)   Comment 1 Documented in Chart     Comment 2 Notify RN    GLUCOSE, CAPILLARY      Component Value Range   Glucose-Capillary 281 (*) 70 - 99 (mg/dL)  GLUCOSE, CAPILLARY      Component Value Range   Glucose-Capillary 228 (*) 70 - 99 (mg/dL)   Laboratory interpretation all normal except glucose urea and concentrated consistent with dehydration, her hyperglycemia is improving    Ct Head Wo Contrast  06/12/2011  *RADIOLOGY REPORT*  Clinical Data: Fall.  Altered mental status.  CT HEAD WITHOUT CONTRAST  Technique:  Contiguous axial images were obtained from the base of the skull through the vertex without contrast.  Comparison: 11/25/2010  Findings: There is mild central and cortical atrophy. Periventricular white matter changes are consistent with small vessel disease. There is no evidence for hemorrhage, mass lesion, or acute infarction.  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  Bone windows are otherwise unremarkable.  IMPRESSION:  1.  Atrophy and small vessel disease. 2. No evidence for acute intracranial abnormality. 3.   Cochlear implant.  Original Report Authenticated By: Patterson Hammersmith, M.D.     1. Visual hallucination   2. Auditory hallucinations   3. Drug withdrawal    New Prescriptions   FENTANYL (DURAGESIC) 25 MCG/HR    Place 1 patch (25 mcg total) onto the skin every 3 (three) days.     Plan discharge  Devoria Albe, MD, FACEP   MDM          Ward Givens, MD 06/30/11 1447  Ward Givens, MD 06/30/11 1451

## 2011-07-16 DIAGNOSIS — F329 Major depressive disorder, single episode, unspecified: Secondary | ICD-10-CM | POA: Diagnosis not present

## 2011-07-16 DIAGNOSIS — M810 Age-related osteoporosis without current pathological fracture: Secondary | ICD-10-CM | POA: Diagnosis not present

## 2011-07-16 DIAGNOSIS — E039 Hypothyroidism, unspecified: Secondary | ICD-10-CM | POA: Diagnosis not present

## 2011-07-16 DIAGNOSIS — M1990 Unspecified osteoarthritis, unspecified site: Secondary | ICD-10-CM | POA: Diagnosis not present

## 2011-07-16 DIAGNOSIS — G8929 Other chronic pain: Secondary | ICD-10-CM | POA: Diagnosis not present

## 2011-07-16 DIAGNOSIS — K219 Gastro-esophageal reflux disease without esophagitis: Secondary | ICD-10-CM | POA: Diagnosis not present

## 2011-07-16 DIAGNOSIS — F0390 Unspecified dementia without behavioral disturbance: Secondary | ICD-10-CM | POA: Diagnosis not present

## 2011-07-25 DIAGNOSIS — M810 Age-related osteoporosis without current pathological fracture: Secondary | ICD-10-CM | POA: Diagnosis not present

## 2011-09-13 DIAGNOSIS — E039 Hypothyroidism, unspecified: Secondary | ICD-10-CM | POA: Diagnosis not present

## 2011-09-13 DIAGNOSIS — F329 Major depressive disorder, single episode, unspecified: Secondary | ICD-10-CM | POA: Diagnosis not present

## 2011-09-13 DIAGNOSIS — M810 Age-related osteoporosis without current pathological fracture: Secondary | ICD-10-CM | POA: Diagnosis not present

## 2011-09-13 DIAGNOSIS — M549 Dorsalgia, unspecified: Secondary | ICD-10-CM | POA: Diagnosis not present

## 2011-09-13 DIAGNOSIS — F0390 Unspecified dementia without behavioral disturbance: Secondary | ICD-10-CM | POA: Diagnosis not present

## 2011-09-13 DIAGNOSIS — G8929 Other chronic pain: Secondary | ICD-10-CM | POA: Diagnosis not present

## 2011-09-13 DIAGNOSIS — E782 Mixed hyperlipidemia: Secondary | ICD-10-CM | POA: Diagnosis not present

## 2011-09-13 DIAGNOSIS — M48061 Spinal stenosis, lumbar region without neurogenic claudication: Secondary | ICD-10-CM | POA: Diagnosis not present

## 2011-09-19 ENCOUNTER — Ambulatory Visit: Payer: Medicare Other | Attending: Internal Medicine | Admitting: Physical Therapy

## 2011-09-19 DIAGNOSIS — IMO0001 Reserved for inherently not codable concepts without codable children: Secondary | ICD-10-CM | POA: Insufficient documentation

## 2011-09-19 DIAGNOSIS — R269 Unspecified abnormalities of gait and mobility: Secondary | ICD-10-CM | POA: Insufficient documentation

## 2011-09-19 DIAGNOSIS — M6281 Muscle weakness (generalized): Secondary | ICD-10-CM | POA: Diagnosis not present

## 2011-09-25 ENCOUNTER — Ambulatory Visit: Payer: Medicare Other | Admitting: Physical Therapy

## 2011-09-25 DIAGNOSIS — IMO0001 Reserved for inherently not codable concepts without codable children: Secondary | ICD-10-CM | POA: Diagnosis not present

## 2011-09-25 DIAGNOSIS — R269 Unspecified abnormalities of gait and mobility: Secondary | ICD-10-CM | POA: Diagnosis not present

## 2011-09-25 DIAGNOSIS — M6281 Muscle weakness (generalized): Secondary | ICD-10-CM | POA: Diagnosis not present

## 2011-09-27 ENCOUNTER — Ambulatory Visit: Payer: Medicare Other | Admitting: Physical Therapy

## 2011-09-27 DIAGNOSIS — R269 Unspecified abnormalities of gait and mobility: Secondary | ICD-10-CM | POA: Diagnosis not present

## 2011-09-27 DIAGNOSIS — IMO0001 Reserved for inherently not codable concepts without codable children: Secondary | ICD-10-CM | POA: Diagnosis not present

## 2011-09-27 DIAGNOSIS — M6281 Muscle weakness (generalized): Secondary | ICD-10-CM | POA: Diagnosis not present

## 2011-10-02 ENCOUNTER — Ambulatory Visit: Payer: Medicare Other | Admitting: Physical Therapy

## 2011-10-02 DIAGNOSIS — M6281 Muscle weakness (generalized): Secondary | ICD-10-CM | POA: Diagnosis not present

## 2011-10-02 DIAGNOSIS — IMO0001 Reserved for inherently not codable concepts without codable children: Secondary | ICD-10-CM | POA: Diagnosis not present

## 2011-10-02 DIAGNOSIS — R269 Unspecified abnormalities of gait and mobility: Secondary | ICD-10-CM | POA: Diagnosis not present

## 2011-10-05 ENCOUNTER — Ambulatory Visit: Payer: Medicare Other | Admitting: Physical Therapy

## 2011-10-05 DIAGNOSIS — IMO0001 Reserved for inherently not codable concepts without codable children: Secondary | ICD-10-CM | POA: Diagnosis not present

## 2011-10-05 DIAGNOSIS — M6281 Muscle weakness (generalized): Secondary | ICD-10-CM | POA: Diagnosis not present

## 2011-10-05 DIAGNOSIS — R269 Unspecified abnormalities of gait and mobility: Secondary | ICD-10-CM | POA: Diagnosis not present

## 2011-10-08 ENCOUNTER — Ambulatory Visit: Payer: Medicare Other | Admitting: Physical Therapy

## 2011-10-08 DIAGNOSIS — M6281 Muscle weakness (generalized): Secondary | ICD-10-CM | POA: Diagnosis not present

## 2011-10-08 DIAGNOSIS — IMO0001 Reserved for inherently not codable concepts without codable children: Secondary | ICD-10-CM | POA: Diagnosis not present

## 2011-10-08 DIAGNOSIS — R269 Unspecified abnormalities of gait and mobility: Secondary | ICD-10-CM | POA: Diagnosis not present

## 2011-10-10 ENCOUNTER — Ambulatory Visit: Payer: Medicare Other | Attending: Internal Medicine | Admitting: Physical Therapy

## 2011-10-10 DIAGNOSIS — IMO0001 Reserved for inherently not codable concepts without codable children: Secondary | ICD-10-CM | POA: Insufficient documentation

## 2011-10-10 DIAGNOSIS — M6281 Muscle weakness (generalized): Secondary | ICD-10-CM | POA: Diagnosis not present

## 2011-10-10 DIAGNOSIS — R269 Unspecified abnormalities of gait and mobility: Secondary | ICD-10-CM | POA: Insufficient documentation

## 2011-10-15 ENCOUNTER — Ambulatory Visit: Payer: Medicare Other | Admitting: Physical Therapy

## 2011-10-17 ENCOUNTER — Ambulatory Visit: Payer: Medicare Other | Admitting: Physical Therapy

## 2011-10-23 DIAGNOSIS — M76899 Other specified enthesopathies of unspecified lower limb, excluding foot: Secondary | ICD-10-CM | POA: Diagnosis not present

## 2011-10-23 DIAGNOSIS — M533 Sacrococcygeal disorders, not elsewhere classified: Secondary | ICD-10-CM | POA: Diagnosis not present

## 2011-11-15 DIAGNOSIS — F0390 Unspecified dementia without behavioral disturbance: Secondary | ICD-10-CM | POA: Diagnosis not present

## 2011-11-15 DIAGNOSIS — E782 Mixed hyperlipidemia: Secondary | ICD-10-CM | POA: Diagnosis not present

## 2011-11-15 DIAGNOSIS — E039 Hypothyroidism, unspecified: Secondary | ICD-10-CM | POA: Diagnosis not present

## 2011-11-15 DIAGNOSIS — G8929 Other chronic pain: Secondary | ICD-10-CM | POA: Diagnosis not present

## 2011-11-15 DIAGNOSIS — F329 Major depressive disorder, single episode, unspecified: Secondary | ICD-10-CM | POA: Diagnosis not present

## 2011-11-15 DIAGNOSIS — E119 Type 2 diabetes mellitus without complications: Secondary | ICD-10-CM | POA: Diagnosis not present

## 2011-11-15 DIAGNOSIS — R634 Abnormal weight loss: Secondary | ICD-10-CM | POA: Diagnosis not present

## 2011-11-28 ENCOUNTER — Emergency Department (HOSPITAL_COMMUNITY): Payer: Medicare Other

## 2011-11-28 ENCOUNTER — Encounter (HOSPITAL_COMMUNITY): Payer: Self-pay | Admitting: Emergency Medicine

## 2011-11-28 ENCOUNTER — Emergency Department (HOSPITAL_COMMUNITY)
Admission: EM | Admit: 2011-11-28 | Discharge: 2011-11-29 | Disposition: A | Discharge status: Home / Self Care | Payer: Medicare Other | Attending: Emergency Medicine | Admitting: Emergency Medicine

## 2011-11-28 DIAGNOSIS — M545 Low back pain, unspecified: Secondary | ICD-10-CM | POA: Diagnosis not present

## 2011-11-28 DIAGNOSIS — M8448XA Pathological fracture, other site, initial encounter for fracture: Secondary | ICD-10-CM | POA: Diagnosis not present

## 2011-11-28 DIAGNOSIS — Z794 Long term (current) use of insulin: Secondary | ICD-10-CM | POA: Insufficient documentation

## 2011-11-28 DIAGNOSIS — Z79899 Other long term (current) drug therapy: Secondary | ICD-10-CM | POA: Insufficient documentation

## 2011-11-28 DIAGNOSIS — N390 Urinary tract infection, site not specified: Secondary | ICD-10-CM

## 2011-11-28 DIAGNOSIS — F068 Other specified mental disorders due to known physiological condition: Secondary | ICD-10-CM | POA: Diagnosis not present

## 2011-11-28 DIAGNOSIS — E876 Hypokalemia: Secondary | ICD-10-CM | POA: Diagnosis not present

## 2011-11-28 DIAGNOSIS — S32000A Wedge compression fracture of unspecified lumbar vertebra, initial encounter for closed fracture: Secondary | ICD-10-CM

## 2011-11-28 DIAGNOSIS — M542 Cervicalgia: Secondary | ICD-10-CM | POA: Insufficient documentation

## 2011-11-28 DIAGNOSIS — M546 Pain in thoracic spine: Secondary | ICD-10-CM | POA: Insufficient documentation

## 2011-11-28 DIAGNOSIS — M549 Dorsalgia, unspecified: Secondary | ICD-10-CM | POA: Diagnosis not present

## 2011-11-28 DIAGNOSIS — R51 Headache: Secondary | ICD-10-CM | POA: Insufficient documentation

## 2011-11-28 DIAGNOSIS — T1490XA Injury, unspecified, initial encounter: Secondary | ICD-10-CM | POA: Diagnosis not present

## 2011-11-28 DIAGNOSIS — E1169 Type 2 diabetes mellitus with other specified complication: Secondary | ICD-10-CM | POA: Diagnosis not present

## 2011-11-28 DIAGNOSIS — S32009A Unspecified fracture of unspecified lumbar vertebra, initial encounter for closed fracture: Secondary | ICD-10-CM | POA: Insufficient documentation

## 2011-11-28 DIAGNOSIS — T148XXA Other injury of unspecified body region, initial encounter: Secondary | ICD-10-CM | POA: Diagnosis not present

## 2011-11-28 DIAGNOSIS — E119 Type 2 diabetes mellitus without complications: Secondary | ICD-10-CM | POA: Diagnosis not present

## 2011-11-28 DIAGNOSIS — Z9181 History of falling: Secondary | ICD-10-CM | POA: Diagnosis not present

## 2011-11-28 DIAGNOSIS — R7301 Impaired fasting glucose: Secondary | ICD-10-CM | POA: Diagnosis not present

## 2011-11-28 DIAGNOSIS — I700 Atherosclerosis of aorta: Secondary | ICD-10-CM | POA: Diagnosis not present

## 2011-11-28 DIAGNOSIS — I10 Essential (primary) hypertension: Secondary | ICD-10-CM | POA: Diagnosis not present

## 2011-11-28 DIAGNOSIS — E162 Hypoglycemia, unspecified: Secondary | ICD-10-CM

## 2011-11-28 DIAGNOSIS — R296 Repeated falls: Secondary | ICD-10-CM | POA: Insufficient documentation

## 2011-11-28 LAB — CBC
Hemoglobin: 11.6 g/dL — ABNORMAL LOW (ref 12.0–15.0)
MCHC: 34.7 g/dL (ref 30.0–36.0)
RDW: 13.5 % (ref 11.5–15.5)
WBC: 10.9 10*3/uL — ABNORMAL HIGH (ref 4.0–10.5)

## 2011-11-28 LAB — BASIC METABOLIC PANEL
Calcium: 9 mg/dL (ref 8.4–10.5)
GFR calc Af Amer: >90 mL/min (ref >90)
GFR calc non Af Amer: 87 mL/min — ABNORMAL LOW (ref >90)
Sodium: 139 mEq/L (ref 135–145)

## 2011-11-28 LAB — URINALYSIS, ROUTINE W REFLEX MICROSCOPIC
Leukocytes, UA: NEGATIVE
Protein, ur: NEGATIVE mg/dL
Urobilinogen, UA: 1 mg/dL (ref 0.0–1.0)

## 2011-11-28 LAB — DIFFERENTIAL
Basophils Absolute: 0 10*3/uL (ref 0.0–0.1)
Eosinophils Absolute: 0 10*3/uL (ref 0.0–0.7)
Lymphocytes Relative: 13 % (ref 12–46)
Neutrophils Relative %: 80 % — ABNORMAL HIGH (ref 43–77)

## 2011-11-28 LAB — URINE MICROSCOPIC-ADD ON

## 2011-11-28 MED ORDER — POTASSIUM CHLORIDE CRYS ER 20 MEQ PO TBCR
20.0000 meq | EXTENDED_RELEASE_TABLET | Freq: Once | ORAL | Status: AC
  Administered 2011-11-28: 20 meq via ORAL
  Filled 2011-11-28: qty 1

## 2011-11-28 MED ORDER — MORPHINE SULFATE 4 MG/ML IJ SOLN
4.0000 mg | Freq: Once | INTRAMUSCULAR | Status: AC
  Administered 2011-11-28: 4 mg via INTRAVENOUS
  Filled 2011-11-28: qty 1

## 2011-11-28 MED ORDER — GLUCOSE 40 % PO GEL
1.0000 | Freq: Once | ORAL | Status: AC
  Administered 2011-11-28: 37.5 g via ORAL
  Filled 2011-11-28: qty 1

## 2011-11-28 MED ORDER — DEXTROSE 5 % IV SOLN
1.0000 g | Freq: Once | INTRAVENOUS | Status: AC
  Administered 2011-11-29: 1 g via INTRAVENOUS
  Filled 2011-11-28: qty 10

## 2011-11-28 NOTE — ED Notes (Signed)
MD at bedside. 

## 2011-11-28 NOTE — ED Notes (Signed)
Presents to ED with c-collar and spinal board in place, pt c/o pain to right rib cage area-- onset after her fall. Pt has chronic back pain, also c/o some pain to right hip. Denies dizziness, headache or nausea.

## 2011-11-28 NOTE — ED Notes (Signed)
Patient transported to X-ray 

## 2011-11-28 NOTE — ED Provider Notes (Signed)
History     CSN: [id number]  Arrival date & time 11/28/11  1943   First MD Initiated Contact with Patient 11/28/11 2135      Chief Complaint  Patient presents with  . Fall  . Pain    (Consider location/radiation/quality/duration/timing/severity/associated sxs/prior treatment) HPI Comments: Patient was left to lie outside for approximately 15 minutes. Her walker with a proximally 5 feet from her chair. She attempted to stand and walk and fell down. No loss of consciousness. Hx of dementia  Patient is a 71 y.o. female presenting with fall. The history is provided by the patient and a relative. No language interpreter was used.  Fall The accident occurred less than 1 hour ago. The fall occurred while standing. She fell from a height of 1 to 2 ft. She landed on concrete. There was no blood loss. The point of impact was the head (right side). The pain is present in the head (back). The pain is moderate. She was not ambulatory at the scene. There was no entrapment after the fall. There was no drug use involved in the accident. There was no alcohol use involved in the accident. Pertinent negatives include no fever, no numbness, no abdominal pain, no bowel incontinence, no nausea, no vomiting, no headaches, no hearing loss, no loss of consciousness and no tingling. She has tried immobilization for the symptoms. The treatment provided no relief.    Past Medical History  Diagnosis Date  . Diabetes mellitus   . Hypertension   . Dementia     History reviewed. No pertinent past surgical history.  History reviewed. No pertinent family history.  History  Substance Use Topics  . Smoking status: Never Smoker   . Smokeless tobacco: Not on file  . Alcohol Use: No    OB History    Grav Para Term Preterm Abortions TAB SAB Ect Mult Living                  Review of Systems  Constitutional: Negative for fever, activity change, appetite change and fatigue.  HENT: Negative for congestion,  sore throat, rhinorrhea, neck pain and neck stiffness.   Respiratory: Negative for cough and shortness of breath.   Cardiovascular: Negative for chest pain and palpitations.  Gastrointestinal: Negative for nausea, vomiting, abdominal pain and bowel incontinence.  Genitourinary: Negative for dysuria, urgency, frequency and flank pain.  Musculoskeletal: Positive for back pain. Negative for myalgias and arthralgias.  Neurological: Negative for dizziness, tingling, loss of consciousness, weakness, light-headedness, numbness and headaches.  All other systems reviewed and are negative.    Allergies  Bactrim  Home Medications   Current Outpatient Rx  Name Route Sig Dispense Refill  . ASPIRIN EC 81 MG PO TBEC Oral Take 81 mg by mouth daily.      Marland Kitchen BENAZEPRIL HCL 10 MG PO TABS Oral Take 10 mg by mouth daily.      Marland Kitchen VITAMIN D 1000 UNITS PO TABS Oral Take 1,000 Units by mouth daily.    Marland Kitchen CITALOPRAM HYDROBROMIDE 20 MG PO TABS Oral Take 20 mg by mouth daily.      . DONEPEZIL HCL 10 MG PO TABS Oral Take 10 mg by mouth at bedtime.      . FENTANYL 25 MCG/HR TD PT72 Transdermal Place 1 patch onto the skin every 3 (three) days. 3 days ago    . OMEGA-3 FATTY ACIDS 1000 MG PO CAPS Oral Take 1 g by mouth daily.    . IBUPROFEN 200 MG  PO TABS Oral Take 600 mg by mouth every 6 (six) hours as needed. pain    . INSULIN GLARGINE 100 UNIT/ML [place] SOLN Subcutaneous Inject 10-20 Units into the skin 2 (two) times daily. Injects 10 units in the morning and 20 units at night    . LIDOCAINE 5 % EX PTCH Transdermal Place 1 patch onto the skin daily. Remove & Discard patch within 12 hours or as directed by MD    . METFORMIN HCL 500 MG PO TABS Oral Take 500 mg by mouth 2 (two) times daily with a meal.     . MIRTAZAPINE 7.5 MG PO TABS Oral Take 7.5 mg by mouth at bedtime.      . ADULT MULTIVITAMIN W/MINERALS CH Oral Take 1 tablet by mouth daily.    Marland Kitchen VITAMIN C 500 MG PO TABS Oral Take 500 mg by mouth daily.    . CEPHALEXIN  500 MG PO CAPS Oral Take 1 capsule (500 mg total) by mouth 4 (four) times daily. 28 capsule 0  . OXYCODONE-ACETAMINOPHEN 5-325 MG PO TABS Oral Take 1-2 tablets by mouth every 6 (six) hours as needed for pain. 20 tablet 0    BP 145/57  Pulse 84  Temp 98.7 F (37.1 C) (Oral)  Resp 18  SpO2 98%  Physical Exam  Nursing note and vitals reviewed. Constitutional: She is oriented to person, place, and time. She appears well-developed and well-nourished. No distress.  HENT:  Head: Normocephalic and atraumatic.  Mouth/Throat: Oropharynx is clear and moist. No oropharyngeal exudate.  Eyes: Conjunctivae and EOM are normal. Pupils are equal, round, and reactive to light.  Neck: Normal range of motion. Neck supple.       Cervical collar in place - clinically cleared after imaging  Cardiovascular: Normal rate, regular rhythm, normal heart sounds and intact distal pulses.  Exam reveals no gallop and no friction rub.   No murmur heard. Pulmonary/Chest: Effort normal and breath sounds normal. No respiratory distress. She exhibits no tenderness.       R chest wall tenderness  Abdominal: Soft. Bowel sounds are normal. There is no tenderness. There is no rebound and no guarding.  Musculoskeletal: Normal range of motion. She exhibits no edema.       Thoracic back: She exhibits tenderness and pain. She exhibits no bony tenderness and no spasm.       Lumbar back: She exhibits tenderness, bony tenderness and pain. She exhibits normal range of motion and no swelling.  Neurological: She is alert and oriented to person, place, and time. She has normal strength. No cranial nerve deficit or sensory deficit.  Skin: Skin is warm and dry. No rash noted.    ED Course  Procedures (including critical care time)   Date: 11/28/2011  Rate: 77  Rhythm: normal sinus rhythm  QRS Axis: normal  Intervals: normal  ST/T Wave abnormalities: normal  Conduction Disutrbances:none  Narrative Interpretation:   Old EKG  Reviewed: unchanged  Labs Reviewed  CBC - Abnormal; Notable for the following:    WBC 10.9 (*)  WHITE COUNT CONFIRMED ON SMEAR   RBC 3.51 (*)     Hemoglobin 11.6 (*)     HCT 33.4 (*)     All other components within normal limits  DIFFERENTIAL - Abnormal; Notable for the following:    Neutrophils Relative 80 (*)     Neutro Abs 8.7 (*)     All other components within normal limits  BASIC METABOLIC PANEL - Abnormal; Notable for  the following:    Potassium 3.3 (*)     Glucose, Bld 60 (*)     GFR calc non Af Amer 87 (*)     All other components within normal limits  URINALYSIS, ROUTINE W REFLEX MICROSCOPIC - Abnormal; Notable for the following:    APPearance CLOUDY (*)     Specific Gravity, Urine 1.032 (*)     Ketones, ur TRACE (*)     Nitrite POSITIVE (*)     All other components within normal limits  URINE MICROSCOPIC-ADD ON - Abnormal; Notable for the following:    Bacteria, UA MANY (*)     Crystals CA OXALATE CRYSTALS (*)     All other components within normal limits  URINE CULTURE   Dg Chest 2 View  11/28/2011  *RADIOLOGY REPORT*  Clinical Data: Status post fall; concern for chest injury.  CHEST - 2 VIEW  Comparison: Chest radiograph performed 06/30/2011  Findings: The lungs are well-aerated.  Mild left basilar opacity likely reflects atelectasis.  There is no evidence of pleural effusion or pneumothorax.  The heart is normal in size; the mediastinal contour is within normal limits.  No acute osseous abnormalities are seen.  The patient is status post vertebroplasty at the upper and mid lumbar spine.  Diffuse calcification is seen along the visualized abdominal aorta.  Postoperative change is noted about the thyroid bed.  IMPRESSION: Mild left basilar airspace opacity likely reflects atelectasis; no displaced rib fractures seen.  Original Report Authenticated By: Tonia Ghent, M.D.   Dg Thoracic Spine 2 View  11/28/2011  *RADIOLOGY REPORT*  Clinical Data: Status post fall; mid to  lower back pain.  THORACIC SPINE - 2 VIEW  Comparison: None.  Findings: There is no evidence of fracture or subluxation. Vertebral bodies demonstrate normal height and alignment. Intervertebral disc spaces are preserved.  The patient is status post vertebroplasty at L1 and L3.  Diffuse calcification is noted along the visualized abdominal aorta.  Mild degenerative change is noted along the lower cervical spine.  The visualized portions of both lungs are clear.  The mediastinum is unremarkable in appearance.  Scattered clips are noted about the thyroid bed.  IMPRESSION:  1.  No evidence of fracture or subluxation along the thoracic spine. 2.  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 3.  Diffuse calcification along the abdominal aorta.  Original Report Authenticated By: Tonia Ghent, M.D.   Dg Lumbar Spine Complete  11/28/2011  *RADIOLOGY REPORT*  Clinical Data: Fall.  Low back injury and pain.  LUMBAR SPINE - COMPLETE 4+ VIEW  Comparison: 03/11/2011  Findings: Diffuse osteopenia is again demonstrated.  Increased compression fracture of the inferior endplate of the L4 vertebral body is seen with sclerosis, indicating a subacute fracture. Previous vertebroplasties are again seen at old L1 and L3 vertebral body compression fracture sites.  Alignment remains anatomic.  Mild right-sided facet DJD seen at L5-S1.  Atherosclerotic calcification of the abdominal aorta also seen, without evidence of aneurysm.  IMPRESSION:  1.  Increased, subacute compression fracture of the and para endplate of the L4 vertebral body since prior exam of 03/11/2011. 2.  Old L1 and L3 vertebral body compression fractures. 3.  Osteopenia.  Original Report Authenticated By: Danae Orleans, M.D.   Ct Head Wo Contrast  11/28/2011  *RADIOLOGY REPORT*  Clinical Data:  Status post fall; head and neck pain.  CT HEAD WITHOUT CONTRAST AND CT CERVICAL SPINE WITHOUT CONTRAST  Technique:  Multidetector CT imaging of the head  and cervical spine was  performed following the standard protocol without intravenous contrast.  Multiplanar CT image reconstructions of the cervical spine were also generated.  Comparison: CT of the head performed 06/12/2011, and CT of the head and cervical spine performed 11/25/2010  CT HEAD  Findings: There is no evidence of acute infarction, mass lesion, or intra- or extra-axial hemorrhage on CT.  A chronic infarct is noted at the right frontoparietal region, with mild associated encephalomalacia.  The right parieto-occipital region is not well assessed due to metal artifact.  The posterior fossa, including the cerebellum, brainstem and fourth ventricle, is within normal limits.  The third and lateral ventricles, and basal ganglia are unremarkable in appearance.  The cerebral hemispheres are symmetric in appearance, with normal gray- white differentiation.  No mass effect or midline shift is seen.  There is no evidence of fracture; visualized osseous structures are unremarkable in appearance.  The visualized portions of the orbits are within normal limits.  The paranasal sinuses and left mastoid air cells are well-aerated.  The patient is status post right-sided mastoidectomy; an associated implant is noted on the right side of the head.  No significant soft tissue abnormalities are seen.  IMPRESSION:  1.  No evidence of traumatic intracranial injury or fracture. 2.  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  CT CERVICAL SPINE  Findings: There is no evidence of fracture or subluxation. Vertebral bodies demonstrate normal height and alignment.  There is mild intervertebral disc space narrowing at C5-C6, with small associated anterior and posterior disc osteophyte complexes. Prevertebral soft tissues are within normal limits.  The visualized neural foramina are grossly unremarkable.  Postoperative change is noted about the thyroid bed.  The minimally visualized lung apices are clear.   Dense calcification is seen at the carotid bifurcations, with likely severe luminal narrowing bilaterally, though particularly on the left side.  IMPRESSION:  1.  No evidence of fracture or subluxation along the cervical spine. 2.  Mild degenerative change at the lower cervical spine. 3.  Dense calcification at the carotid bifurcations, with likely severe luminal narrowing bilaterally, though particularly prominent on the left side.  Carotid ultrasound is recommended for further evaluation, when and as deemed clinically appropriate.  Original Report Authenticated By: Tonia Ghent, M.D.   Ct Cervical Spine Wo Contrast  11/28/2011  *RADIOLOGY REPORT*  Clinical Data:  Status post fall; head and neck pain.  CT HEAD WITHOUT CONTRAST AND CT CERVICAL SPINE WITHOUT CONTRAST  Technique:  Multidetector CT imaging of the head and cervical spine was performed following the standard protocol without intravenous contrast.  Multiplanar CT image reconstructions of the cervical spine were also generated.  Comparison: CT of the head performed 06/12/2011, and CT of the head and cervical spine performed 11/25/2010  CT HEAD  Findings: There is no evidence of acute infarction, mass lesion, or intra- or extra-axial hemorrhage on CT.  A chronic infarct is noted at the right frontoparietal region, with mild associated encephalomalacia.  The right parieto-occipital region is not well assessed due to metal artifact.  The posterior fossa, including the cerebellum, brainstem and fourth ventricle, is within normal limits.  The third and lateral ventricles, and basal ganglia are unremarkable in appearance.  The cerebral hemispheres are symmetric in appearance, with normal gray- white differentiation.  No mass effect or midline shift is seen.  There is no evidence of fracture; visualized osseous structures are unremarkable in appearance.  The visualized portions of the orbits are  within normal limits.  The paranasal sinuses and left mastoid  air cells are well-aerated.  The patient is status post right-sided mastoidectomy; an associated implant is noted on the right side of the head.  No significant soft tissue abnormalities are seen.  IMPRESSION:  1.  No evidence of traumatic intracranial injury or fracture. 2.  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  CT CERVICAL SPINE  Findings: There is no evidence of fracture or subluxation. Vertebral bodies demonstrate normal height and alignment.  There is mild intervertebral disc space narrowing at C5-C6, with small associated anterior and posterior disc osteophyte complexes. Prevertebral soft tissues are within normal limits.  The visualized neural foramina are grossly unremarkable.  Postoperative change is noted about the thyroid bed.  The minimally visualized lung apices are clear.  Dense calcification is seen at the carotid bifurcations, with likely severe luminal narrowing bilaterally, though particularly on the left side.  IMPRESSION:  1.  No evidence of fracture or subluxation along the cervical spine. 2.  Mild degenerative change at the lower cervical spine. 3.  Dense calcification at the carotid bifurcations, with likely severe luminal narrowing bilaterally, though particularly prominent on the left side.  Carotid ultrasound is recommended for further evaluation, when and as deemed clinically appropriate.  Original Report Authenticated By: Tonia Ghent, M.D.     1. Compression fracture of lumbar vertebra   2. UTI (lower urinary tract infection)   3. Hypoglycemia   4. Hypokalemia       MDM  Compression fracture of the lumbar spine. Remainder of her imaging is unremarkable. She also has evidence of urinary tract infection. Urine culture was sent. She received a dose of Rocephin will be discharged home on Keflex. Percocet for pain. She received a dose of potassium to replete her potassium and also received a diet for hypoglycemia. Provided  strict return precautions. Struck to followup with orthopedics in one week as well as her primary care physician. She has no neurologic compromise. Continue to take MiraLAX for stool softening.        Dayton Bailiff, MD 11/29/11 [phone]
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
## 2011-11-28 NOTE — ED Notes (Signed)
EAV:[id number]<JX> Expected date:<BR> Expected time: 7:29 PM<BR> Means of arrival:<BR> Comments:<BR> M212 - 71yoF Fall, ?syncope

## 2011-11-28 NOTE — ED Notes (Signed)
EKG old and new given to EDP, King,MD.

## 2011-11-29 MED ORDER — OXYCODONE-ACETAMINOPHEN 5-325 MG PO TABS: 1.0000 | ORAL_TABLET | Freq: Four times a day (QID) | ORAL | Status: AC | PRN

## 2011-11-29 MED ORDER — CEPHALEXIN 500 MG PO CAPS: 500.0000 mg | ORAL_CAPSULE | Freq: Four times a day (QID) | ORAL | Status: AC

## 2011-11-29 NOTE — Discharge Instructions (Signed)
Back, Compression Fracture  A compression fracture happens when a force is put upon the length of your spine. Slipping and falling on your bottom are examples of such a force. When this happens, sometimes the force is great enough to compress the building blocks (vertebral bodies) of your spine. Although this causes a lot of pain, this can usually be treated at home, unless your caregiver feels hospitalization is needed for pain control.  Your backbone (spinal column) is made up of 24 main vertebral bodies in addition to the sacrum and coccyx (see illustration). These are held together by tough fibrous tissues (ligaments) and by support of your muscles. Nerve roots pass through the openings between the vertebrae. A sudden wrenching move, injury, or a fall may cause a compression fracture of one of the vertebral bodies. This may result in back pain or spread of pain into the belly (abdomen), the buttocks, and down the leg into the foot. Pain may also be created by muscle spasm alone.  Large studies have been undertaken to determine the best possible course of action to help your back following injury and also to prevent future problems. The recommendations are as follows.  FOLLOWING A COMPRESSION FRACTURE:  Do the following only if advised by your caregiver.    If a back brace has been suggested or provided, wear it as directed.   DO NOT stop wearing the back brace unless instructed by your caregiver.   When allowed to return to regular activities, avoid a sedentary life style. Actively exercise. Sporadic weekend binges of tennis, racquetball, water skiing, may actually aggravate or create problems, especially if you are not in condition for that activity.   Avoid sports requiring sudden body movements until you are in condition for them. Swimming and walking are safer activities.   Maintain good posture.   Avoid obesity.   If not already done, you should have a DEXA scan. Based on the results, be treated for  osteoporosis.  FOLLOWING ACUTE (SUDDEN) INJURY:   Only take over-the-counter or prescription medicines for pain, discomfort, or fever as directed by your caregiver.   Use bed rest for only the most extreme acute episode. Prolonged bed rest may aggravate your condition. Ice used for acute conditions is effective. Use a large plastic bag filled with ice. Wrap it in a towel. This also provides excellent pain relief. This may be continuous. Or use it for 30 minutes every 2 hours during acute phase, then as needed. Heat for 30 minutes prior to activities is helpful.   As soon as the acute phase (the time when your back is too painful for you to do normal activities) is over, it is important to resume normal activities and work hardening programs. Back injuries can cause potentially marked changes in lifestyle. So it is important to attack these problems aggressively.   See your caregiver for continued problems. He or she can help or refer you for appropriate exercises, physical therapy and work hardening if needed.   If you are given narcotic medications for your condition, for the next 24 hours DO NOT:   Drive   Operate machinery or power tools.   Sign legal documents.   DO NOT drink alcohol, take sleeping pills or other medications that may interfere with treatment.  If your caregiver has given you a follow-up appointment, it is very important to keep that appointment. Not keeping the appointment could result in a chronic or permanent injury, pain, and disability. If there is any   back to this facility for assistance.  SEEK IMMEDIATE MEDICAL CARE IF:  You develop numbness, tingling, weakness, or problems with the use of your arms or legs.   You develop severe back pain not relieved with medications.   You have changes in bowel or bladder control.   You have increasing pain in any areas of the body.  Document Released: 05/28/2005  Document Revised: 05/17/2011 Document Reviewed: 12/31/2007 [facility] Patient Information 2012 [place], [place].  Urinary Tract Infection Infections of the urinary tract can start in several places. A bladder infection (cystitis), a kidney infection (pyelonephritis), and a prostate infection (prostatitis) are different types of urinary tract infections (UTIs). They usually get better if treated with medicines (antibiotics) that kill germs. Take all the medicine until it is gone. You or your child may feel better in a few days, but TAKE ALL MEDICINE or the infection may not respond and may become more difficult to treat. HOME CARE INSTRUCTIONS   Drink enough water and fluids to keep the urine clear or pale yellow. Cranberry juice is especially recommended, in addition to large amounts of water.   Avoid caffeine, tea, and carbonated beverages. They tend to irritate the bladder.   Alcohol may irritate the prostate.   Only take over-the-counter or prescription medicines for pain, discomfort, or fever as directed by your caregiver.  To prevent further infections:  Empty the bladder often. Avoid holding urine for long periods of time.   After a bowel movement, women should cleanse from front to back. Use each tissue only once.   Empty the bladder before and after sexual intercourse.  FINDING OUT THE RESULTS OF YOUR TEST Not all test results are available during your visit. If your or your child's test results are not back during the visit, make an appointment with your caregiver to find out the results. Do not assume everything is normal if you have not heard from your caregiver or the medical facility. It is important for you to follow up on all test results. SEEK MEDICAL CARE IF:   There is back pain.   Your baby is older than 3 months with a rectal temperature of 100.5 F (38.1 C) or higher for more than 1 day.   Your or your child's problems (symptoms) are no better in 3 days. Return sooner  if you or your child is getting worse.  SEEK IMMEDIATE MEDICAL CARE IF:   There is severe back pain or lower abdominal pain.   You or your child develops chills.   You have a fever.   Your baby is older than 3 months with a rectal temperature of 102 F (38.9 C) or higher.   Your baby is 67 months old or younger with a rectal temperature of 100.4 F (38 C) or higher.   There is nausea or vomiting.   There is continued burning or discomfort with urination.  MAKE SURE YOU:   Understand these instructions.   Will watch your condition.   Will get help right away if you are not doing well or get worse.  Document Released: 03/07/2005 Document Revised: 05/17/2011 Document Reviewed: 10/10/2006 [facility] Patient Information 2012 [place], [place].

## 2011-12-01 LAB — URINE CULTURE: Culture  Setup Time: 201306200649

## 2011-12-02 NOTE — ED Notes (Signed)
+  Urine. Patient treated with Keflex. Sensitive to same. Per protocol MD. °

## 2011-12-04 DIAGNOSIS — M545 Low back pain: Secondary | ICD-10-CM | POA: Diagnosis not present

## 2012-01-16 DIAGNOSIS — F0390 Unspecified dementia without behavioral disturbance: Secondary | ICD-10-CM | POA: Diagnosis not present

## 2012-01-16 DIAGNOSIS — K219 Gastro-esophageal reflux disease without esophagitis: Secondary | ICD-10-CM | POA: Diagnosis not present

## 2012-01-16 DIAGNOSIS — E119 Type 2 diabetes mellitus without complications: Secondary | ICD-10-CM | POA: Diagnosis not present

## 2012-01-16 DIAGNOSIS — F329 Major depressive disorder, single episode, unspecified: Secondary | ICD-10-CM | POA: Diagnosis not present

## 2012-01-16 DIAGNOSIS — E039 Hypothyroidism, unspecified: Secondary | ICD-10-CM | POA: Diagnosis not present

## 2012-01-16 DIAGNOSIS — E782 Mixed hyperlipidemia: Secondary | ICD-10-CM | POA: Diagnosis not present

## 2012-03-06 DIAGNOSIS — Z23 Encounter for immunization: Secondary | ICD-10-CM | POA: Diagnosis not present

## 2012-03-06 DIAGNOSIS — F0390 Unspecified dementia without behavioral disturbance: Secondary | ICD-10-CM | POA: Diagnosis not present

## 2012-03-06 DIAGNOSIS — F329 Major depressive disorder, single episode, unspecified: Secondary | ICD-10-CM | POA: Diagnosis not present

## 2012-03-18 DIAGNOSIS — N390 Urinary tract infection, site not specified: Secondary | ICD-10-CM | POA: Diagnosis not present

## 2012-03-20 DIAGNOSIS — M533 Sacrococcygeal disorders, not elsewhere classified: Secondary | ICD-10-CM | POA: Diagnosis not present

## 2012-08-06 ENCOUNTER — Other Ambulatory Visit: Payer: Self-pay | Admitting: Internal Medicine

## 2012-08-06 DIAGNOSIS — E782 Mixed hyperlipidemia: Secondary | ICD-10-CM | POA: Diagnosis not present

## 2012-08-06 DIAGNOSIS — Z0000 Encounter for general adult medical examination without abnormal findings: Secondary | ICD-10-CM | POA: Diagnosis not present

## 2012-08-06 DIAGNOSIS — Z1331 Encounter for screening for depression: Secondary | ICD-10-CM | POA: Diagnosis not present

## 2012-08-06 DIAGNOSIS — E119 Type 2 diabetes mellitus without complications: Secondary | ICD-10-CM | POA: Diagnosis not present

## 2012-08-06 DIAGNOSIS — I10 Essential (primary) hypertension: Secondary | ICD-10-CM | POA: Diagnosis not present

## 2012-08-06 DIAGNOSIS — F329 Major depressive disorder, single episode, unspecified: Secondary | ICD-10-CM | POA: Diagnosis not present

## 2012-08-06 DIAGNOSIS — K219 Gastro-esophageal reflux disease without esophagitis: Secondary | ICD-10-CM | POA: Diagnosis not present

## 2012-09-04 ENCOUNTER — Ambulatory Visit
Admission: RE | Admit: 2012-09-04 | Discharge: 2012-09-04 | Disposition: A | Discharge status: Home / Self Care | Payer: Medicare Other | Source: Ambulatory Visit | Attending: Internal Medicine | Admitting: Internal Medicine

## 2012-09-04 DIAGNOSIS — Z1231 Encounter for screening mammogram for malignant neoplasm of breast: Secondary | ICD-10-CM

## 2012-09-04 DIAGNOSIS — E039 Hypothyroidism, unspecified: Secondary | ICD-10-CM | POA: Diagnosis not present

## 2012-11-06 DIAGNOSIS — M47817 Spondylosis without myelopathy or radiculopathy, lumbosacral region: Secondary | ICD-10-CM | POA: Diagnosis not present

## 2012-11-24 DIAGNOSIS — M545 Low back pain: Secondary | ICD-10-CM | POA: Diagnosis not present

## 2012-12-16 ENCOUNTER — Encounter (HOSPITAL_COMMUNITY): Payer: Self-pay | Admitting: Radiology

## 2012-12-16 ENCOUNTER — Emergency Department (HOSPITAL_COMMUNITY): Payer: Medicare Other

## 2012-12-16 ENCOUNTER — Inpatient Hospital Stay (HOSPITAL_COMMUNITY)
Admission: EM | Admit: 2012-12-16 | Discharge: 2012-12-23 | DRG: 637 | Disposition: A | Discharge status: Home Health | Payer: Medicare Other | Attending: Internal Medicine | Admitting: Internal Medicine

## 2012-12-16 DIAGNOSIS — F329 Major depressive disorder, single episode, unspecified: Secondary | ICD-10-CM | POA: Diagnosis present

## 2012-12-16 DIAGNOSIS — R9389 Abnormal findings on diagnostic imaging of other specified body structures: Secondary | ICD-10-CM | POA: Diagnosis not present

## 2012-12-16 DIAGNOSIS — R4182 Altered mental status, unspecified: Secondary | ICD-10-CM | POA: Diagnosis not present

## 2012-12-16 DIAGNOSIS — G9341 Metabolic encephalopathy: Secondary | ICD-10-CM | POA: Diagnosis present

## 2012-12-16 DIAGNOSIS — G8929 Other chronic pain: Secondary | ICD-10-CM | POA: Diagnosis present

## 2012-12-16 DIAGNOSIS — R5381 Other malaise: Secondary | ICD-10-CM | POA: Diagnosis not present

## 2012-12-16 DIAGNOSIS — F0391 Unspecified dementia with behavioral disturbance: Secondary | ICD-10-CM | POA: Diagnosis present

## 2012-12-16 DIAGNOSIS — R410 Disorientation, unspecified: Secondary | ICD-10-CM

## 2012-12-16 DIAGNOSIS — E872 Acidosis: Secondary | ICD-10-CM

## 2012-12-16 DIAGNOSIS — IMO0002 Reserved for concepts with insufficient information to code with codable children: Secondary | ICD-10-CM

## 2012-12-16 DIAGNOSIS — E860 Dehydration: Secondary | ICD-10-CM | POA: Diagnosis present

## 2012-12-16 DIAGNOSIS — K3184 Gastroparesis: Secondary | ICD-10-CM | POA: Diagnosis not present

## 2012-12-16 DIAGNOSIS — Z66 Do not resuscitate: Secondary | ICD-10-CM | POA: Diagnosis present

## 2012-12-16 DIAGNOSIS — R404 Transient alteration of awareness: Secondary | ICD-10-CM

## 2012-12-16 DIAGNOSIS — F0390 Unspecified dementia without behavioral disturbance: Secondary | ICD-10-CM | POA: Diagnosis present

## 2012-12-16 DIAGNOSIS — E1310 Other specified diabetes mellitus with ketoacidosis without coma: Secondary | ICD-10-CM | POA: Diagnosis not present

## 2012-12-16 DIAGNOSIS — E876 Hypokalemia: Secondary | ICD-10-CM | POA: Diagnosis not present

## 2012-12-16 DIAGNOSIS — D649 Anemia, unspecified: Secondary | ICD-10-CM | POA: Diagnosis not present

## 2012-12-16 DIAGNOSIS — E119 Type 2 diabetes mellitus without complications: Secondary | ICD-10-CM | POA: Diagnosis present

## 2012-12-16 DIAGNOSIS — Z87898 Personal history of other specified conditions: Secondary | ICD-10-CM

## 2012-12-16 DIAGNOSIS — E039 Hypothyroidism, unspecified: Secondary | ICD-10-CM | POA: Diagnosis not present

## 2012-12-16 DIAGNOSIS — Z794 Long term (current) use of insulin: Secondary | ICD-10-CM

## 2012-12-16 DIAGNOSIS — I10 Essential (primary) hypertension: Secondary | ICD-10-CM | POA: Diagnosis present

## 2012-12-16 DIAGNOSIS — Z79899 Other long term (current) drug therapy: Secondary | ICD-10-CM

## 2012-12-16 DIAGNOSIS — R6889 Other general symptoms and signs: Secondary | ICD-10-CM | POA: Diagnosis not present

## 2012-12-16 DIAGNOSIS — E1110 Type 2 diabetes mellitus with ketoacidosis without coma: Secondary | ICD-10-CM | POA: Diagnosis not present

## 2012-12-16 DIAGNOSIS — F29 Unspecified psychosis not due to a substance or known physiological condition: Secondary | ICD-10-CM

## 2012-12-16 DIAGNOSIS — Z7982 Long term (current) use of aspirin: Secondary | ICD-10-CM | POA: Diagnosis not present

## 2012-12-16 DIAGNOSIS — F03918 Unspecified dementia, unspecified severity, with other behavioral disturbance: Secondary | ICD-10-CM | POA: Diagnosis not present

## 2012-12-16 DIAGNOSIS — E1143 Type 2 diabetes mellitus with diabetic autonomic (poly)neuropathy: Secondary | ICD-10-CM | POA: Diagnosis present

## 2012-12-16 LAB — COMPREHENSIVE METABOLIC PANEL
ALT: 13 U/L (ref 0–35)
Alkaline Phosphatase: 80 U/L (ref 39–117)
CO2: 13 mEq/L — ABNORMAL LOW (ref 19–32)
GFR calc Af Amer: 62 mL/min — ABNORMAL LOW (ref >90)
GFR calc non Af Amer: 54 mL/min — ABNORMAL LOW (ref >90)
Glucose, Bld: 199 mg/dL — ABNORMAL HIGH (ref 70–99)
Potassium: 4.1 mEq/L (ref 3.5–5.1)
Sodium: 138 mEq/L (ref 135–145)
Total Protein: 6.7 g/dL (ref 6.0–8.3)

## 2012-12-16 LAB — URINE MICROSCOPIC-ADD ON

## 2012-12-16 LAB — SALICYLATE LEVEL: Salicylate Lvl: <2 mg/dL — ABNORMAL LOW (ref 2.8–20.0)

## 2012-12-16 LAB — CBC WITH DIFFERENTIAL/PLATELET
Basophils Absolute: 0 10*3/uL (ref 0.0–0.1)
Basophils Relative: 1 % (ref 0–1)
Eosinophils Absolute: 0 10*3/uL (ref 0.0–0.7)
HCT: 39.2 % (ref 36.0–46.0)
MCHC: 34.4 g/dL (ref 30.0–36.0)
Monocytes Absolute: 1 10*3/uL (ref 0.1–1.0)
Neutro Abs: 5.8 10*3/uL (ref 1.7–7.7)
RDW: 13.6 % (ref 11.5–15.5)

## 2012-12-16 LAB — URINALYSIS, ROUTINE W REFLEX MICROSCOPIC
Ketones, ur: >80 mg/dL — AB
Nitrite: NEGATIVE
Protein, ur: 100 mg/dL — AB
Specific Gravity, Urine: 1.019 (ref 1.005–1.030)
Urobilinogen, UA: 1 mg/dL (ref 0.0–1.0)

## 2012-12-16 LAB — POCT I-STAT 3, ART BLOOD GAS (G3+)
Acid-base deficit: 15 mmol/L — ABNORMAL HIGH (ref 0.0–2.0)
O2 Saturation: 97 %
Patient temperature: 99.7

## 2012-12-16 LAB — POCT I-STAT TROPONIN I: Troponin i, poc: 0.01 ng/mL (ref 0.00–0.08)

## 2012-12-16 LAB — ACETAMINOPHEN LEVEL: Acetaminophen (Tylenol), Serum: <15 ug/mL (ref 10–30)

## 2012-12-16 MED ORDER — ASPIRIN EC 81 MG PO TBEC
81.0000 mg | DELAYED_RELEASE_TABLET | Freq: Every day | ORAL | Status: DC
  Filled 2012-12-16: qty 1

## 2012-12-16 MED ORDER — ADULT MULTIVITAMIN W/MINERALS CH
1.0000 | ORAL_TABLET | Freq: Every day | ORAL | Status: DC
  Filled 2012-12-16: qty 1

## 2012-12-16 MED ORDER — IBUPROFEN 600 MG PO TABS
600.0000 mg | ORAL_TABLET | Freq: Four times a day (QID) | ORAL | Status: DC | PRN
  Filled 2012-12-16: qty 1

## 2012-12-16 MED ORDER — LIDOCAINE 5 % EX PTCH
1.0000 | MEDICATED_PATCH | CUTANEOUS | Status: DC
  Administered 2012-12-17 – 2012-12-23 (×6): 1 via TRANSDERMAL
  Filled 2012-12-16 (×7): qty 1

## 2012-12-16 MED ORDER — MIRTAZAPINE 7.5 MG PO TABS
7.5000 mg | ORAL_TABLET | Freq: Every day | ORAL | Status: DC
Start: 2012-12-17 — End: 2012-12-17
  Administered 2012-12-17: 7.5 mg via ORAL
  Filled 2012-12-16 (×2): qty 1

## 2012-12-16 MED ORDER — DONEPEZIL HCL 10 MG PO TABS
10.0000 mg | ORAL_TABLET | Freq: Every day | ORAL | Status: DC
  Administered 2012-12-17: 10 mg via ORAL
  Filled 2012-12-16 (×2): qty 1

## 2012-12-16 MED ORDER — VITAMIN B-12 1000 MCG PO TABS
1000.0000 ug | ORAL_TABLET | Freq: Every day | ORAL | Status: DC
  Filled 2012-12-16: qty 1

## 2012-12-16 MED ORDER — CITALOPRAM HYDROBROMIDE 20 MG PO TABS
20.0000 mg | ORAL_TABLET | Freq: Every day | ORAL | Status: DC
Start: 2012-12-17 — End: 2012-12-17
  Filled 2012-12-16: qty 1

## 2012-12-16 MED ORDER — SODIUM CHLORIDE 0.9 % IV SOLN: INTRAVENOUS | Status: DC

## 2012-12-16 MED ORDER — SODIUM CHLORIDE 0.9 % IV BOLUS (SEPSIS)
500.0000 mL | Freq: Once | INTRAVENOUS | Status: AC
  Administered 2012-12-16: 500 mL via INTRAVENOUS

## 2012-12-16 MED ORDER — OMEGA-3-ACID ETHYL ESTERS 1 G PO CAPS
1.0000 g | ORAL_CAPSULE | Freq: Every day | ORAL | Status: DC
  Filled 2012-12-16: qty 1

## 2012-12-16 MED ORDER — SIMVASTATIN 10 MG PO TABS
10.0000 mg | ORAL_TABLET | Freq: Every day | ORAL | Status: DC
  Filled 2012-12-16: qty 1

## 2012-12-16 MED ORDER — LEVOTHYROXINE SODIUM 75 MCG PO TABS
75.0000 ug | ORAL_TABLET | Freq: Every day | ORAL | Status: DC
  Filled 2012-12-16 (×2): qty 1

## 2012-12-16 MED ORDER — VITAMIN C 500 MG PO TABS
500.0000 mg | ORAL_TABLET | Freq: Every day | ORAL | Status: DC
  Filled 2012-12-16: qty 1

## 2012-12-16 MED ORDER — CALCIUM CARBONATE-VITAMIN D 500-200 MG-UNIT PO TABS
1.0000 | ORAL_TABLET | Freq: Every day | ORAL | Status: DC
  Filled 2012-12-16: qty 1

## 2012-12-16 MED ORDER — DEXTROSE 50 % IV SOLN: 25.0000 mL | INTRAVENOUS | Status: DC | PRN

## 2012-12-16 MED ORDER — POTASSIUM CHLORIDE 10 MEQ/100ML IV SOLN
10.0000 meq | INTRAVENOUS | Status: AC
  Administered 2012-12-17 (×2): 10 meq via INTRAVENOUS

## 2012-12-16 MED ORDER — NALOXONE HCL 0.4 MG/ML IJ SOLN
INTRAMUSCULAR | Status: AC
  Filled 2012-12-16: qty 1

## 2012-12-16 MED ORDER — SODIUM CHLORIDE 0.9 % IV SOLN
INTRAVENOUS | Status: DC
  Administered 2012-12-17: 1.3 [IU]/h via INTRAVENOUS
  Filled 2012-12-16: qty 1

## 2012-12-16 MED ORDER — HEPARIN SODIUM (PORCINE) 5000 UNIT/ML IJ SOLN
5000.0000 [IU] | Freq: Three times a day (TID) | INTRAMUSCULAR | Status: DC
  Administered 2012-12-17 – 2012-12-23 (×17): 5000 [IU] via SUBCUTANEOUS
  Filled 2012-12-16 (×25): qty 1

## 2012-12-16 MED ORDER — SODIUM CHLORIDE 0.9 % IV BOLUS (SEPSIS)
1000.0000 mL | Freq: Once | INTRAVENOUS | Status: AC
  Administered 2012-12-16: 1000 mL via INTRAVENOUS

## 2012-12-16 MED ORDER — VITAMIN D3 25 MCG (1000 UNIT) PO TABS
1000.0000 [IU] | ORAL_TABLET | Freq: Every day | ORAL | Status: DC
  Filled 2012-12-16: qty 1

## 2012-12-16 MED ORDER — DEXTROSE-NACL 5-0.45 % IV SOLN
INTRAVENOUS | Status: DC
  Administered 2012-12-17: 02:00:00 via INTRAVENOUS

## 2012-12-16 MED ORDER — SODIUM CHLORIDE 0.9 % IV SOLN
INTRAVENOUS | Status: DC
  Administered 2012-12-17: 01:00:00 via INTRAVENOUS

## 2012-12-16 MED ORDER — CALCITONIN (SALMON) 200 UNIT/ACT NA SOLN
1.0000 | Freq: Every day | NASAL | Status: DC
  Administered 2012-12-17 – 2012-12-23 (×4): 1 via NASAL
  Filled 2012-12-16 (×2): qty 3.7

## 2012-12-16 NOTE — Progress Notes (Signed)
ABG resulted in critical value. PH 7.16. RBV to Bebe Shaggy, MD at 2152.

## 2012-12-16 NOTE — ED Notes (Signed)
Fentanyl patch removed per Dr. Bebe Shaggy-- disposed of in red needle box in room -- witnessed by Magda Paganini, [place]

## 2012-12-16 NOTE — ED Notes (Signed)
To ED via [facility]-from home with son c/o increasing lethargy. Has had diarrhea and decreased intake for 2-3 days. Today was unable to get pt to arouse when he got to the house. On arrival-- pt lethargic, nonverbal-- does arouse to verbal and tactile stimuli-- has cochlear implant--not sure if hearing aid is working per son.

## 2012-12-16 NOTE — ED Notes (Signed)
Respiratory paged for and ABG per Dr. Bebe Shaggy.

## 2012-12-16 NOTE — ED Provider Notes (Signed)
History    CSN: [id number] Arrival date & time 12/16/12  7846  First MD Initiated Contact with Patient 12/16/12 1852     Chief Complaint  Patient presents with  . decreased LOC    (Consider location/radiation/quality/duration/timing/severity/associated sxs/prior Treatment) Patient is a 72 y.o. female presenting with altered mental status. The history is provided by the patient and a relative. The history is limited by the condition of the patient.  Altered Mental Status Severity:  Moderate Most recent episode:  More than 2 days ago Timing:  Constant Progression:  Worsening Chronicity:  New Associated symptoms: vomiting   Associated symptoms: no fever   Associated symptoms comment:  Diarrhea  pt presents from home She has h/o dementia and chronic pain (on fentanyl patch) She lives with son (he is POA) He reports he left town last Thursday in the care of family and she was at baseline Upon his return on Sunday (two days ago) she has had diarrhea, decreased PO and vomiting No fever No falls She has been confused and sleepy per son   Son reports she has verbally stated that she is DNR/DNI Past Medical History  Diagnosis Date  . Diabetes mellitus   . Hypertension   . Dementia    No past surgical history on file. No family history on file. History  Substance Use Topics  . Smoking status: Never Smoker   . Smokeless tobacco: Not on file  . Alcohol Use: No   OB History   Grav Para Term Preterm Abortions TAB SAB Ect Mult Living                 Review of Systems  Unable to perform ROS: Mental status change  Constitutional: Negative for fever.  Gastrointestinal: Positive for vomiting.  Psychiatric/Behavioral: Positive for altered mental status.    Allergies  Bactrim  Home Medications   Current Outpatient Rx  Name  Route  Sig  Dispense  Refill  . aspirin EC 81 MG tablet   Oral   Take 81 mg by mouth daily.           . benazepril (LOTENSIN) 10 MG tablet    Oral   Take 10 mg by mouth daily.           . cholecalciferol (VITAMIN D) 1000 UNITS tablet   Oral   Take 1,000 Units by mouth daily.         . citalopram (CELEXA) 20 MG tablet   Oral   Take 20 mg by mouth daily.           Marland Kitchen donepezil (ARICEPT) 10 MG tablet   Oral   Take 10 mg by mouth at bedtime.           . fentaNYL (DURAGESIC - DOSED MCG/HR) 25 MCG/HR   Transdermal   Place 1 patch onto the skin every 3 (three) days. 3 days ago         . fish oil-omega-3 fatty acids 1000 MG capsule   Oral   Take 1 g by mouth daily.         Marland Kitchen ibuprofen (ADVIL,MOTRIN) 200 MG tablet   Oral   Take 600 mg by mouth every 6 (six) hours as needed. pain         . insulin glargine (LANTUS) 100 UNIT/ML injection   Subcutaneous   Inject 10-20 Units into the skin 2 (two) times daily. Injects 10 units in the morning and 20 units at night         .  lidocaine (LIDODERM) 5 %   Transdermal   Place 1 patch onto the skin daily. Remove & Discard patch within 12 hours or as directed by MD         . metFORMIN (GLUCOPHAGE) 500 MG tablet   Oral   Take 500 mg by mouth 2 (two) times daily with a meal.          . mirtazapine (REMERON) 7.5 MG tablet   Oral   Take 7.5 mg by mouth at bedtime.           . Multiple Vitamin (MULITIVITAMIN WITH MINERALS) TABS   Oral   Take 1 tablet by mouth daily.         . vitamin C (ASCORBIC ACID) 500 MG tablet   Oral   Take 500 mg by mouth daily.          BP 127/72  Pulse 129  Temp(Src) 98.3 F (36.8 C)  Resp 9  SpO2 97% BP 141/74  Pulse 117  Temp(Src) 99.7 F (37.6 C) (Rectal)  Resp 11  SpO2 99%  Physical Exam CONSTITUTIONAL: elderly, frail, somnolent, pt smells ketotic HEAD: Normocephalic/atraumatic, no signs of trauma EYES:pupils pinpoint/equal bilaterally ENMT: Mucous membranes dry NECK: supple no meningeal signs SPINE:No bruising/crepitance/stepoffs noted to spine WU:[id number] S1/S2 noted, no murmurs/rubs/gallops  noted LUNGS: Lungs are clear to auscultation bilaterally, no apparent distress ABDOMEN: soft, nontender, no rebound or guarding GU:no cva tenderness NEURO: Pt is somnolent but easily arousable.  She will followup commands.  She quickly drifts back to sleep EXTREMITIES: pulses normal, full ROM, no deformity or signs of trauma to extremities SKIN: warm, color normal, fentanyl patch on back (removed and discarded by nurse) [facility] somnolent  ED Course  Procedures  CRITICAL CARE Performed by: Joya Gaskins Total critical care time: 32 Critical care time was exclusive of separately billable procedures and treating other patients. Critical care was necessary to treat or prevent imminent or life-threatening deterioration. Critical care was time spent personally by me on the following activities: development of treatment plan with patient and/or surrogate as well as nursing, discussions with consultants, evaluation of patient's response to treatment, examination of patient, obtaining history from patient or surrogate, ordering and performing treatments and interventions, ordering and review of laboratory studies, ordering and review of radiographic studies, pulse oximetry and re-evaluation of patient's condition.   Labs Reviewed  GLUCOSE, CAPILLARY - Abnormal; Notable for the following:    Glucose-Capillary 210 (*)    All other components within normal limits  COMPREHENSIVE METABOLIC PANEL  CBC WITH DIFFERENTIAL  URINALYSIS, ROUTINE W REFLEX MICROSCOPIC  7:30 PM Pt is here for altered mental status with recent vomiting/diarrhea.  Suspect may of this may be related to her pain meds as well.  Will rehydrate, check labs/imaging and reassess Per son who is POA, pt is DNR/DNI but does not have paperwork available 9:19 PM Pt arousable, no distress. Anion gap noted. Unclear cause though has had some diarrhea.  Rest of electrolytes unremarkable.  Will obtain CT head due to her persistent AMS 11:33  PM Ct head negative Pt still arousable IV fluids administered for significant anion gap metabolic acidosis (ph at 7.1), fluid resuscitation required while in the ED Suspect acidosis/dehydration due to diarrhea.  However pt is on metformin which could contribute to acidosis ASA level negative, not hyperglycemic.  She does have ketones in her urine and pt smells ketotic D/w family need for admission D/w hospitalist will admit patient to stepdown for close monitoring  MDM  Nursing notes including past medical history and social history reviewed and considered in documentation xrays reviewed and considered Labs/vital reviewed and considered    Date: 12/16/2012  Rate: 129  Rhythm: sinus tachycardia  QRS Axis: left  Intervals: normal  ST/T Wave abnormalities: nonspecific ST changes  Conduction Disutrbances:none  Narrative Interpretation:   Old EKG Reviewed: changes noted and rate is faster today       Joya Gaskins, MD 12/16/12 2337

## 2012-12-16 NOTE — ED Notes (Signed)
Main Lab notified to add on Salicylate and Acetaminophen.

## 2012-12-17 DIAGNOSIS — R404 Transient alteration of awareness: Secondary | ICD-10-CM | POA: Diagnosis not present

## 2012-12-17 DIAGNOSIS — F0390 Unspecified dementia without behavioral disturbance: Secondary | ICD-10-CM | POA: Diagnosis present

## 2012-12-17 DIAGNOSIS — E860 Dehydration: Secondary | ICD-10-CM | POA: Diagnosis present

## 2012-12-17 DIAGNOSIS — E876 Hypokalemia: Secondary | ICD-10-CM | POA: Diagnosis present

## 2012-12-17 DIAGNOSIS — G8929 Other chronic pain: Secondary | ICD-10-CM | POA: Diagnosis present

## 2012-12-17 DIAGNOSIS — E119 Type 2 diabetes mellitus without complications: Secondary | ICD-10-CM | POA: Diagnosis present

## 2012-12-17 DIAGNOSIS — E039 Hypothyroidism, unspecified: Secondary | ICD-10-CM | POA: Diagnosis present

## 2012-12-17 DIAGNOSIS — K3184 Gastroparesis: Secondary | ICD-10-CM | POA: Diagnosis present

## 2012-12-17 DIAGNOSIS — E872 Acidosis: Secondary | ICD-10-CM | POA: Diagnosis present

## 2012-12-17 DIAGNOSIS — Z87898 Personal history of other specified conditions: Secondary | ICD-10-CM

## 2012-12-17 DIAGNOSIS — E1110 Type 2 diabetes mellitus with ketoacidosis without coma: Secondary | ICD-10-CM | POA: Diagnosis present

## 2012-12-17 DIAGNOSIS — R4182 Altered mental status, unspecified: Secondary | ICD-10-CM | POA: Diagnosis present

## 2012-12-17 LAB — BASIC METABOLIC PANEL
BUN: 10 mg/dL (ref 6–23)
CO2: 14 mEq/L — ABNORMAL LOW (ref 19–32)
CO2: 17 mEq/L — ABNORMAL LOW (ref 19–32)
CO2: 17 mEq/L — ABNORMAL LOW (ref 19–32)
Calcium: 8.4 mg/dL (ref 8.4–10.5)
Calcium: 8.4 mg/dL (ref 8.4–10.5)
Chloride: 105 mEq/L (ref 96–112)
Chloride: 105 mEq/L (ref 96–112)
GFR calc Af Amer: 66 mL/min — ABNORMAL LOW (ref >90)
GFR calc Af Amer: 73 mL/min — ABNORMAL LOW (ref >90)
GFR calc Af Amer: 73 mL/min — ABNORMAL LOW (ref >90)
GFR calc non Af Amer: 57 mL/min — ABNORMAL LOW (ref >90)
GFR calc non Af Amer: 63 mL/min — ABNORMAL LOW (ref >90)
GFR calc non Af Amer: 69 mL/min — ABNORMAL LOW (ref >90)
GFR calc non Af Amer: 70 mL/min — ABNORMAL LOW (ref >90)
Glucose, Bld: 159 mg/dL — ABNORMAL HIGH (ref 70–99)
Glucose, Bld: 173 mg/dL — ABNORMAL HIGH (ref 70–99)
Potassium: 3.7 mEq/L (ref 3.5–5.1)
Potassium: 3.7 mEq/L (ref 3.5–5.1)
Potassium: 3.3 mEq/L — ABNORMAL LOW (ref 3.5–5.1)
Potassium: 3.2 mEq/L — ABNORMAL LOW (ref 3.5–5.1)
Sodium: 141 mEq/L (ref 135–145)
Sodium: 142 mEq/L (ref 135–145)
Sodium: 140 mEq/L (ref 135–145)
Sodium: 141 mEq/L (ref 135–145)
Sodium: 140 mEq/L (ref 135–145)

## 2012-12-17 LAB — GLUCOSE, CAPILLARY
Glucose-Capillary: 193 mg/dL — ABNORMAL HIGH (ref 70–99)
Glucose-Capillary: 174 mg/dL — ABNORMAL HIGH (ref 70–99)
Glucose-Capillary: 195 mg/dL — ABNORMAL HIGH (ref 70–99)
Glucose-Capillary: 116 mg/dL — ABNORMAL HIGH (ref 70–99)
Glucose-Capillary: 118 mg/dL — ABNORMAL HIGH (ref 70–99)
Glucose-Capillary: 154 mg/dL — ABNORMAL HIGH (ref 70–99)
Glucose-Capillary: 152 mg/dL — ABNORMAL HIGH (ref 70–99)

## 2012-12-17 LAB — MAGNESIUM: Magnesium: 1 mg/dL — ABNORMAL LOW (ref 1.5–2.5)

## 2012-12-17 LAB — PHOSPHORUS: Phosphorus: 3.6 mg/dL (ref 2.3–4.6)

## 2012-12-17 MED ORDER — BIOTENE DRY MOUTH MT LIQD
15.0000 mL | Freq: Two times a day (BID) | OROMUCOSAL | Status: DC
  Administered 2012-12-17 – 2012-12-22 (×10): 15 mL via OROMUCOSAL

## 2012-12-17 MED ORDER — SODIUM CHLORIDE 0.9 % IV SOLN
INTRAVENOUS | Status: DC
  Administered 2012-12-17 – 2012-12-18 (×2): via INTRAVENOUS

## 2012-12-17 MED ORDER — LEVOTHYROXINE SODIUM 100 MCG IV SOLR
37.5000 ug | Freq: Every day | INTRAVENOUS | Status: DC
  Administered 2012-12-17: 37.5 ug via INTRAVENOUS
  Filled 2012-12-17 (×2): qty 5

## 2012-12-17 NOTE — H&P (Signed)
[facility] History and Physical  Sheena [name]:[id number] DOB: 11/30/1940 DOA: 12/16/2012  Referring physician: ED PCP: Default, Provider, MD   Chief Complaint: Decreased LOC  HPI: Sheena [name] is a 72 y.o. female who presents to the ED with AMS.  History per son, is that patient began to get more lethargic with diarrhea onset 2 days ago, was waking up and taking all meds but significantly decreased PO intake.  Patient was not given her lantus in the evening of Monday due to low BGL, and wouldn't wake up tues morning to get this.  Because she had progressed to unresponsiveness by the time the son saw her this evening, he brought the patient in to the ED for further evaluation.  In the ED she was found to have an AG metabolic acidosis with pH of 7.15, was initially lethargic, but woke up a little amount and obeys commands although delerious after fentanyl patch removed and 0.4 narcan given.  Tm 99.7, work up demonstrated ketones in the urine and the patient was noted to smell ketotic, her BGL was only mildly elevated at 199.  No obvious source of infection and her CT head was negative.  Review of Systems: Cannot be performed due to patients AMS.  Past Medical History  Diagnosis Date  . Diabetes mellitus   . Hypertension   . Dementia    No past surgical history on file. Social History:  reports that she has never smoked. She does not have any smokeless tobacco history on file. She reports that she does not drink alcohol. Her drug history is not on file.  Allergies  Allergen Reactions  . Bactrim Nausea And Vomiting    No family history on file.  Son is alive and heathy.  Prior to Admission medications   Medication Sig Start Date End Date Taking? Authorizing Provider  aspirin EC 81 MG tablet Take 81 mg by mouth daily.     Yes Historical Provider, MD  calcitonin, salmon, (MIACALCIN/FORTICAL) 200 UNIT/ACT nasal spray Place 1 spray into the nose daily.   Yes Historical Provider,  MD  Calcium Carbonate-Vitamin D (CALCIUM-VITAMIN D) 600-200 MG-UNIT CAPS Take 1 tablet by mouth daily.   Yes Historical Provider, MD  cholecalciferol (VITAMIN D) 1000 UNITS tablet Take 1,000 Units by mouth daily.   Yes Historical Provider, MD  citalopram (CELEXA) 20 MG tablet Take 20 mg by mouth daily.     Yes Historical Provider, MD  donepezil (ARICEPT) 10 MG tablet Take 10 mg by mouth at bedtime.     Yes Historical Provider, MD  fentaNYL (DURAGESIC - DOSED MCG/HR) 50 MCG/HR Place 1 patch onto the skin every 3 (three) days.   Yes Historical Provider, MD  fish oil-omega-3 fatty acids 1000 MG capsule Take 1 g by mouth daily.   Yes Historical Provider, MD  ibuprofen (ADVIL,MOTRIN) 200 MG tablet Take 600 mg by mouth every 6 (six) hours as needed. pain   Yes Historical Provider, MD  insulin glargine (LANTUS) 100 UNIT/ML injection Inject 10-20 Units into the skin 2 (two) times daily. Injects 10 units in the morning and 20 units at night   Yes Historical Provider, MD  levothyroxine (SYNTHROID, LEVOTHROID) 75 MCG tablet Take 75 mcg by mouth daily before breakfast.   Yes Historical Provider, MD  lidocaine (LIDODERM) 5 % Place 1 patch onto the skin daily. Remove & Discard patch within 12 hours or as directed by MD   Yes Historical Provider, MD  lovastatin (MEVACOR) 20 MG tablet  Take 20 mg by mouth at bedtime.   Yes Historical Provider, MD  metFORMIN (GLUCOPHAGE) 500 MG tablet Take 500 mg by mouth 2 (two) times daily with a meal.    Yes Historical Provider, MD  mirtazapine (REMERON) 7.5 MG tablet Take 7.5 mg by mouth at bedtime.     Yes Historical Provider, MD  Multiple Vitamin (MULITIVITAMIN WITH MINERALS) TABS Take 1 tablet by mouth daily.   Yes Historical Provider, MD  vitamin B-12 (CYANOCOBALAMIN) 1000 MCG tablet Take 1,000 mcg by mouth daily.   Yes Historical Provider, MD  vitamin C (ASCORBIC ACID) 500 MG tablet Take 500 mg by mouth daily.   Yes Historical Provider, MD   Physical Exam: Filed Vitals:    12/16/12 2100 12/16/12 2228 12/16/12 2300 12/16/12 2330  BP: 160/80 159/76 143/76 141/74  Pulse: 119 114 116 117  Temp:      TempSrc:      Resp: 13 16 12 11   SpO2: 100% 100% 100% 99%     General:  NAD, resting comfortably in bed, occasionally following commands, opens eyes and looking around after receiving narcan but appears delirious and dosent recognize son.  Eyes: pinpoint pupils, reactive and EOMI  ENT: mucous membranes moist  Neck: supple w/o JVD  Cardiovascular: tachycardic w/o MRG  Respiratory: CTA B  Abdomen: soft, nt, nd, bs+  Skin: no rash nor lesion  Musculoskeletal: MAE, full ROM all 4 extremities  Psychiatric: unable to assess  Neurologic: appears awake, cannot assess further, she is MAE and will respond to noxious stimuli with both extremities. Labs on Admission:  Basic Metabolic Panel:  Recent Labs Lab 12/16/12 1937  NA 138  K 4.1  CL 99  CO2 13*  GLUCOSE 199*  BUN 17  CREATININE 1.01  CALCIUM 9.0   Liver Function Tests:  Recent Labs Lab 12/16/12 1937  AST 26  ALT 13  ALKPHOS 80  BILITOT 0.7  PROT 6.7  ALBUMIN 3.8   No results found for this basename: LIPASE, AMYLASE,  in the last 168 hours No results found for this basename: AMMONIA,  in the last 168 hours CBC:  Recent Labs Lab 12/16/12 1937  WBC 8.9  NEUTROABS 5.8  HGB 13.5  HCT 39.2  MCV 94.2  PLT 212   Cardiac Enzymes: No results found for this basename: CKTOTAL, CKMB, CKMBINDEX, TROPONINI,  in the last 168 hours  BNP (last 3 results) No results found for this basename: PROBNP,  in the last 8760 hours CBG:  Recent Labs Lab 12/16/12 1859  GLUCAP 210*    Radiological Exams on Admission: Ct Head Wo Contrast  12/16/2012   *RADIOLOGY REPORT*  Clinical Data: Altered mental status.  Symptoms beginning today.  CT HEAD WITHOUT CONTRAST  Technique:  Contiguous axial images were obtained from the base of the skull through the vertex without contrast.  Comparison:  11/28/2011  Findings: Study is technically limited due to streak artifact arising from cochlear implant device.  Diffuse cerebral atrophy. No ventricular dilatation.  Low attenuation changes in the deep white matter consistent with small vessel ischemic change.  No mass effect or midline shift.  No abnormal extra-axial fluid collections.  Gray-white matter junctions are distinct.  Basal cisterns are not effaced.  No evidence of acute intracranial hemorrhage.  Visualized mastoid air cells and paranasal sinuses are not opacified.  Postoperative changes in the right temporal bone. Stable appearance since previous study.  IMPRESSION: No acute intracranial abnormalities.   Original Report Authenticated By: Burman Nieves, M.D.  Dg Chest Portable 1 View  12/16/2012   *RADIOLOGY REPORT*  Clinical Data: Confusion  PORTABLE CHEST - 1 VIEW  Comparison: 11/28/2011  Findings: The heart size is normal.  No pleural effusion or edema. There is no airspace consolidation identified.  Scar versus atelectasis is noted in the left base. Multiple chronic appearing right posterior rib fractures are identified.  IMPRESSION:  1.  No acute cardiopulmonary abnormalities.   Original Report Authenticated By: Signa Kell, M.D.    EKG: Independently reviewed.  Assessment/Plan Principal Problem:   Ketoacidosis Active Problems:   High anion gap metabolic acidosis   Altered mental status   1. Ketoacidosis - Causing AMS, likely combination of DKA and starvation ketosis, treating with insulin gtt and glucose, BMPs to ensure closure of anion gap.  Only recent illness is diarrhea over past few days.  Per daughter she also hasnt been eating much at all for quite some time. 2. AMS - combination of acidosis and some component of narcotics from the fentanyl patch, improvement was seen after narcan administered, holding fentanyl for now.   Code Status: DNR (must indicate code status--if unknown or must be presumed, indicate  so) Family Communication: Spoke with son at bedside (indicate person spoken with, if applicable, with phone number if by telephone) Disposition Plan: Admit to inpatient (indicate anticipated LOS)  Time spent: 70 min  Margarie Mcguirt M. [facility] Pager [phone]  If 7PM-7AM, please contact night-coverage [email or web address] Password [email or web address] 12/17/2012, 12:02 AM

## 2012-12-17 NOTE — Progress Notes (Signed)
[facility] Progress Note [place] TEAM 1 - Stepdown/ICU TEAM   Sheena [name] JXB:[id number] DOB: 09-29-1940 DOA: 12/16/2012 PCP: Georgann Housekeeper, MD  Brief narrative: 72 y.o. female who presented to the ED with AMS. History per son was that patient began to become more lethargic with diarrhea onset 2 days prior to admission. He was able to wake her up and she would take her meds. She was noted with significant decreased PO intake compared to baseline. Patient was not given her Lantus on the evening of Monday due to low CBG. She was also not given her LANTUS Tuesday morning because of lethargy and poor intake. By Tuesday evening she was essentially unresponsive so he brought the patient to the ED for further evaluation.   In the ED she was found to have an AG metabolic acidosis with pH of 7.15, and was initially lethargic. She did rouse briefly after fentanyl patch was removed and 0.4 narcan given. Her Tm was 99.7 and work up demonstrated ketones in the urine (patient was noted to smell ketotic). Interestingly her CBG was only mildly elevated at 199. No obvious source of infection noted and her CT head was negative.  Assessment/Plan:    High anion gap metabolic acidosis -etiology not entirely clear -diff. Includes atypical presentation of DKA in setting of early starvation, starvation alone or other -cont sx treatment and supportive care (see below)    ?DKA (diabetic ketoacidoses) / Diabetes mellitus, type 2 -despite CBG 199 at presentation it is conceivable that in setting ot chronic progressive FTT/starvation that she could have DKA and that due to low body mass/starvation she was unable to adequately increase her CBG to high levels -AG still elevated at 18 -Stop gtt-repeat BMET at 12 p today and if gap rises will need to resume insulin gtt    Altered mental status/Metabolic encephalopathy -multifactorial due to narcs and acidosis -cont to hold narcs and npo until alert    Severe  Diabetic gastroparesis -per son she has marked delayed empty time of 12 hours -basically grazes all day and does best on shakes    H/O diarrhea/ Hypokalemia/Dehydration -no diarrhea since admit - no recent anbx's -IV replete potassium and follow lytes -cont IVF    Dementia -as above- seems to be experiencing rapid decline over past few months -baseline reported as ambulatory and able to perform ADL's mostly independent but can't remember to eat but slowly less independent past few months -worrisome that pt may be in initial stages of terminal dementia    Hypothyroidism -change to IV Synthroid while NPO    Chronic pain -? Source -hold Fentanyl patch -provide prn short acting narcs if exhibits pain or withdrawal sx's    DVT prophylaxis: Subcutaneous heparin Code Status: DO NOT RESUSCITATE per discussion w son today  Family Communication: Patient's son at bedside Disposition Plan: Remain in step down. Transfer care to primary care physician Dr. Tyson Dense on 12/18/2012  Consultants: None  Procedures: None  Antibiotics: None  HPI/Subjective: Patient unresponsive. Did briefly open eyes when physician placed stethoscope on chest but no obvious eye contact or purposeful movement noted.  Objective: Blood pressure 100/54, pulse 102, temperature 97.8 F (36.6 C), temperature source Oral, resp. rate 14, height 5\' 3"  (1.6 m), weight 48.7 kg (107 lb 5.8 oz), SpO2 100.00%.  Intake/Output Summary (Last 24 hours) at 12/17/12 1201 Last data filed at 12/17/12 0600  Gross per 24 hour  Intake 373.69 ml  Output      0 ml  Net 373.69 ml   Exam: Followup exam completed. Patient admitted at midnight today.   Scheduled Meds: Scheduled Meds: . calcitonin (salmon)  1 spray Alternating Nares Daily  . heparin  5,000 Units Subcutaneous Q8H  . levothyroxine  37.5 mcg Intravenous Daily  . lidocaine  1 patch Transdermal Q24H   Continuous Infusions: . sodium chloride 75 mL/hr at  12/17/12 1122   Data Reviewed: Basic Metabolic Panel:  Recent Labs Lab 12/16/12 1937 12/17/12 0008 12/17/12 0135 12/17/12 0355 12/17/12 0540  NA 138 141 142 140 141  K 4.1 3.7 3.7 3.6 3.3*  CL 99 103 105 105 107  CO2 13* 14* 12* 14* 17*  GLUCOSE 199* 186* 189* 199* 159*  BUN 17 14 13 12 11   CREATININE 1.01 0.96 0.89 0.89 0.82  CALCIUM 9.0 8.4 8.3* 8.2* 8.4  MG  --  1.0*  --   --   --   PHOS  --  3.6  --   --   --    Liver Function Tests:  Recent Labs Lab 12/16/12 1937  AST 26  ALT 13  ALKPHOS 80  BILITOT 0.7  PROT 6.7  ALBUMIN 3.8   CBC:  Recent Labs Lab 12/16/12 1937  WBC 8.9  NEUTROABS 5.8  HGB 13.5  HCT 39.2  MCV 94.2  PLT 212   CBG:  Recent Labs Lab 12/17/12 0143 12/17/12 0243 12/17/12 0351 12/17/12 0455 12/17/12 0559  GLUCAP 193* 174* 195* 203* 152*    Recent Results (from the past 240 hour(s))  MRSA PCR SCREENING     Status: None   Collection Time    12/16/12 11:55 PM      Result Value Range Status   MRSA by PCR NEGATIVE  NEGATIVE Final   Comment:            The GeneXpert MRSA Assay (FDA     approved for NASAL specimens     only), is one component of a     comprehensive MRSA colonization     surveillance program. It is not     intended to diagnose MRSA     infection nor to guide or     monitor treatment for     MRSA infections.     Studies:  Recent x-ray studies have been reviewed in detail by the Attending Physician    Junious Silk, ANP [facility] Office  [phone] Pager [phone]  **If unable to reach the above provider after paging please contact the Flow Manager @ [phone]  On-Call/Text Page:      [email or web address]      password [email or web address]  If 7PM-7AM, please contact night-coverage [email or web address] Password [email or web address] 12/17/2012, 12:01 PM   LOS: 1 day   I have personally examined this patient and reviewed the entire database. I have reviewed the above note, made any necessary editorial changes, and agree with its  content.  Lonia Blood, MD [facility]

## 2012-12-18 DIAGNOSIS — R4182 Altered mental status, unspecified: Secondary | ICD-10-CM | POA: Diagnosis not present

## 2012-12-18 DIAGNOSIS — E039 Hypothyroidism, unspecified: Secondary | ICD-10-CM | POA: Diagnosis not present

## 2012-12-18 DIAGNOSIS — E1110 Type 2 diabetes mellitus with ketoacidosis without coma: Secondary | ICD-10-CM | POA: Diagnosis not present

## 2012-12-18 DIAGNOSIS — E860 Dehydration: Secondary | ICD-10-CM | POA: Diagnosis not present

## 2012-12-18 LAB — CBC
MCV: 91 fL (ref 78.0–100.0)
Platelets: 115 10*3/uL — ABNORMAL LOW (ref 150–400)
RBC: 3.24 MIL/uL — ABNORMAL LOW (ref 3.87–5.11)
RDW: 13.5 % (ref 11.5–15.5)
WBC: 3.6 10*3/uL — ABNORMAL LOW (ref 4.0–10.5)

## 2012-12-18 LAB — BASIC METABOLIC PANEL
Calcium: 8.5 mg/dL (ref 8.4–10.5)
Creatinine, Ser: 0.78 mg/dL (ref 0.50–1.10)
GFR calc Af Amer: >90 mL/min (ref >90)
GFR calc non Af Amer: 81 mL/min — ABNORMAL LOW (ref >90)
Sodium: 142 mEq/L (ref 135–145)

## 2012-12-18 LAB — GLUCOSE, CAPILLARY
Glucose-Capillary: 127 mg/dL — ABNORMAL HIGH (ref 70–99)
Glucose-Capillary: 135 mg/dL — ABNORMAL HIGH (ref 70–99)
Glucose-Capillary: 140 mg/dL — ABNORMAL HIGH (ref 70–99)
Glucose-Capillary: 165 mg/dL — ABNORMAL HIGH (ref 70–99)
Glucose-Capillary: 172 mg/dL — ABNORMAL HIGH (ref 70–99)

## 2012-12-18 LAB — LACTATE DEHYDROGENASE: LDH: 179 U/L (ref 94–250)

## 2012-12-18 MED ORDER — POTASSIUM CHLORIDE 10 MEQ/50ML IV SOLN: 10.0000 meq | INTRAVENOUS | Status: DC

## 2012-12-18 MED ORDER — POTASSIUM CHLORIDE 10 MEQ/100ML IV SOLN
10.0000 meq | INTRAVENOUS | Status: AC
  Administered 2012-12-18 (×3): 10 meq via INTRAVENOUS
  Filled 2012-12-18 (×3): qty 100

## 2012-12-18 MED ORDER — CITALOPRAM HYDROBROMIDE 20 MG PO TABS
20.0000 mg | ORAL_TABLET | Freq: Every day | ORAL | Status: DC
  Administered 2012-12-18 – 2012-12-20 (×3): 20 mg via ORAL
  Filled 2012-12-18 (×5): qty 1

## 2012-12-18 MED ORDER — INSULIN ASPART 100 UNIT/ML ~~LOC~~ SOLN
0.0000 [IU] | Freq: Three times a day (TID) | SUBCUTANEOUS | Status: DC
  Administered 2012-12-18: 2 [IU] via SUBCUTANEOUS
  Administered 2012-12-18: 1 [IU] via SUBCUTANEOUS
  Administered 2012-12-19: 3 [IU] via SUBCUTANEOUS
  Administered 2012-12-19: 1 [IU] via SUBCUTANEOUS
  Administered 2012-12-20 – 2012-12-22 (×3): 2 [IU] via SUBCUTANEOUS
  Administered 2012-12-22 – 2012-12-23 (×2): 3 [IU] via SUBCUTANEOUS
  Administered 2012-12-23: 2 [IU] via SUBCUTANEOUS

## 2012-12-18 MED ORDER — DONEPEZIL HCL 10 MG PO TABS
10.0000 mg | ORAL_TABLET | Freq: Every day | ORAL | Status: DC
  Administered 2012-12-18 – 2012-12-22 (×4): 10 mg via ORAL
  Filled 2012-12-18 (×8): qty 1

## 2012-12-18 MED ORDER — POTASSIUM CHLORIDE IN NACL 20-0.9 MEQ/L-% IV SOLN
INTRAVENOUS | Status: DC
  Administered 2012-12-18: 1000 mL via INTRAVENOUS
  Filled 2012-12-18 (×4): qty 1000

## 2012-12-18 MED ORDER — MIRTAZAPINE 7.5 MG PO TABS
7.5000 mg | ORAL_TABLET | Freq: Every day | ORAL | Status: DC
  Administered 2012-12-18 – 2012-12-22 (×4): 7.5 mg via ORAL
  Filled 2012-12-18 (×7): qty 1

## 2012-12-18 MED ORDER — INSULIN GLARGINE 100 UNIT/ML ~~LOC~~ SOLN
10.0000 [IU] | Freq: Every day | SUBCUTANEOUS | Status: DC
  Administered 2012-12-18 – 2012-12-22 (×5): 10 [IU] via SUBCUTANEOUS
  Filled 2012-12-18 (×6): qty 0.1

## 2012-12-18 MED ORDER — LEVOTHYROXINE SODIUM 75 MCG PO TABS
75.0000 ug | ORAL_TABLET | Freq: Every day | ORAL | Status: DC
  Administered 2012-12-19 – 2012-12-23 (×4): 75 ug via ORAL
  Filled 2012-12-18 (×6): qty 1

## 2012-12-18 MED ORDER — GLUCERNA SHAKE PO LIQD
237.0000 mL | Freq: Two times a day (BID) | ORAL | Status: DC
  Administered 2012-12-18 – 2012-12-23 (×6): 237 mL via ORAL

## 2012-12-18 NOTE — Progress Notes (Addendum)
INITIAL NUTRITION ASSESSMENT  DOCUMENTATION CODES Per approved criteria  -Not Applicable   INTERVENTION:  Glucerna Shake twice daily (220 kcals, 9.9 gm protein per 8 fl oz bottle) RD to follow for nutrition care plan  NUTRITION DIAGNOSIS: Inadequate oral intake related to AMS, dehydration as evidenced by PO intake 0%  Goal: Oral intake with meals & supplements to meet >/= 90% of estimated nutrition needs  Monitor:  PO & supplemental intake, weight, labs, I/O's  Reason for Assessment: Consult  72 y.o. female  Admitting Dx: decreased LOC  ASSESSMENT: Patient presented to ED with AMS; per son patient began to get more lethargic with diarrhea onset 2 days PTA with significantly decreased PO intake; in ED she was found to have an AG metabolic acidosis with pH of 7.15.  RD unable to obtain nutrition hx from patient; no family at bedside; patient confused with mittens on; would benefit from addition of nutrition supplements to help meet kcals, protein intake and promote skin integrity ---> RD to order.  Height: Ht Readings from Last 1 Encounters:  12/16/12 5\' 3"  (1.6 m)    Weight: Wt Readings from Last 1 Encounters:  12/17/12 107 lb 5.8 oz (48.7 kg)    Ideal Body Weight: 115 lb  % Ideal Body Weight: 93%  Wt Readings from Last 10 Encounters:  12/17/12 107 lb 5.8 oz (48.7 kg)    Usual Body Weight: unable to obtain  % Usual Body Weight: ---  BMI:  Body mass index is 19.02 kg/(m^2).  Estimated Nutritional Needs: Kcal: 1200-1400 Protein: 60-70 gm Fluid: >/= 1.5 L  Skin: Stage I pressure ulcer to sacrum  Diet Order: Carb Control  EDUCATION NEEDS: -No education needs identified at this time   Intake/Output Summary (Last 24 hours) at 12/18/12 1506 Last data filed at 12/18/12 1400  Gross per 24 hour  Intake   1350 ml  Output    600 ml  Net    750 ml    Labs:   Recent Labs Lab 12/16/12 1937 12/17/12 0008  12/17/12 0540 12/17/12 1129 12/18/12 0425   NA 138 141  < > 141 140 142  K 4.1 3.7  < > 3.3* 3.2* 3.0*  CL 99 103  < > 107 105 106  CO2 13* 14*  < > 17* 17* 18*  BUN 17 14  < > 11 10 6   CREATININE 1.01 0.96  < > 0.82 0.81 0.78  CALCIUM 9.0 8.4  < > 8.4 8.5 8.5  MG  --  1.0*  --   --   --   --   PHOS  --  3.6  --   --   --   --   GLUCOSE 199* 186*  < > 159* 173* 141*  < > = values in this interval not displayed.  CBG (last 3)   Recent Labs  12/18/12 0006 12/18/12 0439 12/18/12 0831  GLUCAP 135* 140* 127*    Scheduled Meds: . antiseptic oral rinse  15 mL Mouth Rinse BID  . calcitonin (salmon)  1 spray Alternating Nares Daily  . citalopram  20 mg Oral Daily  . donepezil  10 mg Oral QHS  . heparin  5,000 Units Subcutaneous Q8H  . insulin aspart  0-9 Units Subcutaneous TID WC  . insulin glargine  10 Units Subcutaneous QHS  . [START ON 12/19/2012] levothyroxine  75 mcg Oral QAC breakfast  . lidocaine  1 patch Transdermal Q24H  . mirtazapine  7.5 mg Oral QHS  Continuous Infusions: . sodium chloride 75 mL/hr at 12/18/12 0010  . 0.9 % NaCl with KCl 20 mEq / L 1,000 mL (12/18/12 0848)    Past Medical History  Diagnosis Date  . Diabetes mellitus   . Hypertension   . Dementia     No past surgical history on file.  Maureen Chatters, RD, LDN Pager #: [phone] After-Hours Pager #: [phone]

## 2012-12-18 NOTE — Progress Notes (Signed)
Subjective: Pt more awake and alert; confusion at baseline She know in hospital.  Objective: Vital signs in last 24 hours: Temp:  [98 F (36.7 C)-98.2 F (36.8 C)] 98 F (36.7 C) (07/10 0640) Pulse Rate:  [80-126] 80 (07/10 0640) Resp:  [8-19] 9 (07/10 0640) BP: (100-140)/(50-76) 129/54 mmHg (07/10 0640) SpO2:  [97 %-100 %] 98 % (07/10 0023) Weight change:     Intake/Output from previous day: 07/09 0701 - 07/10 0700 In: 900 [I.V.:900] Out: 200 [Urine:200] Intake/Output this shift:    General appearance: alert Resp: clear to auscultation bilaterally Cardio: regular rate and rhythm GI: soft, non-tender; bowel sounds normal; no masses,  no organomegaly  Lab Results:  Recent Labs  12/16/12 1937 12/18/12 0425  WBC 8.9 3.6*  HGB 13.5 10.6*  HCT 39.2 29.5*  PLT 212 115*   BMET  Recent Labs  12/17/12 1129 12/18/12 0425  NA 140 142  K 3.2* 3.0*  CL 105 106  CO2 17* 18*  GLUCOSE 173* 141*  BUN 10 6  CREATININE 0.81 0.78  CALCIUM 8.5 8.5    Studies/Results: Ct Head Wo Contrast  12/16/2012   *RADIOLOGY REPORT*  Clinical Data: Altered mental status.  Symptoms beginning today.  CT HEAD WITHOUT CONTRAST  Technique:  Contiguous axial images were obtained from the base of the skull through the vertex without contrast.  Comparison: 11/28/2011  Findings: Study is technically limited due to streak artifact arising from cochlear implant device.  Diffuse cerebral atrophy. No ventricular dilatation.  Low attenuation changes in the deep white matter consistent with small vessel ischemic change.  No mass effect or midline shift.  No abnormal extra-axial fluid collections.  Gray-white matter junctions are distinct.  Basal cisterns are not effaced.  No evidence of acute intracranial hemorrhage.  Visualized mastoid air cells and paranasal sinuses are not opacified.  Postoperative changes in the right temporal bone. Stable appearance since previous study.  IMPRESSION: No acute  intracranial abnormalities.   Original Report Authenticated By: Burman Nieves, M.D.   Dg Chest Portable 1 View  12/16/2012   *RADIOLOGY REPORT*  Clinical Data: Confusion  PORTABLE CHEST - 1 VIEW  Comparison: 11/28/2011  Findings: The heart size is normal.  No pleural effusion or edema. There is no airspace consolidation identified.  Scar versus atelectasis is noted in the left base. Multiple chronic appearing right posterior rib fractures are identified.  IMPRESSION:  1.  No acute cardiopulmonary abnormalities.   Original Report Authenticated By: Signa Kell, M.D.    Medications: I have reviewed the patient's current medications.  Assessment/Plan: DKA/AGMA: improving- HCO3 better- BS ok Start on PO diet Altered MS- due to combination of Acidosis/ narcotic and dehydration- improving. Dehydration continue IVF DM- SS insulin; with lantus 10 unit at bedtime; hold metformin Poor appetite- nutrition consult Hypothyroid continue synthroid TSH ok Anemia- repeat CBC Dementia- moderate- aricept Depression restart on celexa Chronic Back  Pain/  H/o Compression fx; off Duragesic- lidoderm and tylenol;  Hypokalemia- K replacement PT consult      LOS: 2 days   Sheena [name] 12/18/2012, 7:49 AM

## 2012-12-18 NOTE — Progress Notes (Signed)
Pt son contacted nurse to inform that he had placed pt " brown" right side hearing aid on nightstand.  Nurse has constantly placed hearing aid in pt's ear, however pt continuously removes hearing aid due to confusion and dementia.  Pt is unable to hear without both hearing aides.  Nurse will continue to monitor.

## 2012-12-18 NOTE — Evaluation (Signed)
Physical Therapy Evaluation Patient Details Name: Sheena [name] MRN: [id number] DOB: 12/23/1940 Today's Date: 12/18/2012 Time: 4098-1191 PT Time Calculation (min): 24 min  PT Assessment / Plan / Recommendation History of Present Illness  Sheena [name] is a 72 y.o. female who presents to the ED with AMS.  History per son, is that patient began to get more lethargic with diarrhea onset 2 days ago, was waking up and taking all meds but significantly decreased PO intake.  Patient was not given her lantus in the evening of Monday due to low BGL, and wouldn't wake up tues morning to get this.  Because she had progressed to unresponsiveness by the time the son saw her this evening, he brought the patient in to the ED for further evaluation.  Pt found to have ketoacidosis.  Clinical Impression  Limited history taken due to impaired cognition and HOH.  Pt with LOB and staggered gait with ambulation and needs max cues for safety.  Will continue to assess best d/c plans depending on progress and caregivers availability.  Pt will benefit from acute PT services to improve overall mobility to prepare for safe d/c home with 24 hour assistance needed.     PT Assessment  Patient needs continued PT services    Follow Up Recommendations  Home health PT;Supervision/Assistance - 24 hour    Equipment Recommendations   (Need to further assess)    Frequency Min 3X/week    Precautions / Restrictions Precautions Precautions: Fall   Pertinent Vitals/Pain Did not state      Mobility  Bed Mobility Bed Mobility: Supine to Sit;Sit to Supine Supine to Sit: 4: Min assist;HOB flat Sit to Supine: 4: Min assist;HOB flat Details for Bed Mobility Assistance: (A) to safely elevate trunk OOB and (A) with LE into bed.  Transfers Transfers: Sit to Stand;Stand to Sit Sit to Stand: 4: Min assist;From bed Stand to Sit: 4: Min assist;To bed Details for Transfer Assistance: (A) to initiate transfer and slowly descend to  chair with cues for hand placement Ambulation/Gait Ambulation/Gait Assistance: 4: Min assist Ambulation Distance (Feet): 100 Feet Assistive device: 2 person hand held assist Ambulation/Gait Assistance Details: (A) to maintain balance.  Pt with staggered gait and mulitple LOB.  Pt with occasional bilateral LE knee buckling and needs (A) to maintian upright posture. Gait Pattern: Step-through pattern;Decreased stride length;Scissoring Gait velocity: decreased Stairs: No Wheelchair Mobility Wheelchair Mobility: No    Exercises     PT Diagnosis: Difficulty walking;Generalized weakness;Abnormality of gait  PT Problem List: Decreased strength;Decreased activity tolerance;Decreased balance;Decreased mobility;Decreased cognition;Decreased knowledge of use of DME;Decreased safety awareness;Decreased knowledge of precautions PT Treatment Interventions: DME instruction;Gait training;Functional mobility training;Therapeutic activities;Stair training;Therapeutic exercise;Balance training;Cognitive remediation;Patient/family education     PT Goals(Current goals can be found in the care plan section) Acute Rehab PT Goals Patient Stated Goal: Unable to set PT Goal Formulation: Patient unable to participate in goal setting Time For Goal Achievement: 12/25/12 Potential to Achieve Goals: Good  Visit Information  Last PT Received On: 12/18/12 Assistance Needed: +1 History of Present Illness: Sheena [name] is a 72 y.o. female who presents to the ED with AMS.  History per son, is that patient began to get more lethargic with diarrhea onset 2 days ago, was waking up and taking all meds but significantly decreased PO intake.  Patient was not given her lantus in the evening of Monday due to low BGL, and wouldn't wake up tues morning to get this.  Because she had progressed  to unresponsiveness by the time the son saw her this evening, he brought the patient in to the ED for further evaluation.  Pt found to have  ketoacidosis.       Prior Functioning  Home Living Family/patient expects to be discharged to:: Private residence Living Arrangements: Children;Other relatives Additional Comments: No family present and pt unable to completely answer questions due to cognition and HOH. Prior Function Comments: Pt is a poor historian and no family present to get PLOF. Communication Communication: HOH    Cognition  Cognition Arousal/Alertness: Awake/alert Behavior During Therapy: Restless Overall Cognitive Status: No family/caregiver present to determine baseline cognitive functioning (Pt follows commands with increase time.) Area of Impairment: Awareness;Problem solving Awareness: Intellectual Problem Solving: Slow processing    Extremity/Trunk Assessment Lower Extremity Assessment Lower Extremity Assessment: Generalized weakness   Balance Balance Balance Assessed: Yes Static Sitting Balance Static Sitting - Balance Support: Feet supported;Bilateral upper extremity supported Static Sitting - Level of Assistance: 5: Stand by assistance Static Standing Balance Static Standing - Balance Support: Bilateral upper extremity supported Static Standing - Level of Assistance: 4: Min assist  End of Session PT - End of Session Equipment Utilized During Treatment: Gait belt Activity Tolerance: Patient tolerated treatment well Patient left: in bed;with call bell/phone within reach;with bed alarm set;with restraints reapplied (mittens) Nurse Communication: Mobility status  GP     Jolynne Spurgin 12/18/2012, 4:20 PM Zena, PT DPT [phone]

## 2012-12-18 NOTE — Progress Notes (Signed)
  1005:  Pt was attempting to remove hand IV, pt has also removed telemetry equipment mutliple times.  Nurse placed safety mittens on pt.  Nurse will continue to monitor  1115:  Nurse was called into pt room due to bed alarm.  Upon assessment; pt was actively getting out of bed, had removed telemetry equipment and mittens.  Nurse was able to redirect pt with assistance of tech back into bed.  Pt remains agitated and will only follow commands speriodically  Nurse educated pt of safety concern with getting out of bed. Nurse will continue to monitor.  Nurse contacted MD for further orders.  Per MD; pt should not be given benz medications due to possible change in mental status, MD ordered sitter and if unable to obtain sitter have  nurse to place orders for restraints.  Nurse will continue to monitor.

## 2012-12-19 DIAGNOSIS — E039 Hypothyroidism, unspecified: Secondary | ICD-10-CM | POA: Diagnosis not present

## 2012-12-19 DIAGNOSIS — E860 Dehydration: Secondary | ICD-10-CM | POA: Diagnosis not present

## 2012-12-19 DIAGNOSIS — E1110 Type 2 diabetes mellitus with ketoacidosis without coma: Secondary | ICD-10-CM | POA: Diagnosis not present

## 2012-12-19 DIAGNOSIS — R4182 Altered mental status, unspecified: Secondary | ICD-10-CM | POA: Diagnosis not present

## 2012-12-19 LAB — BASIC METABOLIC PANEL
BUN: 3 mg/dL — ABNORMAL LOW (ref 6–23)
BUN: 4 mg/dL — ABNORMAL LOW (ref 6–23)
CO2: 23 mEq/L (ref 19–32)
CO2: 18 mEq/L — ABNORMAL LOW (ref 19–32)
Calcium: 9.2 mg/dL (ref 8.4–10.5)
Chloride: 96 mEq/L (ref 96–112)
GFR calc non Af Amer: 87 mL/min — ABNORMAL LOW (ref >90)
GFR calc non Af Amer: 88 mL/min — ABNORMAL LOW (ref >90)
Glucose, Bld: 98 mg/dL (ref 70–99)
Glucose, Bld: 246 mg/dL — ABNORMAL HIGH (ref 70–99)
Potassium: 2.5 mEq/L — CL (ref 3.5–5.1)
Potassium: 2.9 mEq/L — ABNORMAL LOW (ref 3.5–5.1)

## 2012-12-19 LAB — CBC
Hemoglobin: 12 g/dL (ref 12.0–15.0)
MCH: 32.1 pg (ref 26.0–34.0)
MCHC: 37.3 g/dL — ABNORMAL HIGH (ref 30.0–36.0)
MCV: 86.1 fL (ref 78.0–100.0)

## 2012-12-19 MED ORDER — QUETIAPINE FUMARATE 25 MG PO TABS: 25.0000 mg | ORAL_TABLET | Freq: Every day | ORAL | Status: DC

## 2012-12-19 MED ORDER — HALOPERIDOL LACTATE 5 MG/ML IJ SOLN
1.0000 mg | Freq: Four times a day (QID) | INTRAMUSCULAR | Status: DC | PRN
  Administered 2012-12-19: 1 mg via INTRAVENOUS
  Filled 2012-12-19: qty 1

## 2012-12-19 MED ORDER — QUETIAPINE FUMARATE 25 MG PO TABS
25.0000 mg | ORAL_TABLET | Freq: Every day | ORAL | Status: DC
  Administered 2012-12-20 – 2012-12-21 (×2): 25 mg via ORAL
  Filled 2012-12-19 (×5): qty 1

## 2012-12-19 MED ORDER — LORAZEPAM 2 MG/ML IJ SOLN: 0.2500 mg | Freq: Once | INTRAMUSCULAR | Status: AC

## 2012-12-19 MED ORDER — INSULIN GLARGINE 100 UNIT/ML ~~LOC~~ SOLN: 10.0000 [IU] | Freq: Every day | SUBCUTANEOUS | Status: DC

## 2012-12-19 MED ORDER — POTASSIUM CHLORIDE CRYS ER 10 MEQ PO TBCR
10.0000 meq | EXTENDED_RELEASE_TABLET | Freq: Every day | ORAL | Status: DC
  Administered 2012-12-20 – 2012-12-22 (×2): 10 meq via ORAL
  Filled 2012-12-19 (×4): qty 1

## 2012-12-19 MED ORDER — POTASSIUM CHLORIDE CRYS ER 10 MEQ PO TBCR: 10.0000 meq | EXTENDED_RELEASE_TABLET | Freq: Every day | ORAL | Status: DC

## 2012-12-19 MED ORDER — POTASSIUM CHLORIDE 10 MEQ/100ML IV SOLN
10.0000 meq | INTRAVENOUS | Status: AC
  Administered 2012-12-19 (×4): 10 meq via INTRAVENOUS
  Filled 2012-12-19 (×3): qty 100

## 2012-12-19 MED ORDER — POTASSIUM CHLORIDE 10 MEQ/100ML IV SOLN
INTRAVENOUS | Status: AC
  Filled 2012-12-19: qty 100

## 2012-12-19 MED ORDER — LORAZEPAM 2 MG/ML IJ SOLN
INTRAMUSCULAR | Status: AC
  Administered 2012-12-19: 0.25 mg
  Filled 2012-12-19: qty 1

## 2012-12-19 NOTE — Progress Notes (Signed)
Subjective: Pt with confusion, agitation last night- sundowning- dementia Lab pending BS- ok  Objective: Vital signs in last 24 hours: Temp:  [98.1 F (36.7 C)-98.8 F (37.1 C)] 98.1 F (36.7 C) (07/10 2008) Pulse Rate:  [88-96] 91 (07/10 2008) Resp:  [12-23] 14 (07/11 0600) BP: (96-132)/(67-95) 117/95 mmHg (07/11 0600) SpO2:  [99 %-100 %] 100 % (07/11 0019) Weight change:  Last BM Date: 12/18/12  Intake/Output from previous day: 07/10 0701 - 07/11 0700 In: 1020 [P.O.:120; I.V.:900] Out: 950 [Urine:950] Intake/Output this shift: Total I/O In: -  Out: 100 [Urine:100]  General appearance: alert Resp: clear to auscultation bilaterally Cardio: regular rate and rhythm GI: soft, non-tender; bowel sounds normal; no masses,  no organomegaly  Lab Results:  Recent Labs  12/16/12 1937 12/18/12 0425  WBC 8.9 3.6*  HGB 13.5 10.6*  HCT 39.2 29.5*  PLT 212 115*   BMET  Recent Labs  12/17/12 1129 12/18/12 0425  NA 140 142  K 3.2* 3.0*  CL 105 106  CO2 17* 18*  GLUCOSE 173* 141*  BUN 10 6  CREATININE 0.81 0.78  CALCIUM 8.5 8.5    Studies/Results: No results found.  Medications: I have reviewed the patient's current medications.  Assessment/Plan: DKA/AGMA: improving- HCO3 better- BS ok - repeat labs PO diet- IVF- heplock Delerium/ sundowning- prn Haldol- no evidence of infection- low dose seroquel at bedtime Altered MS- due to combination of Acidosis/ narcotic and dehydration- improving.  DM- SS insulin; with lantus 10 unit at bedtime; hold metformin  Poor appetite- nutrition supportt  Hypothyroid continue synthroid TSH ok  Anemia- repeat CBC  Dementia- moderate- aricept  Depression restart on celexa  Chronic Back Pain/ H/o Compression fx; off Duragesic- lidoderm and tylenol;  Hypokalemia- K replacement  transfer to floor   LOS: 3 days   Charnele Semple 12/19/2012, 7:49 AM

## 2012-12-19 NOTE — Progress Notes (Signed)
~  0445: Round on pt and to relieve NT. NT stated that pt started rambling and yelling (not making complete sense) and started swatting after NT placed safety mitts on. Attempt to talk to pt, pt very irritable and uncooperative. Attempt to place hearing aids, pt shakes head and states "I can put my own hearing aids in". Took off 1 mitt to allow pt to place hearing aide, pt begins pulling at monitor, other mitt, and then swinging at nurse. Able to place safety mitt back on pt. Attempting to use calming techniques unsuccessful.  Pt begins yelling (using profanity) and attempting to get out of bed. This nurse and another nurse able to place pt back into bed. Pt continues kicking, swinging and attempting to bite nurses not allowing nurses to assist/care for pt.  MD paged.  1610: MD notified of pt's condition, uncooperativeness, and combative actions. Order for 0.25mg  Ativan x1. Ativan given. Soothing techniques attempted, unsuccessful.  Will continue to monitor closely.

## 2012-12-19 NOTE — Progress Notes (Signed)
Nurse called report to [place] on [address].  Pt was transported to new room alert and oriented to self and situation, without questions nor concerns prior to transport.  Pt belongings were carried with pt including 2 hearing aides, one " clear" hearing aid was placed in pt left ear, "brown hearing aid and charger" were placed and labeled in pt chart.  Sitter at bedside during transport

## 2012-12-19 NOTE — Progress Notes (Signed)
PT Cancellation Note  Patient Details Name: Sheena [name] MRN: [id number] DOB: 08/14/1940   Cancelled Treatment:    Reason Eval/Treat Not Completed: Medical issues which prohibited therapy. Patient with increased confusion and agitation. Will reattempt at a later time   Fredrich Birks 12/19/2012, 1:38 PM

## 2012-12-20 ENCOUNTER — Encounter (HOSPITAL_COMMUNITY): Payer: Self-pay | Admitting: *Deleted

## 2012-12-20 DIAGNOSIS — E860 Dehydration: Secondary | ICD-10-CM | POA: Diagnosis not present

## 2012-12-20 DIAGNOSIS — R4182 Altered mental status, unspecified: Secondary | ICD-10-CM | POA: Diagnosis not present

## 2012-12-20 DIAGNOSIS — E1110 Type 2 diabetes mellitus with ketoacidosis without coma: Secondary | ICD-10-CM | POA: Diagnosis not present

## 2012-12-20 DIAGNOSIS — F0390 Unspecified dementia without behavioral disturbance: Secondary | ICD-10-CM | POA: Diagnosis not present

## 2012-12-20 LAB — BASIC METABOLIC PANEL
BUN: 4 mg/dL — ABNORMAL LOW (ref 6–23)
CO2: 25 mEq/L (ref 19–32)
Chloride: 99 mEq/L (ref 96–112)
Creatinine, Ser: 0.69 mg/dL (ref 0.50–1.10)
Glucose, Bld: 90 mg/dL (ref 70–99)
Potassium: 2.7 mEq/L — CL (ref 3.5–5.1)

## 2012-12-20 LAB — GLUCOSE, CAPILLARY
Glucose-Capillary: 86 mg/dL (ref 70–99)
Glucose-Capillary: 189 mg/dL — ABNORMAL HIGH (ref 70–99)

## 2012-12-20 LAB — MAGNESIUM: Magnesium: 1 mg/dL — ABNORMAL LOW (ref 1.5–2.5)

## 2012-12-20 MED ORDER — POTASSIUM CHLORIDE 10 MEQ/100ML IV SOLN
10.0000 meq | INTRAVENOUS | Status: AC
  Administered 2012-12-20 (×3): 10 meq via INTRAVENOUS
  Filled 2012-12-20 (×3): qty 100

## 2012-12-20 MED ORDER — POTASSIUM CHLORIDE 10 MEQ/100ML IV SOLN
10.0000 meq | Freq: Once | INTRAVENOUS | Status: DC
  Filled 2012-12-20: qty 100
  Filled 2012-12-20: qty 300
  Filled 2012-12-20: qty 200

## 2012-12-20 MED ORDER — HALOPERIDOL LACTATE 5 MG/ML IJ SOLN
1.0000 mg | Freq: Four times a day (QID) | INTRAMUSCULAR | Status: DC | PRN
  Administered 2012-12-20: 1 mg via INTRAMUSCULAR
  Filled 2012-12-20 (×2): qty 1

## 2012-12-20 MED ORDER — POTASSIUM CHLORIDE 10 MEQ/100ML IV SOLN
INTRAVENOUS | Status: AC
  Administered 2012-12-20: 10 meq
  Filled 2012-12-20: qty 100

## 2012-12-20 NOTE — Progress Notes (Signed)
CRITICAL VALUE ALERT  Critical value received:  K of 2.7  Date of notification:  12/20/2012  Time of notification:  0745  Critical value read back:  Yes  Nurse who received alert:  Roland Rack, RN  MD notified (1st page):  Internal Medicine pager     Time of first page:  [phone]  MD notified (2nd page):  Time of second page:  0845  Responding MD:  Kirby Funk, MD  Time MD responded:  [phone]

## 2012-12-20 NOTE — Progress Notes (Signed)
Subjective: Very confused.  Objective: Vital signs in last 24 hours: Temp:  [97.5 F (36.4 C)-98.8 F (37.1 C)] 98.6 F (37 C) (07/12 1014) Pulse Rate:  [90-121] 92 (07/12 1014) Resp:  [16-24] 20 (07/12 1014) BP: (137-147)/(56-79) 146/76 mmHg (07/12 1014) SpO2:  [98 %-100 %] 98 % (07/12 1014) Weight change:  Last BM Date: 12/18/12  Intake/Output from previous day: 07/11 0701 - 07/12 0700 In: 120 [P.O.:120] Out: 550 [Urine:550] Intake/Output this shift: Total I/O In: 240 [P.O.:240] Out: -   General appearance: syndromic appearance - confused Resp: clear to auscultation bilaterally Cardio: regular rate and rhythm, S1, S2 normal, no murmur, click, rub or gallop GI: soft, non-tender; bowel sounds normal; no masses,  no organomegaly  Lab Results:  Recent Labs  12/18/12 0425 12/19/12 0800  WBC 3.6* 5.2  HGB 10.6* 12.0  HCT 29.5* 32.2*  PLT 115* 154   BMET  Recent Labs  12/19/12 1710 12/20/12 0700  NA 136 139  K 2.9* 2.7*  CL 96 99  CO2 18* 25  GLUCOSE 246* 90  BUN 4* 4*  CREATININE 0.60 0.69  CALCIUM 8.9 9.2    Studies/Results: No results found.  Medications: I have reviewed the patient's current medications.  Assessment/Plan: DKA/AGMA: improving- HCO3 normal BS ok  Altered MS- due to combination of Acidosis/ narcotic and dehydration- confusion over night and this AM Dehydration resolved, stop IVFs  DM- SS insulin; with lantus 10 unit at bedtime; hold metformin  Poor appetite- nutrition consult  Hypothyroid continue synthroid TSH ok  Anemia- hgb normal Dementia- moderate- aricept  Depression restart on celexa  Chronic Back Pain/ H/o Compression fx; off Duragesic- lidoderm and tylenol;  Hypokalemia- K replacement IV, recheck after infusion, goal > 4 PT consult Possible D/C in AM   LOS: 4 days   Sheena [name] 12/20/2012, 11:26 AM

## 2012-12-20 NOTE — Progress Notes (Signed)
Notified NP on call that pt IV d/c'd because it was leaking and not restarted because pt may go home today. Also pt is becoming somewhat agitated and that there was only a IV Haldol order. Order changed in epic to IM by NP.

## 2012-12-21 ENCOUNTER — Encounter (HOSPITAL_COMMUNITY): Payer: Self-pay | Admitting: *Deleted

## 2012-12-21 DIAGNOSIS — E1110 Type 2 diabetes mellitus with ketoacidosis without coma: Secondary | ICD-10-CM | POA: Diagnosis not present

## 2012-12-21 DIAGNOSIS — E860 Dehydration: Secondary | ICD-10-CM | POA: Diagnosis not present

## 2012-12-21 DIAGNOSIS — R4182 Altered mental status, unspecified: Secondary | ICD-10-CM | POA: Diagnosis not present

## 2012-12-21 DIAGNOSIS — F0390 Unspecified dementia without behavioral disturbance: Secondary | ICD-10-CM | POA: Diagnosis not present

## 2012-12-21 LAB — BASIC METABOLIC PANEL
BUN: 9 mg/dL (ref 6–23)
Calcium: 9.6 mg/dL (ref 8.4–10.5)
GFR calc Af Amer: >90 mL/min (ref >90)
GFR calc non Af Amer: 81 mL/min — ABNORMAL LOW (ref >90)
Potassium: 2.6 mEq/L — CL (ref 3.5–5.1)

## 2012-12-21 LAB — POTASSIUM: Potassium: 4.4 mEq/L (ref 3.5–5.1)

## 2012-12-21 MED ORDER — MAGNESIUM SULFATE 40 MG/ML IJ SOLN
2.0000 g | Freq: Once | INTRAMUSCULAR | Status: AC
  Administered 2012-12-21: 2 g via INTRAVENOUS
  Filled 2012-12-21: qty 50

## 2012-12-21 MED ORDER — INSULIN ASPART 100 UNIT/ML ~~LOC~~ SOLN
10.0000 [IU] | Freq: Once | SUBCUTANEOUS | Status: AC
  Administered 2012-12-21: 10 [IU] via SUBCUTANEOUS

## 2012-12-21 MED ORDER — MAGNESIUM CHLORIDE 64 MG PO TBEC
2.0000 | DELAYED_RELEASE_TABLET | Freq: Two times a day (BID) | ORAL | Status: DC
  Administered 2012-12-21 – 2012-12-23 (×4): 128 mg via ORAL
  Filled 2012-12-21 (×6): qty 2

## 2012-12-21 MED ORDER — POTASSIUM CHLORIDE 10 MEQ/100ML IV SOLN
10.0000 meq | INTRAVENOUS | Status: AC
  Administered 2012-12-21 (×6): 10 meq via INTRAVENOUS

## 2012-12-21 NOTE — Progress Notes (Signed)
Subjective: Very agitated and not sleeping last night, has finally fallen asleep.  Also confused per sitter  Objective: Vital signs in last 24 hours: Temp:  [98.3 F (36.8 C)-98.4 F (36.9 C)] 98.3 F (36.8 C) (07/12 2257) Pulse Rate:  [90-96] 90 (07/12 2257) Resp:  [20-21] 21 (07/12 2257) BP: (133-152)/(68-102) 133/102 mmHg (07/12 2257) SpO2:  [100 %] 100 % (07/12 2257) Weight change:  Last BM Date: 12/18/12  Intake/Output from previous day: 07/12 0701 - 07/13 0700 In: 240 [P.O.:240] Out: 200 [Urine:200] Intake/Output this shift:    General appearance: sleeping Resp: clear to auscultation bilaterally Cardio: regular rate and rhythm, S1, S2 normal, no murmur, click, rub or gallop  Lab Results:  Recent Labs  12/19/12 0800  WBC 5.2  HGB 12.0  HCT 32.2*  PLT 154   BMET  Recent Labs  12/20/12 0700 12/20/12 1602 12/21/12 0640  NA 139  --  140  K 2.7* 3.6 2.6*  CL 99  --  100  CO2 25  --  21  GLUCOSE 90  --  156*  BUN 4*  --  9  CREATININE 0.69  --  0.79  CALCIUM 9.2  --  9.6    Studies/Results: No results found.  Medications: I have reviewed the patient's current medications.  Assessment/Plan:  DKA/AGMA: improving- HCO3 normal BS ok  Altered MS- due to combination of Acidosis/ narcotic and dehydration- still very confused and agitated, not sleeping until this AM.  Haldol seems to make her worse, D/C.  Consider increase in seroquel Dehydration resolved, stopped IVFs  DM- SS insulin; with lantus 10 unit at bedtime; hold metformin  Poor appetite- nutrition consult  Hypothyroid continue synthroid TSH ok  Anemia- hgb normal  Dementia- moderate- aricept  Depression restart on celexa  Chronic Back Pain/ H/o Compression fx; off Duragesic- lidoderm and tylenol;  Hypokalemia/Hypomagnesemia, potassium not increasing despite IV runs, likely because Magnesium low, replete both and recheck in am Cardiac monitoring, celexa and seroquel can increase QT, replete  electrolytes.  She pulled off telemetry, will check EKG later today.   LOS: 5 days   Kima Malenfant JOSEPH 12/21/2012, 11:10 AM

## 2012-12-22 DIAGNOSIS — F0391 Unspecified dementia with behavioral disturbance: Secondary | ICD-10-CM | POA: Diagnosis not present

## 2012-12-22 DIAGNOSIS — G9341 Metabolic encephalopathy: Secondary | ICD-10-CM | POA: Diagnosis not present

## 2012-12-22 DIAGNOSIS — E1310 Other specified diabetes mellitus with ketoacidosis without coma: Secondary | ICD-10-CM | POA: Diagnosis not present

## 2012-12-22 DIAGNOSIS — R4182 Altered mental status, unspecified: Secondary | ICD-10-CM | POA: Diagnosis not present

## 2012-12-22 DIAGNOSIS — F29 Unspecified psychosis not due to a substance or known physiological condition: Secondary | ICD-10-CM

## 2012-12-22 DIAGNOSIS — K3184 Gastroparesis: Secondary | ICD-10-CM | POA: Diagnosis not present

## 2012-12-22 DIAGNOSIS — E039 Hypothyroidism, unspecified: Secondary | ICD-10-CM | POA: Diagnosis not present

## 2012-12-22 DIAGNOSIS — E860 Dehydration: Secondary | ICD-10-CM | POA: Diagnosis not present

## 2012-12-22 DIAGNOSIS — E1110 Type 2 diabetes mellitus with ketoacidosis without coma: Secondary | ICD-10-CM | POA: Diagnosis not present

## 2012-12-22 LAB — GLUCOSE, CAPILLARY
Glucose-Capillary: 163 mg/dL — ABNORMAL HIGH (ref 70–99)
Glucose-Capillary: 169 mg/dL — ABNORMAL HIGH (ref 70–99)

## 2012-12-22 LAB — BASIC METABOLIC PANEL
BUN: 20 mg/dL (ref 6–23)
CO2: 21 mEq/L (ref 19–32)
Chloride: 98 mEq/L (ref 96–112)
Creatinine, Ser: 0.86 mg/dL (ref 0.50–1.10)
Glucose, Bld: 175 mg/dL — ABNORMAL HIGH (ref 70–99)

## 2012-12-22 MED ORDER — QUETIAPINE FUMARATE 50 MG PO TABS
50.0000 mg | ORAL_TABLET | Freq: Every day | ORAL | Status: DC
  Administered 2012-12-22: 50 mg via ORAL
  Filled 2012-12-22 (×2): qty 1

## 2012-12-22 MED ORDER — DIVALPROEX SODIUM 125 MG PO CPSP
125.0000 mg | ORAL_CAPSULE | Freq: Two times a day (BID) | ORAL | Status: DC
  Administered 2012-12-22 – 2012-12-23 (×3): 125 mg via ORAL
  Filled 2012-12-22 (×4): qty 1

## 2012-12-22 MED ORDER — DIVALPROEX SODIUM 125 MG PO CPSP: 125.0000 mg | ORAL_CAPSULE | Freq: Two times a day (BID) | ORAL | Status: DC

## 2012-12-22 MED ORDER — QUETIAPINE FUMARATE 50 MG PO TABS: 50.0000 mg | ORAL_TABLET | Freq: Every day | ORAL | Status: DC

## 2012-12-22 MED ORDER — MAGNESIUM CHLORIDE 64 MG PO TBEC: 2.0000 | DELAYED_RELEASE_TABLET | Freq: Every day | ORAL | Status: DC

## 2012-12-22 MED ORDER — METFORMIN HCL 500 MG PO TABS: 500.0000 mg | ORAL_TABLET | Freq: Two times a day (BID) | ORAL | Status: DC

## 2012-12-22 MED ORDER — POTASSIUM CHLORIDE CRYS ER 10 MEQ PO TBCR
10.0000 meq | EXTENDED_RELEASE_TABLET | Freq: Two times a day (BID) | ORAL | Status: DC
  Administered 2012-12-22 – 2012-12-23 (×2): 10 meq via ORAL
  Filled 2012-12-22 (×2): qty 1

## 2012-12-22 MED ORDER — POTASSIUM CHLORIDE CRYS ER 10 MEQ PO TBCR: 10.0000 meq | EXTENDED_RELEASE_TABLET | Freq: Two times a day (BID) | ORAL | Status: DC

## 2012-12-22 MED ORDER — METFORMIN HCL 500 MG PO TABS
500.0000 mg | ORAL_TABLET | Freq: Two times a day (BID) | ORAL | Status: DC
  Administered 2012-12-22 (×2): 500 mg via ORAL
  Filled 2012-12-22 (×5): qty 1

## 2012-12-22 NOTE — Progress Notes (Signed)
Physical Therapy Treatment Patient Details Name: Sheena [name] MRN: [id number] DOB: 06-10-1941 Today's Date: 12/22/2012 Time: 1425-1440 PT Time Calculation (min): 15 min  PT Assessment / Plan / Recommendation  PT Comments   Very distractable and needs multiple cues to follow direction.   Will definitely need 24 hour assist at this point.   Follow Up Recommendations  Home health PT;Supervision/Assistance - 24 hour     Does the patient have the potential to tolerate intense rehabilitation     Barriers to Discharge        Equipment Recommendations  Other (comment) (Still need to assess)    Recommendations for Other Services    Frequency Min 3X/week   Progress towards PT Goals Progress towards PT goals: Progressing toward goals  Plan      Precautions / Restrictions Precautions Precautions: Fall   Pertinent Vitals/Pain     Mobility  Bed Mobility Bed Mobility: Not assessed Transfers Transfers: Sit to Stand;Stand to Sit Sit to Stand: 4: Min guard;From chair/3-in-1 Stand to Sit: To chair/3-in-1;4: Min guard Details for Transfer Assistance: vc's for safety, no assist Ambulation/Gait Ambulation/Gait Assistance: 4: Min guard;4: Min Environmental consultant (Feet): 300 Feet Assistive device: Rolling walker Ambulation/Gait Assistance Details: mildly staggery gait with RW, but worse without RW Gait Pattern: Step-through pattern;Decreased stride length;Narrow base of support Gait velocity: decreased Stairs: Yes Stairs Assistance: 4: Min assist Stairs Assistance Details (indicate cue type and reason): placement of her feet on each step was occ. precarious with a good portion of the shoe not supported on a step.. Stair Management Technique: One rail Right;Forwards Number of Stairs: 6 Wheelchair Mobility Wheelchair Mobility: No    Exercises     PT Diagnosis:    PT Problem List:   PT Treatment Interventions:     PT Goals (current goals can now be found in the care plan  section) Acute Rehab PT Goals Time For Goal Achievement: 12/25/12 Potential to Achieve Goals: Good  Visit Information  Last PT Received On: 12/22/12 Assistance Needed: +1    Subjective Data  Subjective: pt perseverating on a job she had in [employer] Arousal/Alertness: Awake/alert Behavior During Therapy: Impulsive Overall Cognitive Status: No family/caregiver present to determine baseline cognitive functioning Area of Impairment: Following commands;Safety/judgement;Problem solving    Balance  Balance Balance Assessed: Yes Static Standing Balance Static Standing - Balance Support: Bilateral upper extremity supported Static Standing - Level of Assistance: 5: Stand by assistance  End of Session PT - End of Session Activity Tolerance: Patient tolerated treatment well Patient left: with call bell/phone within reach;in chair;Other (comment) (sitter in room) Nurse Communication: Mobility status   GP     Sheena [name], [name] 12/22/2012, 3:07 PM 12/22/2012  Geary Bing, PT [phone]  (pager)

## 2012-12-22 NOTE — Progress Notes (Signed)
Subjective: Pt confused Poor sleep overnight K - ok  Objective: Vital signs in last 24 hours: Temp:  [97.7 F (36.5 C)] 97.7 F (36.5 C) (07/13 2208) Pulse Rate:  [84] 84 (07/13 2208) Resp:  [20] 20 (07/13 2208) BP: (146)/(72) 146/72 mmHg (07/13 2208) Weight change:  Last BM Date: 12/18/12  Intake/Output from previous day:   Intake/Output this shift:    General appearance: alert and distracted Resp: clear to auscultation bilaterally Cardio: regular rate and rhythm GI: soft, non-tender; bowel sounds normal; no masses,  no organomegaly  Lab Results:  Recent Labs  12/19/12 0800  WBC 5.2  HGB 12.0  HCT 32.2*  PLT 154   BMET  Recent Labs  12/20/12 0700  12/21/12 0640 12/21/12 1852  NA 139  --  140  --   K 2.7*  < > 2.6* 4.4  CL 99  --  100  --   CO2 25  --  21  --   GLUCOSE 90  --  156*  --   BUN 4*  --  9  --   CREATININE 0.69  --  0.79  --   CALCIUM 9.2  --  9.6  --   < > = values in this interval not displayed.  Studies/Results: No results found.  Medications: I have reviewed the patient's current medications.  Assessment/Plan: DKA/AGMA: improving- HCO3 normal BS - ok Altered MS- due to combination of Acidosis/ narcotic and dehydration- still very confused and agitated, not sleeping - due to confusion/ psychosis- Haldol -D/C. seroquel - increase to 50 mg qhs Dehydration resolved, stopped IVFs  DM- SS insulin; with lantus 10 unit at bedtime; BS- high- with po intake- better- restart metformin Hypothyroid continue synthroid TSH ok  Anemia- hgb normal  Dementia- moderate- aricept  Depression - with psychosis d/c celexa- start on Depakote 125 mg bid Chronic Back Pain/ H/o Compression fx; off Duragesic- lidoderm and tylenol;  Hypokalemia/Hypomagnesemia,contiune mg/ and K- last K 4.1 Cardiac monitoring, - EKG- QTC - ok HHN/ PT .   LOS: 6 days   Cristal Qadir 12/22/2012, 7:52 AM

## 2012-12-22 NOTE — Care Management Note (Signed)
  Page 1 of 1   12/22/2012     2:24:36 PM   CARE MANAGEMENT NOTE 12/22/2012  Patient:  Sheena [name]   Account Number:  [id number]  Date Initiated:  12/17/2012  Documentation initiated by:  Alvira Philips Assessment:   72 yr-old female adm with dx of DKA; lives with great nephew who assists with ADLs, has cane and walker     Action/Plan:   Anticipated DC Date:  12/23/2012   Anticipated DC Plan:  HOME W HOME HEALTH SERVICES      DC Planning Services  CM consult      Choice offered to / List presented to:  C-4 Adult Children        HH arranged  HH-1 RN  HH-2 PT      HH agency  [facility]   Status of service:  Completed, signed off Medicare Important Message given?   (If response is "NO", the following Medicare IM given date fields will be blank) Date Medicare IM given:   Date Additional Medicare IM given:    Discharge Disposition:    Per UR Regulation:  Reviewed for med. necessity/level of care/duration of stay  If discussed at Long Length of Stay Meetings, dates discussed:    Comments:  Contact:  Shay Jhaveri, son  [phone]   12-22-12 Spoke with Dominic Pea [phone] . Patient lives with her great nephrew who is able to provide 24 hours assistance. Confirmed facesheet information . Dr Arlina Robes would like [facility] ( [facility] has had [facility] ) . His number is contact number [phone]  Ronny Flurry RN BSN

## 2012-12-23 DIAGNOSIS — E039 Hypothyroidism, unspecified: Secondary | ICD-10-CM | POA: Diagnosis not present

## 2012-12-23 DIAGNOSIS — E1110 Type 2 diabetes mellitus with ketoacidosis without coma: Secondary | ICD-10-CM | POA: Diagnosis not present

## 2012-12-23 DIAGNOSIS — R4182 Altered mental status, unspecified: Secondary | ICD-10-CM | POA: Diagnosis not present

## 2012-12-23 DIAGNOSIS — E860 Dehydration: Secondary | ICD-10-CM | POA: Diagnosis not present

## 2012-12-23 LAB — GLUCOSE, CAPILLARY: Glucose-Capillary: 153 mg/dL — ABNORMAL HIGH (ref 70–99)

## 2012-12-23 LAB — BASIC METABOLIC PANEL
BUN: 21 mg/dL (ref 6–23)
CO2: 22 mEq/L (ref 19–32)
Calcium: 9.5 mg/dL (ref 8.4–10.5)
Creatinine, Ser: 0.84 mg/dL (ref 0.50–1.10)
GFR calc non Af Amer: 67 mL/min — ABNORMAL LOW (ref >90)
Glucose, Bld: 251 mg/dL — ABNORMAL HIGH (ref 70–99)
Sodium: 138 mEq/L (ref 135–145)

## 2012-12-23 MED ORDER — QUETIAPINE FUMARATE 50 MG PO TABS: 50.0000 mg | ORAL_TABLET | Freq: Every day | ORAL | Status: DC

## 2012-12-23 MED ORDER — HALOPERIDOL 1 MG PO TABS
1.0000 mg | ORAL_TABLET | Freq: Once | ORAL | Status: AC
  Administered 2012-12-23: 1 mg via ORAL
  Filled 2012-12-23: qty 1

## 2012-12-23 MED ORDER — HALOPERIDOL LACTATE 5 MG/ML IJ SOLN: 1.0000 mg | Freq: Once | INTRAMUSCULAR | Status: AC

## 2012-12-23 MED ORDER — MAGNESIUM CHLORIDE 64 MG PO TBEC: 2.0000 | DELAYED_RELEASE_TABLET | Freq: Every day | ORAL | Status: DC

## 2012-12-23 MED ORDER — HALOPERIDOL 1 MG PO TABS
1.0000 mg | ORAL_TABLET | ORAL | Status: AC
  Administered 2012-12-23: 1 mg via ORAL
  Filled 2012-12-23: qty 1

## 2012-12-23 MED ORDER — POTASSIUM CHLORIDE CRYS ER 20 MEQ PO TBCR
40.0000 meq | EXTENDED_RELEASE_TABLET | Freq: Once | ORAL | Status: AC
  Administered 2012-12-23: 40 meq via ORAL
  Filled 2012-12-23: qty 2

## 2012-12-23 MED ORDER — POTASSIUM CHLORIDE CRYS ER 10 MEQ PO TBCR: 10.0000 meq | EXTENDED_RELEASE_TABLET | Freq: Two times a day (BID) | ORAL | Status: AC

## 2012-12-23 MED ORDER — DIVALPROEX SODIUM 125 MG PO CPSP: 125.0000 mg | ORAL_CAPSULE | Freq: Two times a day (BID) | ORAL | Status: AC

## 2012-12-23 NOTE — Discharge Summary (Signed)
Physician Discharge Summary  Patient ID: Sheena [name] MRN: [id number] DOB/AGE: [birth year] 72 y.o.  Admit date: 12/16/2012 Discharge date: 12/23/2012  Admission Diagnoses:  Discharge Diagnoses:  Active Problems:   High anion gap metabolic acidosis   DKA (diabetic ketoacidoses)   Altered mental status   Severe Diabetic gastroparesis   Dementia delirium   Hypothyroidism   Diabetes mellitus, type 2   Hypokalemia hypomagnesemia   Dehydration   Chronic pain   Psychosis   Discharged Condition: good  Hospital Course: 72 years old female with history of moderate dementia diabetes chronic pain from compression fractures major depression admitted with altered mental status, unable to arouse at home of son, she was brought into emergency room- Problem #1: altered mental status: patient had a CT scan of the head negative for any acute finding, she was found to have metabolic acidosis;DKA; poly-medication- patient was on narcotic patch Encephalopathy picture- with the above conditions. She did not have any signs of infection chest x-ray urinalysis was negative Patient was aggressively hydrated; blood sugars managed. Mental status slowly improved with more awakening.metabolic acidosis corrected-CO2 of 23 Problem #2 acute delirium/psychosis/sundowning: patient was agitated and confused increasingly required p.r.n. Haldol later which was started on Seroquel at night 35 mg and Ticlid 50 mg. She was on Celexa also, EKG was okay with QTc interval. Because of her mood disorder and dementia with depression, she was switched to Depakote, Celexa was discontinued. Patient remained intermittently confused, at nighttime; with poor sleep Problem #3: severe hypokalemia- potassium of 2.6 replacement of IV potassium; she was also found to have hypomagnesemia; replaced IV magnesium, from initial level came up to 1.6 started on oral magnesium supplements as well as oral potassiumsupplements.her potassium was  3.3 Followup outpatient-4 electrolyte checkup; creatinine remained stable, sodium normal Problem #4 diabetes patient was higher dose of insulin at home because of a poor appetite her insulin dose was decreased to 10 units at bedtime, she was started back on her metformin twice a day her sugar remained in the 100s. Problem #5:Maj. Depression continue on Depakote Problem #6: hypothyroid TSH was normal continue on home dose of Synthroid Problem #7: poor nutrition: nutrition patient was had nutritional consult with recommendation of supplements. Problem #8: weakness physical therapy consulted the commended home health PT; for gait training, use a walker Problem #9 history of chronic pain history of arthritis compression fractures of lumbar in the past, patient was on narcotic patch which was discontinued; pain control with Tylenol and Lidoderm pain patch. Problem #10: moderate to advanced dementia continue on Aricept.   Consults: None  Significant Diagnostic Studies: labs: liver treated as potassium 3.3, CO2 23, normal sodium and creatinine. Normal hemoglobin, WBC count normal, UA negative, TSH normal B12 and folate normal, microbiology: urine culture: negative and radiology: CXR: normal and CT scan: negative  Treatments: IV hydration  Discharge Exam: Blood pressure 140/81, pulse 110, temperature 98 F (36.7 C), temperature source Oral, resp. rate 18, height 5\' 3"  (1.6 m), weight 48.7 kg (107 lb 5.8 oz), SpO2 100.00%. General appearance: alert Resp: clear to auscultation bilaterally Cardio: regular rate and rhythm, S1, S2 normal, no murmur, click, rub or gallop GI: soft, non-tender; bowel sounds normal; no masses,  no organomegaly  Disposition: 01-Home or Self Care  Discharge Orders   Future Orders Complete By Expires     Diet - low sodium heart healthy  As directed     Discharge instructions  As directed     Comments:  HHN/PT    Increase activity slowly  As directed          Medication List    STOP taking these medications       citalopram 20 MG tablet  Commonly known as:  CELEXA     fentaNYL 50 MCG/HR  Commonly known as:  DURAGESIC - dosed mcg/hr      TAKE these medications       aspirin EC 81 MG tablet  Take 81 mg by mouth daily.     calcitonin (salmon) 200 UNIT/ACT nasal spray  Commonly known as:  MIACALCIN/FORTICAL  Place 1 spray into the nose daily.     Calcium-Vitamin D 600-200 MG-UNIT Caps  Take 1 tablet by mouth daily.     cholecalciferol 1000 UNITS tablet  Commonly known as:  VITAMIN D  Take 1,000 Units by mouth daily.     divalproex 125 MG capsule  Commonly known as:  DEPAKOTE SPRINKLE  Take 1 capsule (125 mg total) by mouth every 12 (twelve) hours.     donepezil 10 MG tablet  Commonly known as:  ARICEPT  Take 10 mg by mouth at bedtime.     fish oil-omega-3 fatty acids 1000 MG capsule  Take 1 g by mouth daily.     ibuprofen 200 MG tablet  Commonly known as:  ADVIL,MOTRIN  Take 600 mg by mouth every 6 (six) hours as needed. pain     insulin glargine 100 UNIT/ML injection  Commonly known as:  LANTUS  Inject 0.1 mLs (10 Units total) into the skin at bedtime. Injects 10 units in the morning and 20 units at night     levothyroxine 75 MCG tablet  Commonly known as:  SYNTHROID, LEVOTHROID  Take 75 mcg by mouth daily before breakfast.     lidocaine 5 %  Commonly known as:  LIDODERM  Place 1 patch onto the skin daily. Remove & Discard patch within 12 hours or as directed by MD     lovastatin 20 MG tablet  Commonly known as:  MEVACOR  Take 20 mg by mouth at bedtime.     magnesium chloride 64 MG Tbec  Commonly known as:  SLOW-MAG  Take 2 tablets (128 mg total) by mouth daily.     metFORMIN 500 MG tablet  Commonly known as:  GLUCOPHAGE  Take 1 tablet (500 mg total) by mouth 2 (two) times daily with a meal.     mirtazapine 7.5 MG tablet  Commonly known as:  REMERON  Take 7.5 mg by mouth at bedtime.     multivitamin  with minerals Tabs  Take 1 tablet by mouth daily.     potassium chloride 10 MEQ tablet  Commonly known as:  K-DUR,KLOR-CON  Take 1 tablet (10 mEq total) by mouth 2 (two) times daily.     QUEtiapine 50 MG tablet  Commonly known as:  SEROQUEL  Take 1 tablet (50 mg total) by mouth at bedtime.     vitamin B-12 1000 MCG tablet  Commonly known as:  CYANOCOBALAMIN  Take 1,000 mcg by mouth daily.     vitamin C 500 MG tablet  Commonly known as:  ASCORBIC ACID  Take 500 mg by mouth daily.           Follow-up Information   Follow up In 7 days.      Follow up with Georgann Housekeeper, MD.   Contact information:   [address] [place] [address] [phone]  discharge planning time total 45 minutes.  SignedGeorgann Housekeeper 12/23/2012, 8:23 AM

## 2012-12-23 NOTE — Progress Notes (Signed)
Physical Therapy Treatment Patient Details Name: Sheena [name] MRN: [id number] DOB: 1940/11/08 Today's Date: 12/23/2012 Time: 4098-1191 PT Time Calculation (min): 15 min  PT Assessment / Plan / Recommendation  PT Comments   Emphasized gait training for stability and improved speed.   Pt ready for D/C.  Follow Up Recommendations  Home health PT;Supervision/Assistance - 24 hour     Does the patient have the potential to tolerate intense rehabilitation     Barriers to Discharge        Equipment Recommendations  None recommended by PT;Other (comment) (has a RW)    Recommendations for Other Services    Frequency Min 3X/week   Progress towards PT Goals Progress towards PT goals: Progressing toward goals  Plan Current plan remains appropriate    Precautions / Restrictions Precautions Precautions: Fall   Pertinent Vitals/Pain     Mobility  Bed Mobility Bed Mobility: Not assessed Transfers Transfers: Sit to Stand;Stand to Sit Sit to Stand: 4: Min guard;From chair/3-in-1 Stand to Sit: To chair/3-in-1;4: Min guard Details for Transfer Assistance: vc's for safety, no assist Ambulation/Gait Ambulation/Gait Assistance: 4: Min guard;4: Min Environmental consultant (Feet): 850 Feet Assistive device: Rolling walker Ambulation/Gait Assistance Details: min assist needed to help pt keep a steady pace, no physical assist needed  for stability given use of the RW. Gait Pattern: Step-through pattern;Decreased stride length;Narrow base of support Gait velocity: decreased Stairs: No Wheelchair Mobility Wheelchair Mobility: No    Exercises     PT Diagnosis:    PT Problem List:   PT Treatment Interventions:     PT Goals (current goals can now be found in the care plan section) Acute Rehab PT Goals PT Goal Formulation: With patient Time For Goal Achievement: 12/25/12 Potential to Achieve Goals: Good  Visit Information  Last PT Received On: 12/23/12 Assistance Needed: +1 History  of Present Illness: Sheena [name] is a 72 y.o. female who presents to the ED with AMS.  History per son, is that patient began to get more lethargic with diarrhea onset 2 days ago, was waking up and taking all meds but significantly decreased PO intake.  Patient was not given her lantus in the evening of Monday due to low BGL, and wouldn't wake up tues morning to get this.  Because she had progressed to unresponsiveness by the time the son saw her this evening, he brought the patient in to the ED for further evaluation.  Pt found to have ketoacidosis.    Subjective Data  Subjective: I don't go much without my walker because I'm on so much concrete and can't fall.   Cognition  Cognition Arousal/Alertness: Awake/alert Behavior During Therapy: Impulsive Overall Cognitive Status: No family/caregiver present to determine baseline cognitive functioning Area of Impairment: Safety/judgement;Problem solving Following Commands: Follows one step commands with increased time Problem Solving: Slow processing    Balance  Static Standing Balance Static Standing - Balance Support: Bilateral upper extremity supported Static Standing - Level of Assistance: 5: Stand by assistance Static Standing - Comment/# of Minutes: usign the RW to rest  End of Session PT - End of Session Activity Tolerance: Patient tolerated treatment well Patient left: with call bell/phone within reach;in chair;Other (comment) (sitter in room) Nurse Communication: Mobility status   GP     Sheena [name], [name] 12/23/2012, 4:34 PM  12/23/2012  [place] Bing, PT [phone]  (pager)

## 2012-12-23 NOTE — Progress Notes (Signed)
Md on call was notified that pt was agitated and not able to calm down, new orders received will continue to monitor. Ilean Skill LPN

## 2012-12-25 DIAGNOSIS — E46 Unspecified protein-calorie malnutrition: Secondary | ICD-10-CM | POA: Diagnosis not present

## 2012-12-25 DIAGNOSIS — F0390 Unspecified dementia without behavioral disturbance: Secondary | ICD-10-CM | POA: Diagnosis not present

## 2012-12-25 DIAGNOSIS — G8929 Other chronic pain: Secondary | ICD-10-CM | POA: Diagnosis not present

## 2012-12-25 DIAGNOSIS — F329 Major depressive disorder, single episode, unspecified: Secondary | ICD-10-CM | POA: Diagnosis not present

## 2012-12-25 DIAGNOSIS — I10 Essential (primary) hypertension: Secondary | ICD-10-CM | POA: Diagnosis not present

## 2012-12-25 DIAGNOSIS — Z8781 Personal history of (healed) traumatic fracture: Secondary | ICD-10-CM | POA: Diagnosis not present

## 2012-12-29 DIAGNOSIS — E46 Unspecified protein-calorie malnutrition: Secondary | ICD-10-CM | POA: Diagnosis not present

## 2012-12-29 DIAGNOSIS — I10 Essential (primary) hypertension: Secondary | ICD-10-CM | POA: Diagnosis not present

## 2012-12-29 DIAGNOSIS — F329 Major depressive disorder, single episode, unspecified: Secondary | ICD-10-CM | POA: Diagnosis not present

## 2012-12-29 DIAGNOSIS — F0390 Unspecified dementia without behavioral disturbance: Secondary | ICD-10-CM | POA: Diagnosis not present

## 2012-12-29 DIAGNOSIS — G8929 Other chronic pain: Secondary | ICD-10-CM | POA: Diagnosis not present

## 2012-12-30 DIAGNOSIS — F329 Major depressive disorder, single episode, unspecified: Secondary | ICD-10-CM | POA: Diagnosis not present

## 2012-12-30 DIAGNOSIS — E46 Unspecified protein-calorie malnutrition: Secondary | ICD-10-CM | POA: Diagnosis not present

## 2012-12-30 DIAGNOSIS — F0390 Unspecified dementia without behavioral disturbance: Secondary | ICD-10-CM | POA: Diagnosis not present

## 2012-12-30 DIAGNOSIS — G8929 Other chronic pain: Secondary | ICD-10-CM | POA: Diagnosis not present

## 2012-12-30 DIAGNOSIS — I10 Essential (primary) hypertension: Secondary | ICD-10-CM | POA: Diagnosis not present

## 2012-12-31 DIAGNOSIS — E119 Type 2 diabetes mellitus without complications: Secondary | ICD-10-CM | POA: Diagnosis not present

## 2012-12-31 DIAGNOSIS — F329 Major depressive disorder, single episode, unspecified: Secondary | ICD-10-CM | POA: Diagnosis not present

## 2012-12-31 DIAGNOSIS — F39 Unspecified mood [affective] disorder: Secondary | ICD-10-CM | POA: Diagnosis not present

## 2012-12-31 DIAGNOSIS — E876 Hypokalemia: Secondary | ICD-10-CM | POA: Diagnosis not present

## 2012-12-31 DIAGNOSIS — F0390 Unspecified dementia without behavioral disturbance: Secondary | ICD-10-CM | POA: Diagnosis not present

## 2013-01-01 DIAGNOSIS — F329 Major depressive disorder, single episode, unspecified: Secondary | ICD-10-CM | POA: Diagnosis not present

## 2013-01-01 DIAGNOSIS — G8929 Other chronic pain: Secondary | ICD-10-CM | POA: Diagnosis not present

## 2013-01-01 DIAGNOSIS — F0390 Unspecified dementia without behavioral disturbance: Secondary | ICD-10-CM | POA: Diagnosis not present

## 2013-01-01 DIAGNOSIS — I10 Essential (primary) hypertension: Secondary | ICD-10-CM | POA: Diagnosis not present

## 2013-01-01 DIAGNOSIS — E46 Unspecified protein-calorie malnutrition: Secondary | ICD-10-CM | POA: Diagnosis not present

## 2013-01-04 DIAGNOSIS — E119 Type 2 diabetes mellitus without complications: Secondary | ICD-10-CM | POA: Diagnosis not present

## 2013-01-07 DIAGNOSIS — E46 Unspecified protein-calorie malnutrition: Secondary | ICD-10-CM | POA: Diagnosis not present

## 2013-01-07 DIAGNOSIS — F329 Major depressive disorder, single episode, unspecified: Secondary | ICD-10-CM | POA: Diagnosis not present

## 2013-01-07 DIAGNOSIS — I10 Essential (primary) hypertension: Secondary | ICD-10-CM | POA: Diagnosis not present

## 2013-01-07 DIAGNOSIS — G8929 Other chronic pain: Secondary | ICD-10-CM | POA: Diagnosis not present

## 2013-01-07 DIAGNOSIS — F0390 Unspecified dementia without behavioral disturbance: Secondary | ICD-10-CM | POA: Diagnosis not present

## 2013-01-08 DIAGNOSIS — F329 Major depressive disorder, single episode, unspecified: Secondary | ICD-10-CM | POA: Diagnosis not present

## 2013-01-08 DIAGNOSIS — E46 Unspecified protein-calorie malnutrition: Secondary | ICD-10-CM | POA: Diagnosis not present

## 2013-01-08 DIAGNOSIS — F0390 Unspecified dementia without behavioral disturbance: Secondary | ICD-10-CM | POA: Diagnosis not present

## 2013-01-08 DIAGNOSIS — I10 Essential (primary) hypertension: Secondary | ICD-10-CM | POA: Diagnosis not present

## 2013-01-08 DIAGNOSIS — G8929 Other chronic pain: Secondary | ICD-10-CM | POA: Diagnosis not present

## 2013-01-09 DIAGNOSIS — F329 Major depressive disorder, single episode, unspecified: Secondary | ICD-10-CM | POA: Diagnosis not present

## 2013-01-09 DIAGNOSIS — G8929 Other chronic pain: Secondary | ICD-10-CM | POA: Diagnosis not present

## 2013-01-09 DIAGNOSIS — E46 Unspecified protein-calorie malnutrition: Secondary | ICD-10-CM | POA: Diagnosis not present

## 2013-01-09 DIAGNOSIS — F0390 Unspecified dementia without behavioral disturbance: Secondary | ICD-10-CM | POA: Diagnosis not present

## 2013-01-09 DIAGNOSIS — I10 Essential (primary) hypertension: Secondary | ICD-10-CM | POA: Diagnosis not present

## 2013-01-12 DIAGNOSIS — I10 Essential (primary) hypertension: Secondary | ICD-10-CM | POA: Diagnosis not present

## 2013-01-12 DIAGNOSIS — F329 Major depressive disorder, single episode, unspecified: Secondary | ICD-10-CM | POA: Diagnosis not present

## 2013-01-12 DIAGNOSIS — G8929 Other chronic pain: Secondary | ICD-10-CM | POA: Diagnosis not present

## 2013-01-12 DIAGNOSIS — F0390 Unspecified dementia without behavioral disturbance: Secondary | ICD-10-CM | POA: Diagnosis not present

## 2013-01-12 DIAGNOSIS — E46 Unspecified protein-calorie malnutrition: Secondary | ICD-10-CM | POA: Diagnosis not present

## 2013-01-14 DIAGNOSIS — F0390 Unspecified dementia without behavioral disturbance: Secondary | ICD-10-CM | POA: Diagnosis not present

## 2013-01-14 DIAGNOSIS — E46 Unspecified protein-calorie malnutrition: Secondary | ICD-10-CM | POA: Diagnosis not present

## 2013-01-14 DIAGNOSIS — I10 Essential (primary) hypertension: Secondary | ICD-10-CM | POA: Diagnosis not present

## 2013-01-14 DIAGNOSIS — F329 Major depressive disorder, single episode, unspecified: Secondary | ICD-10-CM | POA: Diagnosis not present

## 2013-01-14 DIAGNOSIS — G8929 Other chronic pain: Secondary | ICD-10-CM | POA: Diagnosis not present

## 2013-01-15 DIAGNOSIS — G8929 Other chronic pain: Secondary | ICD-10-CM | POA: Diagnosis not present

## 2013-01-15 DIAGNOSIS — I10 Essential (primary) hypertension: Secondary | ICD-10-CM | POA: Diagnosis not present

## 2013-01-15 DIAGNOSIS — F329 Major depressive disorder, single episode, unspecified: Secondary | ICD-10-CM | POA: Diagnosis not present

## 2013-01-15 DIAGNOSIS — E46 Unspecified protein-calorie malnutrition: Secondary | ICD-10-CM | POA: Diagnosis not present

## 2013-01-15 DIAGNOSIS — F0390 Unspecified dementia without behavioral disturbance: Secondary | ICD-10-CM | POA: Diagnosis not present

## 2013-01-21 DIAGNOSIS — F39 Unspecified mood [affective] disorder: Secondary | ICD-10-CM | POA: Diagnosis not present

## 2013-01-21 DIAGNOSIS — G8929 Other chronic pain: Secondary | ICD-10-CM | POA: Diagnosis not present

## 2013-01-21 DIAGNOSIS — I10 Essential (primary) hypertension: Secondary | ICD-10-CM | POA: Diagnosis not present

## 2013-01-21 DIAGNOSIS — E46 Unspecified protein-calorie malnutrition: Secondary | ICD-10-CM | POA: Diagnosis not present

## 2013-01-21 DIAGNOSIS — F0390 Unspecified dementia without behavioral disturbance: Secondary | ICD-10-CM | POA: Diagnosis not present

## 2013-01-21 DIAGNOSIS — F329 Major depressive disorder, single episode, unspecified: Secondary | ICD-10-CM | POA: Diagnosis not present

## 2013-01-21 DIAGNOSIS — E876 Hypokalemia: Secondary | ICD-10-CM | POA: Diagnosis not present

## 2013-01-21 DIAGNOSIS — E119 Type 2 diabetes mellitus without complications: Secondary | ICD-10-CM | POA: Diagnosis not present

## 2013-01-30 DIAGNOSIS — G8929 Other chronic pain: Secondary | ICD-10-CM | POA: Diagnosis not present

## 2013-01-30 DIAGNOSIS — F329 Major depressive disorder, single episode, unspecified: Secondary | ICD-10-CM | POA: Diagnosis not present

## 2013-01-30 DIAGNOSIS — I10 Essential (primary) hypertension: Secondary | ICD-10-CM | POA: Diagnosis not present

## 2013-01-30 DIAGNOSIS — F0390 Unspecified dementia without behavioral disturbance: Secondary | ICD-10-CM | POA: Diagnosis not present

## 2013-01-30 DIAGNOSIS — E46 Unspecified protein-calorie malnutrition: Secondary | ICD-10-CM | POA: Diagnosis not present

## 2013-03-05 DIAGNOSIS — E039 Hypothyroidism, unspecified: Secondary | ICD-10-CM | POA: Diagnosis not present

## 2013-03-05 DIAGNOSIS — K219 Gastro-esophageal reflux disease without esophagitis: Secondary | ICD-10-CM | POA: Diagnosis not present

## 2013-03-05 DIAGNOSIS — E119 Type 2 diabetes mellitus without complications: Secondary | ICD-10-CM | POA: Diagnosis not present

## 2013-03-05 DIAGNOSIS — I10 Essential (primary) hypertension: Secondary | ICD-10-CM | POA: Diagnosis not present

## 2014-03-23 DIAGNOSIS — K219 Gastro-esophageal reflux disease without esophagitis: Secondary | ICD-10-CM | POA: Diagnosis not present

## 2014-03-23 DIAGNOSIS — I10 Essential (primary) hypertension: Secondary | ICD-10-CM | POA: Diagnosis not present

## 2014-03-23 DIAGNOSIS — E039 Hypothyroidism, unspecified: Secondary | ICD-10-CM | POA: Diagnosis not present

## 2014-03-23 DIAGNOSIS — F0390 Unspecified dementia without behavioral disturbance: Secondary | ICD-10-CM | POA: Diagnosis not present

## 2014-03-23 DIAGNOSIS — F324 Major depressive disorder, single episode, in partial remission: Secondary | ICD-10-CM | POA: Diagnosis not present

## 2014-03-23 DIAGNOSIS — E780 Pure hypercholesterolemia: Secondary | ICD-10-CM | POA: Diagnosis not present

## 2014-03-23 DIAGNOSIS — E1141 Type 2 diabetes mellitus with diabetic mononeuropathy: Secondary | ICD-10-CM | POA: Diagnosis not present

## 2014-03-23 DIAGNOSIS — E441 Mild protein-calorie malnutrition: Secondary | ICD-10-CM | POA: Diagnosis not present

## 2014-03-23 DIAGNOSIS — Z23 Encounter for immunization: Secondary | ICD-10-CM | POA: Diagnosis not present

## 2014-04-20 DIAGNOSIS — E039 Hypothyroidism, unspecified: Secondary | ICD-10-CM | POA: Diagnosis not present

## 2014-09-28 DIAGNOSIS — E1141 Type 2 diabetes mellitus with diabetic mononeuropathy: Secondary | ICD-10-CM | POA: Diagnosis not present

## 2014-09-28 DIAGNOSIS — E039 Hypothyroidism, unspecified: Secondary | ICD-10-CM | POA: Diagnosis not present

## 2014-09-28 DIAGNOSIS — F0390 Unspecified dementia without behavioral disturbance: Secondary | ICD-10-CM | POA: Diagnosis not present

## 2014-09-28 DIAGNOSIS — E441 Mild protein-calorie malnutrition: Secondary | ICD-10-CM | POA: Diagnosis not present

## 2014-09-28 DIAGNOSIS — Z1389 Encounter for screening for other disorder: Secondary | ICD-10-CM | POA: Diagnosis not present

## 2014-09-28 DIAGNOSIS — E780 Pure hypercholesterolemia: Secondary | ICD-10-CM | POA: Diagnosis not present

## 2014-09-28 DIAGNOSIS — F324 Major depressive disorder, single episode, in partial remission: Secondary | ICD-10-CM | POA: Diagnosis not present

## 2014-09-28 DIAGNOSIS — M810 Age-related osteoporosis without current pathological fracture: Secondary | ICD-10-CM | POA: Diagnosis not present

## 2014-09-28 DIAGNOSIS — I10 Essential (primary) hypertension: Secondary | ICD-10-CM | POA: Diagnosis not present

## 2014-09-29 ENCOUNTER — Other Ambulatory Visit: Payer: Self-pay

## 2014-09-29 DIAGNOSIS — Z1231 Encounter for screening mammogram for malignant neoplasm of breast: Secondary | ICD-10-CM

## 2014-10-06 ENCOUNTER — Ambulatory Visit
Admission: RE | Admit: 2014-10-06 | Discharge: 2014-10-06 | Disposition: A | Discharge status: Home / Self Care | Payer: Medicare Other | Source: Ambulatory Visit

## 2014-10-06 DIAGNOSIS — Z1231 Encounter for screening mammogram for malignant neoplasm of breast: Secondary | ICD-10-CM | POA: Diagnosis not present

## 2014-10-27 DIAGNOSIS — M810 Age-related osteoporosis without current pathological fracture: Secondary | ICD-10-CM | POA: Diagnosis not present

## 2014-11-11 DIAGNOSIS — E039 Hypothyroidism, unspecified: Secondary | ICD-10-CM | POA: Diagnosis not present

## 2014-12-20 DIAGNOSIS — K219 Gastro-esophageal reflux disease without esophagitis: Secondary | ICD-10-CM | POA: Diagnosis not present

## 2014-12-20 DIAGNOSIS — E441 Mild protein-calorie malnutrition: Secondary | ICD-10-CM | POA: Diagnosis not present

## 2014-12-20 DIAGNOSIS — F0390 Unspecified dementia without behavioral disturbance: Secondary | ICD-10-CM | POA: Diagnosis not present

## 2014-12-20 DIAGNOSIS — Z794 Long term (current) use of insulin: Secondary | ICD-10-CM | POA: Diagnosis not present

## 2014-12-20 DIAGNOSIS — E1141 Type 2 diabetes mellitus with diabetic mononeuropathy: Secondary | ICD-10-CM | POA: Diagnosis not present

## 2014-12-20 DIAGNOSIS — I10 Essential (primary) hypertension: Secondary | ICD-10-CM | POA: Diagnosis not present

## 2014-12-20 DIAGNOSIS — F324 Major depressive disorder, single episode, in partial remission: Secondary | ICD-10-CM | POA: Diagnosis not present

## 2014-12-20 DIAGNOSIS — E780 Pure hypercholesterolemia: Secondary | ICD-10-CM | POA: Diagnosis not present

## 2015-03-29 DIAGNOSIS — Z23 Encounter for immunization: Secondary | ICD-10-CM | POA: Diagnosis not present

## 2015-03-29 DIAGNOSIS — E441 Mild protein-calorie malnutrition: Secondary | ICD-10-CM | POA: Diagnosis not present

## 2015-03-29 DIAGNOSIS — Z794 Long term (current) use of insulin: Secondary | ICD-10-CM | POA: Diagnosis not present

## 2015-03-29 DIAGNOSIS — E039 Hypothyroidism, unspecified: Secondary | ICD-10-CM | POA: Diagnosis not present

## 2015-03-29 DIAGNOSIS — I10 Essential (primary) hypertension: Secondary | ICD-10-CM | POA: Diagnosis not present

## 2015-03-29 DIAGNOSIS — Z1211 Encounter for screening for malignant neoplasm of colon: Secondary | ICD-10-CM | POA: Diagnosis not present

## 2015-03-29 DIAGNOSIS — H9193 Unspecified hearing loss, bilateral: Secondary | ICD-10-CM | POA: Diagnosis not present

## 2015-03-29 DIAGNOSIS — E1141 Type 2 diabetes mellitus with diabetic mononeuropathy: Secondary | ICD-10-CM | POA: Diagnosis not present

## 2015-03-29 DIAGNOSIS — K219 Gastro-esophageal reflux disease without esophagitis: Secondary | ICD-10-CM | POA: Diagnosis not present

## 2015-03-29 DIAGNOSIS — F0390 Unspecified dementia without behavioral disturbance: Secondary | ICD-10-CM | POA: Diagnosis not present

## 2015-03-29 DIAGNOSIS — F324 Major depressive disorder, single episode, in partial remission: Secondary | ICD-10-CM | POA: Diagnosis not present

## 2015-03-29 DIAGNOSIS — E7800 Pure hypercholesterolemia, unspecified: Secondary | ICD-10-CM | POA: Diagnosis not present

## 2015-07-12 DIAGNOSIS — Z1211 Encounter for screening for malignant neoplasm of colon: Secondary | ICD-10-CM | POA: Diagnosis not present

## 2015-10-04 DIAGNOSIS — E119 Type 2 diabetes mellitus without complications: Secondary | ICD-10-CM | POA: Diagnosis not present

## 2015-12-21 DIAGNOSIS — E039 Hypothyroidism, unspecified: Secondary | ICD-10-CM | POA: Diagnosis not present

## 2015-12-21 DIAGNOSIS — H9193 Unspecified hearing loss, bilateral: Secondary | ICD-10-CM | POA: Diagnosis not present

## 2015-12-21 DIAGNOSIS — F0390 Unspecified dementia without behavioral disturbance: Secondary | ICD-10-CM | POA: Diagnosis not present

## 2015-12-21 DIAGNOSIS — E7800 Pure hypercholesterolemia, unspecified: Secondary | ICD-10-CM | POA: Diagnosis not present

## 2015-12-21 DIAGNOSIS — Z1389 Encounter for screening for other disorder: Secondary | ICD-10-CM | POA: Diagnosis not present

## 2015-12-21 DIAGNOSIS — Z7984 Long term (current) use of oral hypoglycemic drugs: Secondary | ICD-10-CM | POA: Diagnosis not present

## 2015-12-21 DIAGNOSIS — E1141 Type 2 diabetes mellitus with diabetic mononeuropathy: Secondary | ICD-10-CM | POA: Diagnosis not present

## 2015-12-21 DIAGNOSIS — I10 Essential (primary) hypertension: Secondary | ICD-10-CM | POA: Diagnosis not present

## 2015-12-21 DIAGNOSIS — F324 Major depressive disorder, single episode, in partial remission: Secondary | ICD-10-CM | POA: Diagnosis not present

## 2015-12-21 DIAGNOSIS — M4806 Spinal stenosis, lumbar region: Secondary | ICD-10-CM | POA: Diagnosis not present

## 2016-03-27 DIAGNOSIS — H903 Sensorineural hearing loss, bilateral: Secondary | ICD-10-CM | POA: Diagnosis not present

## 2016-04-13 DIAGNOSIS — H903 Sensorineural hearing loss, bilateral: Secondary | ICD-10-CM | POA: Diagnosis not present

## 2016-05-07 DIAGNOSIS — H903 Sensorineural hearing loss, bilateral: Secondary | ICD-10-CM | POA: Diagnosis not present

## 2016-09-25 ENCOUNTER — Inpatient Hospital Stay (HOSPITAL_COMMUNITY): Payer: Medicare Other

## 2016-09-25 ENCOUNTER — Emergency Department (HOSPITAL_COMMUNITY): Payer: Medicare Other

## 2016-09-25 ENCOUNTER — Encounter (HOSPITAL_COMMUNITY): Payer: Self-pay | Admitting: Obstetrics and Gynecology

## 2016-09-25 ENCOUNTER — Inpatient Hospital Stay (HOSPITAL_COMMUNITY)
Admission: EM | Admit: 2016-09-25 | Discharge: 2016-09-28 | DRG: 637 | Disposition: A | Discharge status: Skilled Nursing Facility | Payer: Medicare Other | Attending: Family Medicine | Admitting: Family Medicine

## 2016-09-25 DIAGNOSIS — R488 Other symbolic dysfunctions: Secondary | ICD-10-CM | POA: Diagnosis not present

## 2016-09-25 DIAGNOSIS — E118 Type 2 diabetes mellitus with unspecified complications: Secondary | ICD-10-CM

## 2016-09-25 DIAGNOSIS — F329 Major depressive disorder, single episode, unspecified: Secondary | ICD-10-CM | POA: Diagnosis present

## 2016-09-25 DIAGNOSIS — E162 Hypoglycemia, unspecified: Secondary | ICD-10-CM | POA: Diagnosis not present

## 2016-09-25 DIAGNOSIS — E785 Hyperlipidemia, unspecified: Secondary | ICD-10-CM | POA: Diagnosis present

## 2016-09-25 DIAGNOSIS — E039 Hypothyroidism, unspecified: Secondary | ICD-10-CM | POA: Diagnosis not present

## 2016-09-25 DIAGNOSIS — S62101A Fracture of unspecified carpal bone, right wrist, initial encounter for closed fracture: Secondary | ICD-10-CM

## 2016-09-25 DIAGNOSIS — E876 Hypokalemia: Secondary | ICD-10-CM

## 2016-09-25 DIAGNOSIS — D649 Anemia, unspecified: Secondary | ICD-10-CM

## 2016-09-25 DIAGNOSIS — S3993XA Unspecified injury of pelvis, initial encounter: Secondary | ICD-10-CM | POA: Diagnosis not present

## 2016-09-25 DIAGNOSIS — M25511 Pain in right shoulder: Secondary | ICD-10-CM | POA: Diagnosis not present

## 2016-09-25 DIAGNOSIS — S62101D Fracture of unspecified carpal bone, right wrist, subsequent encounter for fracture with routine healing: Secondary | ICD-10-CM | POA: Diagnosis not present

## 2016-09-25 DIAGNOSIS — E11649 Type 2 diabetes mellitus with hypoglycemia without coma: Secondary | ICD-10-CM | POA: Diagnosis not present

## 2016-09-25 DIAGNOSIS — Z881 Allergy status to other antibiotic agents status: Secondary | ICD-10-CM

## 2016-09-25 DIAGNOSIS — S52614A Nondisplaced fracture of right ulna styloid process, initial encounter for closed fracture: Secondary | ICD-10-CM | POA: Diagnosis present

## 2016-09-25 DIAGNOSIS — M533 Sacrococcygeal disorders, not elsewhere classified: Secondary | ICD-10-CM | POA: Diagnosis not present

## 2016-09-25 DIAGNOSIS — M549 Dorsalgia, unspecified: Secondary | ICD-10-CM | POA: Diagnosis not present

## 2016-09-25 DIAGNOSIS — Z794 Long term (current) use of insulin: Secondary | ICD-10-CM | POA: Diagnosis not present

## 2016-09-25 DIAGNOSIS — S3991XA Unspecified injury of abdomen, initial encounter: Secondary | ICD-10-CM | POA: Diagnosis not present

## 2016-09-25 DIAGNOSIS — R109 Unspecified abdominal pain: Secondary | ICD-10-CM | POA: Diagnosis present

## 2016-09-25 DIAGNOSIS — S52501A Unspecified fracture of the lower end of right radius, initial encounter for closed fracture: Secondary | ICD-10-CM | POA: Diagnosis not present

## 2016-09-25 DIAGNOSIS — M545 Low back pain: Secondary | ICD-10-CM | POA: Diagnosis not present

## 2016-09-25 DIAGNOSIS — R4182 Altered mental status, unspecified: Secondary | ICD-10-CM | POA: Diagnosis not present

## 2016-09-25 DIAGNOSIS — R2681 Unsteadiness on feet: Secondary | ICD-10-CM | POA: Diagnosis not present

## 2016-09-25 DIAGNOSIS — G9340 Encephalopathy, unspecified: Secondary | ICD-10-CM

## 2016-09-25 DIAGNOSIS — H9190 Unspecified hearing loss, unspecified ear: Secondary | ICD-10-CM | POA: Diagnosis present

## 2016-09-25 DIAGNOSIS — Z66 Do not resuscitate: Secondary | ICD-10-CM | POA: Diagnosis present

## 2016-09-25 DIAGNOSIS — R1084 Generalized abdominal pain: Secondary | ICD-10-CM | POA: Diagnosis not present

## 2016-09-25 DIAGNOSIS — N179 Acute kidney failure, unspecified: Secondary | ICD-10-CM | POA: Diagnosis not present

## 2016-09-25 DIAGNOSIS — M6281 Muscle weakness (generalized): Secondary | ICD-10-CM | POA: Diagnosis not present

## 2016-09-25 DIAGNOSIS — Z7982 Long term (current) use of aspirin: Secondary | ICD-10-CM

## 2016-09-25 DIAGNOSIS — S52391A Other fracture of shaft of radius, right arm, initial encounter for closed fracture: Secondary | ICD-10-CM | POA: Diagnosis not present

## 2016-09-25 DIAGNOSIS — R278 Other lack of coordination: Secondary | ICD-10-CM | POA: Diagnosis not present

## 2016-09-25 DIAGNOSIS — S52511A Displaced fracture of right radial styloid process, initial encounter for closed fracture: Secondary | ICD-10-CM | POA: Diagnosis not present

## 2016-09-25 DIAGNOSIS — R630 Anorexia: Secondary | ICD-10-CM | POA: Diagnosis present

## 2016-09-25 DIAGNOSIS — S6990XA Unspecified injury of unspecified wrist, hand and finger(s), initial encounter: Secondary | ICD-10-CM | POA: Diagnosis not present

## 2016-09-25 DIAGNOSIS — Z4789 Encounter for other orthopedic aftercare: Secondary | ICD-10-CM | POA: Diagnosis not present

## 2016-09-25 DIAGNOSIS — W19XXXA Unspecified fall, initial encounter: Secondary | ICD-10-CM | POA: Diagnosis not present

## 2016-09-25 DIAGNOSIS — I10 Essential (primary) hypertension: Secondary | ICD-10-CM | POA: Diagnosis present

## 2016-09-25 DIAGNOSIS — R1312 Dysphagia, oropharyngeal phase: Secondary | ICD-10-CM | POA: Diagnosis not present

## 2016-09-25 DIAGNOSIS — S40011A Contusion of right shoulder, initial encounter: Secondary | ICD-10-CM | POA: Diagnosis present

## 2016-09-25 DIAGNOSIS — S3992XA Unspecified injury of lower back, initial encounter: Secondary | ICD-10-CM | POA: Diagnosis not present

## 2016-09-25 DIAGNOSIS — S52591A Other fractures of lower end of right radius, initial encounter for closed fracture: Secondary | ICD-10-CM | POA: Diagnosis present

## 2016-09-25 DIAGNOSIS — S199XXA Unspecified injury of neck, initial encounter: Secondary | ICD-10-CM | POA: Diagnosis not present

## 2016-09-25 DIAGNOSIS — S62101S Fracture of unspecified carpal bone, right wrist, sequela: Secondary | ICD-10-CM | POA: Diagnosis not present

## 2016-09-25 DIAGNOSIS — E119 Type 2 diabetes mellitus without complications: Secondary | ICD-10-CM

## 2016-09-25 DIAGNOSIS — F0390 Unspecified dementia without behavioral disturbance: Secondary | ICD-10-CM | POA: Diagnosis not present

## 2016-09-25 DIAGNOSIS — S0990XA Unspecified injury of head, initial encounter: Secondary | ICD-10-CM | POA: Diagnosis not present

## 2016-09-25 LAB — PROTIME-INR
INR: 1.13
PROTHROMBIN TIME: 14.6 s (ref 11.4–15.2)

## 2016-09-25 LAB — BASIC METABOLIC PANEL
ANION GAP: 12 (ref 5–15)
BUN: 23 mg/dL — AB (ref 6–20)
CHLORIDE: 103 mmol/L (ref 101–111)
CO2: 25 mmol/L (ref 22–32)
Calcium: 9.3 mg/dL (ref 8.9–10.3)
Creatinine, Ser: 0.93 mg/dL (ref 0.44–1.00)
GFR calc Af Amer: >60 mL/min (ref >60)
GFR calc non Af Amer: 58 mL/min — ABNORMAL LOW (ref >60)
Glucose, Bld: 102 mg/dL — ABNORMAL HIGH (ref 65–99)
POTASSIUM: 3.7 mmol/L (ref 3.5–5.1)
SODIUM: 140 mmol/L (ref 135–145)

## 2016-09-25 LAB — CBC WITH DIFFERENTIAL/PLATELET
BASOS ABS: 0 10*3/uL (ref 0.0–0.1)
BASOS PCT: 0 %
Eosinophils Absolute: 0 10*3/uL (ref 0.0–0.7)
Eosinophils Relative: 0 %
HEMATOCRIT: 27.6 % — AB (ref 36.0–46.0)
Hemoglobin: 8.7 g/dL — ABNORMAL LOW (ref 12.0–15.0)
Lymphocytes Relative: 14 %
Lymphs Abs: 1.1 10*3/uL (ref 0.7–4.0)
MCH: 25.8 pg — ABNORMAL LOW (ref 26.0–34.0)
MCHC: 31.5 g/dL (ref 30.0–36.0)
MCV: 81.9 fL (ref 78.0–100.0)
Monocytes Absolute: 0.8 10*3/uL (ref 0.1–1.0)
Monocytes Relative: 11 %
NEUTROS ABS: 5.6 10*3/uL (ref 1.7–7.7)
NEUTROS PCT: 75 %
Platelets: 203 10*3/uL (ref 150–400)
RBC: 3.37 MIL/uL — AB (ref 3.87–5.11)
RDW: 16.2 % — ABNORMAL HIGH (ref 11.5–15.5)
WBC: 7.5 10*3/uL (ref 4.0–10.5)

## 2016-09-25 LAB — RETICULOCYTES
RBC.: 3.37 MIL/uL — AB (ref 3.87–5.11)
RETIC COUNT ABSOLUTE: 50.6 10*3/uL (ref 19.0–186.0)
RETIC CT PCT: 1.5 % (ref 0.4–3.1)

## 2016-09-25 LAB — I-STAT CHEM 8, ED
BUN: 19 mg/dL (ref 6–20)
CALCIUM ION: 1.04 mmol/L — AB (ref 1.15–1.40)
CHLORIDE: 110 mmol/L (ref 101–111)
Creatinine, Ser: 0.7 mg/dL (ref 0.44–1.00)
Glucose, Bld: 30 mg/dL — CL (ref 65–99)
HEMATOCRIT: 25 % — AB (ref 36.0–46.0)
HEMOGLOBIN: 8.5 g/dL — AB (ref 12.0–15.0)
POTASSIUM: 2.4 mmol/L — AB (ref 3.5–5.1)
SODIUM: 146 mmol/L — AB (ref 135–145)
TCO2: 24 mmol/L (ref 0–100)

## 2016-09-25 LAB — MAGNESIUM: Magnesium: 0.9 mg/dL — CL (ref 1.7–2.4)

## 2016-09-25 LAB — POC OCCULT BLOOD, ED: Fecal Occult Bld: NEGATIVE

## 2016-09-25 LAB — URINALYSIS, ROUTINE W REFLEX MICROSCOPIC
Bilirubin Urine: NEGATIVE
Glucose, UA: NEGATIVE mg/dL
Hgb urine dipstick: NEGATIVE
Ketones, ur: 5 mg/dL — AB
Leukocytes, UA: NEGATIVE
Nitrite: NEGATIVE
Protein, ur: NEGATIVE mg/dL
Specific Gravity, Urine: 1.026 (ref 1.005–1.030)
pH: 5 (ref 5.0–8.0)

## 2016-09-25 LAB — CBG MONITORING, ED
GLUCOSE-CAPILLARY: 28 mg/dL — AB (ref 65–99)
Glucose-Capillary: 20 mg/dL — CL (ref 65–99)
Glucose-Capillary: 218 mg/dL — ABNORMAL HIGH (ref 65–99)
Glucose-Capillary: 112 mg/dL — ABNORMAL HIGH (ref 65–99)

## 2016-09-25 LAB — I-STAT TROPONIN, ED: Troponin i, poc: 0 ng/mL (ref 0.00–0.08)

## 2016-09-25 LAB — TYPE AND SCREEN
ABO/RH(D): B POS
ANTIBODY SCREEN: NEGATIVE

## 2016-09-25 LAB — CK: Total CK: 199 U/L (ref 38–234)

## 2016-09-25 LAB — APTT: aPTT: 32 seconds (ref 24–36)

## 2016-09-25 LAB — LIPASE, BLOOD: LIPASE: 18 U/L (ref 11–51)

## 2016-09-25 MED ORDER — MAGNESIUM SULFATE 2 GM/50ML IV SOLN
2.0000 g | Freq: Once | INTRAVENOUS | Status: AC
  Administered 2016-09-26: 2 g via INTRAVENOUS
  Filled 2016-09-25: qty 50

## 2016-09-25 MED ORDER — SODIUM CHLORIDE 0.9 % IV SOLN
Freq: Once | INTRAVENOUS | Status: AC
  Administered 2016-09-25: via INTRAVENOUS

## 2016-09-25 MED ORDER — CALCIUM CARBONATE-VITAMIN D 500-200 MG-UNIT PO TABS
1.0000 | ORAL_TABLET | Freq: Every day | ORAL | Status: DC
  Administered 2016-09-26 – 2016-09-28 (×3): 1 via ORAL
  Filled 2016-09-25 (×3): qty 1

## 2016-09-25 MED ORDER — ONDANSETRON HCL 4 MG/2ML IJ SOLN: 4.0000 mg | Freq: Four times a day (QID) | INTRAMUSCULAR | Status: DC | PRN

## 2016-09-25 MED ORDER — ACETAMINOPHEN 325 MG PO TABS: 650.0000 mg | ORAL_TABLET | Freq: Four times a day (QID) | ORAL | Status: DC | PRN

## 2016-09-25 MED ORDER — OMEGA-3-ACID ETHYL ESTERS 1 G PO CAPS
1.0000 g | ORAL_CAPSULE | Freq: Every day | ORAL | Status: DC
  Administered 2016-09-26 – 2016-09-28 (×3): 1 g via ORAL
  Filled 2016-09-25 (×3): qty 1

## 2016-09-25 MED ORDER — MIRTAZAPINE 7.5 MG PO TABS
7.5000 mg | ORAL_TABLET | Freq: Every day | ORAL | Status: DC
  Administered 2016-09-26 – 2016-09-27 (×3): 7.5 mg via ORAL
  Filled 2016-09-25 (×3): qty 1

## 2016-09-25 MED ORDER — DEXTROSE 50 % IV SOLN
INTRAVENOUS | Status: AC
  Administered 2016-09-25: 50 mL
  Filled 2016-09-25: qty 50

## 2016-09-25 MED ORDER — LEVOTHYROXINE SODIUM 88 MCG PO TABS
88.0000 ug | ORAL_TABLET | Freq: Every day | ORAL | Status: DC
  Administered 2016-09-26 – 2016-09-28 (×3): 88 ug via ORAL
  Filled 2016-09-25 (×3): qty 1

## 2016-09-25 MED ORDER — ONDANSETRON HCL 4 MG PO TABS: 4.0000 mg | ORAL_TABLET | Freq: Four times a day (QID) | ORAL | Status: DC | PRN

## 2016-09-25 MED ORDER — VITAMIN C 500 MG PO TABS
500.0000 mg | ORAL_TABLET | Freq: Every day | ORAL | Status: DC
  Administered 2016-09-26 – 2016-09-28 (×3): 500 mg via ORAL
  Filled 2016-09-25 (×3): qty 1

## 2016-09-25 MED ORDER — PRAVASTATIN SODIUM 20 MG PO TABS
20.0000 mg | ORAL_TABLET | Freq: Every day | ORAL | Status: DC
  Administered 2016-09-26 – 2016-09-27 (×2): 20 mg via ORAL
  Filled 2016-09-25 (×2): qty 1

## 2016-09-25 MED ORDER — SODIUM CHLORIDE 0.9% FLUSH
3.0000 mL | Freq: Two times a day (BID) | INTRAVENOUS | Status: DC
  Administered 2016-09-25: 3 mL via INTRAVENOUS

## 2016-09-25 MED ORDER — QUETIAPINE FUMARATE 50 MG PO TABS
50.0000 mg | ORAL_TABLET | Freq: Every day | ORAL | Status: DC
  Administered 2016-09-26 – 2016-09-27 (×3): 50 mg via ORAL
  Filled 2016-09-25: qty 2
  Filled 2016-09-25: qty 1
  Filled 2016-09-25: qty 2
  Filled 2016-09-25 (×2): qty 1

## 2016-09-25 MED ORDER — DEXTROSE 50 % IV SOLN: 50.0000 mL | INTRAVENOUS | Status: DC | PRN

## 2016-09-25 MED ORDER — ACETAMINOPHEN 650 MG RE SUPP: 650.0000 mg | Freq: Four times a day (QID) | RECTAL | Status: DC | PRN

## 2016-09-25 MED ORDER — ADULT MULTIVITAMIN W/MINERALS CH
1.0000 | ORAL_TABLET | Freq: Every day | ORAL | Status: DC
  Administered 2016-09-26 – 2016-09-28 (×3): 1 via ORAL
  Filled 2016-09-25 (×3): qty 1

## 2016-09-25 MED ORDER — ASPIRIN EC 81 MG PO TBEC
81.0000 mg | DELAYED_RELEASE_TABLET | Freq: Every day | ORAL | Status: DC
  Administered 2016-09-26 – 2016-09-28 (×3): 81 mg via ORAL
  Filled 2016-09-25 (×3): qty 1

## 2016-09-25 MED ORDER — IOPAMIDOL (ISOVUE-300) INJECTION 61%
INTRAVENOUS | Status: AC
  Filled 2016-09-25: qty 100

## 2016-09-25 MED ORDER — POTASSIUM CHLORIDE 20 MEQ/15ML (10%) PO SOLN
40.0000 meq | Freq: Once | ORAL | Status: AC
  Administered 2016-09-26: 40 meq via ORAL
  Filled 2016-09-25: qty 30

## 2016-09-25 MED ORDER — DIVALPROEX SODIUM 125 MG PO CSDR
125.0000 mg | DELAYED_RELEASE_CAPSULE | Freq: Every day | ORAL | Status: DC
  Administered 2016-09-26 – 2016-09-27 (×3): 125 mg via ORAL
  Filled 2016-09-25 (×3): qty 1

## 2016-09-25 MED ORDER — ACETAMINOPHEN 325 MG PO TABS
650.0000 mg | ORAL_TABLET | Freq: Once | ORAL | Status: AC
  Administered 2016-09-25: 650 mg via ORAL
  Filled 2016-09-25: qty 2

## 2016-09-25 MED ORDER — VITAMIN D3 25 MCG (1000 UNIT) PO TABS
1000.0000 [IU] | ORAL_TABLET | Freq: Every day | ORAL | Status: DC
  Administered 2016-09-26 – 2016-09-28 (×3): 1000 [IU] via ORAL
  Filled 2016-09-25 (×3): qty 1

## 2016-09-25 MED ORDER — VITAMIN B-12 1000 MCG PO TABS
1000.0000 ug | ORAL_TABLET | Freq: Every day | ORAL | Status: DC
  Administered 2016-09-26 – 2016-09-28 (×3): 1000 ug via ORAL
  Filled 2016-09-25 (×3): qty 1

## 2016-09-25 MED ORDER — IBUPROFEN 200 MG PO TABS: 600.0000 mg | ORAL_TABLET | Freq: Four times a day (QID) | ORAL | Status: DC | PRN

## 2016-09-25 MED ORDER — HYDRALAZINE HCL 20 MG/ML IJ SOLN: 5.0000 mg | INTRAMUSCULAR | Status: DC | PRN

## 2016-09-25 MED ORDER — DONEPEZIL HCL 5 MG PO TABS
10.0000 mg | ORAL_TABLET | Freq: Every day | ORAL | Status: DC
  Administered 2016-09-26 – 2016-09-27 (×3): 10 mg via ORAL
  Filled 2016-09-25 (×3): qty 2

## 2016-09-25 MED ORDER — SODIUM CHLORIDE 0.9 % IV SOLN
Freq: Once | INTRAVENOUS | Status: AC
  Administered 2016-09-25: 18:00:00 via INTRAVENOUS

## 2016-09-25 MED ORDER — SODIUM CHLORIDE 0.9 % IV SOLN
30.0000 meq | Freq: Once | INTRAVENOUS | Status: AC
  Administered 2016-09-25: 30 meq via INTRAVENOUS
  Filled 2016-09-25: qty 15

## 2016-09-25 MED ORDER — OXYCODONE-ACETAMINOPHEN 5-325 MG PO TABS
1.0000 | ORAL_TABLET | ORAL | Status: DC | PRN
  Administered 2016-09-26 – 2016-09-27 (×3): 1 via ORAL
  Filled 2016-09-25 (×3): qty 1

## 2016-09-25 MED ORDER — IOPAMIDOL (ISOVUE-300) INJECTION 61%
100.0000 mL | Freq: Once | INTRAVENOUS | Status: AC | PRN
  Administered 2016-09-25: 100 mL via INTRAVENOUS

## 2016-09-25 NOTE — ED Triage Notes (Signed)
Per EMS:  Pt from home. Larey Seat, unsure of time, her aid found her on the ground. Unknown location in house. Pt is severely confused and hard of hearing.  EMS found her sitting in recliner guarding her right wrist. Complaint of back pain. No visual deformity to back. Laceration on shoulder.  Alert to self only. Non-combative with EMS. Not extremely verbal.  Vitals: 110/72 Pulse 88 O2 96 on RA CBG 177 RR 20

## 2016-09-25 NOTE — ED Notes (Signed)
Consent obtained from pts son POA. Placed at bedside.

## 2016-09-25 NOTE — ED Notes (Signed)
Per PA, ice pack applied to patient's right wrist.

## 2016-09-25 NOTE — ED Notes (Signed)
Patient transported to CT 

## 2016-09-25 NOTE — ED Provider Notes (Signed)
Demented female found down at home. Lives lives alone. Ambulatory to the walker. Family checks on her nightly and has previously tried to place her into a skilled nursing facility. Patient is progressively worsening dementia and is very hard of hearing. She does not even remember falling. She has a right hand fracture and a hematoma on her right shoulder. She is right-hand dominant and uses a walker to ambulate. Currently, she is unsafe to ambulate with her walker. The patient's lab work has not been collected and we are awaiting that at this time.   9:27 PM Patient without completed blood count. I have discussed this with the charge nurse here. Please read ordered the lab. I personally tried to contact the lab twice without any when picking up the phone. The nurse also states that she has tried to call 3 times. Patient sat here waiting for the lab. Abdomen rechecking her, and I noted that the patient appeared extremely pale on my examination. Her chem 8 shows that she has a hemoglobin of 8.5. Her last hemoglobin in my chart was in July 2014 and showed a hemoglobin of 12, however, I do not have any more recent lab values. I do not have any recent complete blood count on the patient but we will obtain a point of care stool on the patient. Potentially her fall was secondary to acute anemia. The patient is unable to articulate this. At this time.   9:56 PM patient is hypokalemic and anemic. Her FOBT is negative   10:34 PM Vitals patient admitted by Dr.Niu Patient does have a critically low potassium of 0.9 and her magnesium is also 2.4. She is receiving 30 mg of IV potassium and 2 g of IV magnesium. At this time.     Arthor Captain, PA-C 09/26/16 1234    Jacalyn Lefevre, MD 09/27/16 [phone]

## 2016-09-25 NOTE — ED Notes (Signed)
Attempted to redraw CBC off pts IV-unsuccessful. Lab/phlebotomy called.

## 2016-09-25 NOTE — ED Provider Notes (Signed)
[facility]-EMERGENCY DEPT Provider Note   CSN: [id number] Arrival date & time: 09/25/16  1208     History   Chief Complaint Chief Complaint  Patient presents with  . Fall    HPI Sheena [name] is a 76 y.o. female.  HPI Sheena [name] is a 76 y.o. female with history of dementia, diabetes, hypertension, presents to emergency department complaining of a fall. She was last seen by her family last night at 42 PM. She was found this morning by a home health nurse on the floor in her bedroom. Patient lives alone. She was noted to still be wearing her pajamas. It is unclear when she fell, patient does not recall falling. Patient is complaining of back pain and pain to her wrist. Patient is also was found to have a contusion to the right shoulder. Patient unable to provide any more history. She is not anticoagulated. She does have history of falls but has not had a fall in several months.  Past Medical History:  Diagnosis Date  . Dementia   . Diabetes mellitus   . Hypertension     Patient Active Problem List   Diagnosis Date Noted  . Psychosis 12/22/2012  . High anion gap metabolic acidosis 12/17/2012  . DKA (diabetic ketoacidoses) (HCC) 12/17/2012  . Altered mental status 12/17/2012  . Severe Diabetic gastroparesis 12/17/2012  . H/O diarrhea 12/17/2012  . Dementia 12/17/2012  . Hypothyroidism 12/17/2012  . Diabetes mellitus, type 2 (HCC) 12/17/2012  . Hypokalemia 12/17/2012  . Dehydration 12/17/2012  . Chronic pain 12/17/2012    History reviewed. No pertinent surgical history.  OB History    No data available       Home Medications    Prior to Admission medications   Medication Sig Start Date End Date Taking? Authorizing Provider  aspirin EC 81 MG tablet Take 81 mg by mouth daily.      Historical Provider, MD  calcitonin, salmon, (MIACALCIN/FORTICAL) 200 UNIT/ACT nasal spray Place 1 spray into the nose daily.    Historical Provider, MD  Calcium Carbonate-Vitamin D  (CALCIUM-VITAMIN D) 600-200 MG-UNIT CAPS Take 1 tablet by mouth daily.    Historical Provider, MD  cholecalciferol (VITAMIN D) 1000 UNITS tablet Take 1,000 Units by mouth daily.    Historical Provider, MD  divalproex (DEPAKOTE SPRINKLE) 125 MG capsule Take 1 capsule (125 mg total) by mouth every 12 (twelve) hours. 12/23/12   Georgann Housekeeper, MD  donepezil (ARICEPT) 10 MG tablet Take 10 mg by mouth at bedtime.      Historical Provider, MD  fish oil-omega-3 fatty acids 1000 MG capsule Take 1 g by mouth daily.    Historical Provider, MD  ibuprofen (ADVIL,MOTRIN) 200 MG tablet Take 600 mg by mouth every 6 (six) hours as needed. pain    Historical Provider, MD  insulin glargine (LANTUS) 100 UNIT/ML injection Inject 0.1 mLs (10 Units total) into the skin at bedtime. Injects 10 units in the morning and 20 units at night 12/19/12   Georgann Housekeeper, MD  levothyroxine (SYNTHROID, LEVOTHROID) 75 MCG tablet Take 75 mcg by mouth daily before breakfast.    Historical Provider, MD  lidocaine (LIDODERM) 5 % Place 1 patch onto the skin daily. Remove & Discard patch within 12 hours or as directed by MD    Historical Provider, MD  lovastatin (MEVACOR) 20 MG tablet Take 20 mg by mouth at bedtime.    Historical Provider, MD  magnesium chloride (SLOW-MAG) 64 MG TBEC Take 2 tablets (128 mg  total) by mouth daily. 12/23/12   Georgann Housekeeper, MD  metFORMIN (GLUCOPHAGE) 500 MG tablet Take 1 tablet (500 mg total) by mouth 2 (two) times daily with a meal. 12/22/12   Georgann Housekeeper, MD  mirtazapine (REMERON) 7.5 MG tablet Take 7.5 mg by mouth at bedtime.      Historical Provider, MD  Multiple Vitamin (MULITIVITAMIN WITH MINERALS) TABS Take 1 tablet by mouth daily.    Historical Provider, MD  potassium chloride (K-DUR,KLOR-CON) 10 MEQ tablet Take 1 tablet (10 mEq total) by mouth 2 (two) times daily. 12/23/12   Georgann Housekeeper, MD  QUEtiapine (SEROQUEL) 50 MG tablet Take 1 tablet (50 mg total) by mouth at bedtime. 12/23/12   Georgann Housekeeper, MD    vitamin B-12 (CYANOCOBALAMIN) 1000 MCG tablet Take 1,000 mcg by mouth daily.    Historical Provider, MD  vitamin C (ASCORBIC ACID) 500 MG tablet Take 500 mg by mouth daily.    Historical Provider, MD    Family History History reviewed. No pertinent family history.  Social History Social History  Substance Use Topics  . Smoking status: Never Smoker  . Smokeless tobacco: Never Used  . Alcohol use No     Allergies   Bactrim   Review of Systems Review of Systems  Unable to perform ROS: Dementia     Physical Exam Updated Vital Signs BP (!) 188/78   Pulse 85   Temp 97.9 F (36.6 C) (Oral)   Resp 18   SpO2 96%   Physical Exam  Constitutional: She appears well-developed and well-nourished. No distress.  HENT:  Head: Normocephalic.  Eyes: Conjunctivae are normal.  Neck: Neck supple.  Cardiovascular: Normal rate, regular rhythm and normal heart sounds.   Pulmonary/Chest: Effort normal and breath sounds normal. No respiratory distress. She has no wheezes. She has no rales.  Abdominal: Soft. Bowel sounds are normal. She exhibits no distension. There is no tenderness. There is no rebound.  Musculoskeletal: She exhibits no edema.  There is a contusion to the right lateral shoulder. No tenderness to palpation. No pain with range of motion of the shoulder. There is bruising and swelling to the right wrist. No obvious deformity. Distal radial pulse intact. Patient is able to move all fingers. Cap refill less than 2 seconds distally. Full range of motion of bilateral hips with no pain. Normal-appearing knees and ankles. There is to palpation over midline lumbar spine.  Neurological: She is alert.  Able to move all 4 extremities purposefully. Oriented to self. Following directions. Exam difficult due to confusion which is at baseline and difficulty of hearing.   Skin: Skin is warm and dry.  Psychiatric: She has a normal mood and affect. Her behavior is normal.  Nursing note and  vitals reviewed.    ED Treatments / Results  Labs (all labs ordered are listed, but only abnormal results are displayed) Labs Reviewed  BASIC METABOLIC PANEL - Abnormal; Notable for the following:       Result Value   Glucose, Bld 102 (*)    BUN 23 (*)    GFR calc non Af Amer 58 (*)    All other components within normal limits  CK  CBC WITH DIFFERENTIAL/PLATELET  URINALYSIS, ROUTINE W REFLEX MICROSCOPIC  CBC WITH DIFFERENTIAL/PLATELET  I-STAT TROPOININ, ED    EKG  EKG Interpretation None       Radiology Dg Lumbar Spine Complete  Result Date: 09/25/2016 CLINICAL DATA:  Status post unwitnessed fall at home. EXAM: LUMBAR SPINE - COMPLETE  4+ VIEW COMPARISON:  Lumbar spine series of November 28, 2011 FINDINGS: The patient has undergone previous kyphoplasty at L1 and L3. Partial compressions at these levels are stable. There is partial compression of L4 which is not being treated. This is also stable. L2 and L5 exhibit normal vertebral body height. There is no spondylolisthesis. The pedicles of L2 and L4 and L5 are grossly normal where visualized. The observed portions of the sacrum exhibit no acute abnormalities. There is gentle curvature of the lumbar spine convex toward the right. IMPRESSION: Chronic changes within the lumbar spine. No acute fracture or dislocation. Electronically Signed   By: David  [place] M.D.   On: 09/25/2016 15:18   Dg Pelvis 1-2 Views  Result Date: 09/25/2016 CLINICAL DATA:  Status post fall. Patient reports low back and tail bone pain EXAM: PELVIS - 1-2 VIEW COMPARISON:  Limited views of the pelvis for all lumbar spine series of March 11, 2011. FINDINGS: The bony pelvis is subjectively adequately mineralized the iliac bones appear intact. The observed portions of the sacrum and SI joints exhibit no acute abnormalities. There is a stable sclerotic focus in inferior aspect of the left iliac bone. Old fractures of the pubic rami bilaterally are visible. No acute  pubic ramus fracture is observed. There is mild narrowing of the right hip joint space medially. The left hip joint space is reasonably well maintained. The femoral heads, necks, intertrochanteric, and immediate sub trochanteric regions appear normal. IMPRESSION: Chronic deformity of the pubic rami consistent with previous fractures. No acute pelvic fracture is observed. The observed portions of the hips reveal no acute abnormalities. Electronically Signed   By: David  [place] M.D.   On: 09/25/2016 15:16   Dg Shoulder Right  Result Date: 09/25/2016 CLINICAL DATA:  Status post fall.  Right shoulder pain. EXAM: RIGHT SHOULDER - 2+ VIEW COMPARISON:  None. FINDINGS: There is no evidence of fracture or dislocation. There is generalized osteopenia. The Soft tissues are unremarkable. IMPRESSION: No acute osseous injury of the right shoulder. Electronically Signed   By: Elige Ko   On: 09/25/2016 15:15   Dg Wrist Complete Right  Result Date: 09/25/2016 CLINICAL DATA:  Status post fall.  Erythematous swollen wrist. EXAM: RIGHT WRIST - COMPLETE 3+ VIEW COMPARISON:  None in PACs FINDINGS: The bones are subjectively osteopenic. There is a impacted nondisplaced fracture of the distal radial metaphysis. There is an ulnar styloid fracture. The carpal bones are grossly intact. There is mild narrowing of the radiocarpal joint. There is mild degenerative change of the first [facility] joint. There is calcification of the triangular fibrocartilage. The metacarpals appear intact. IMPRESSION: Nondisplaced impacted fracture of the distal right radial metaphysis. Minimally distracted ulnar styloid fracture. Electronically Signed   By: David  [place] M.D.   On: 09/25/2016 15:11   Ct Head Wo Contrast  Result Date: 09/25/2016 CLINICAL DATA:  Recent fall EXAM: CT HEAD WITHOUT CONTRAST CT CERVICAL SPINE WITHOUT CONTRAST TECHNIQUE: Multidetector CT imaging of the head and cervical spine was performed following the standard protocol  without intravenous contrast. Multiplanar CT image reconstructions of the cervical spine were also generated. COMPARISON:  12/16/2012 FINDINGS: CT HEAD FINDINGS Brain: Mild atrophic changes and chronic white matter ischemic change is seen. No findings to suggest acute hemorrhage, acute infarction or space-occupying mass lesion are noted. Vascular: No hyperdense vessel or unexpected calcification. Skull: Postsurgical changes are noted in the mastoid air cells on the right. A cochlear implantation device is seen on the right. No acute fracture  is noted. Sinuses/Orbits: No acute finding. Other: None. CT CERVICAL SPINE FINDINGS Alignment: Within normal limits Skull base and vertebrae: 7 cervical vertebra are well visualized. Vertebral body height is well maintained. Osteophytic changes are noted at multiple levels as well as in the upper thoracic levels. Mild facet hypertrophic changes are seen. No findings to suggest acute fracture or acute facet abnormality are noted. Soft tissues and spinal canal: Heavy carotid calcifications are noted bilaterally with changes suggestive of high-grade stenosis. The need for further evaluation can be determined on a clinical basis. Disc levels:  Disc space narrowing is noted at C5-6. Upper chest: Within normal limits. IMPRESSION: CT of the head: Chronic changes without acute abnormality. CT of the cervical spine: Degenerative change without acute abnormality. Electronically Signed   By: Alcide Clever M.D.   On: 09/25/2016 14:51   Ct Cervical Spine Wo Contrast  Result Date: 09/25/2016 CLINICAL DATA:  Recent fall EXAM: CT HEAD WITHOUT CONTRAST CT CERVICAL SPINE WITHOUT CONTRAST TECHNIQUE: Multidetector CT imaging of the head and cervical spine was performed following the standard protocol without intravenous contrast. Multiplanar CT image reconstructions of the cervical spine were also generated. COMPARISON:  12/16/2012 FINDINGS: CT HEAD FINDINGS Brain: Mild atrophic changes and  chronic white matter ischemic change is seen. No findings to suggest acute hemorrhage, acute infarction or space-occupying mass lesion are noted. Vascular: No hyperdense vessel or unexpected calcification. Skull: Postsurgical changes are noted in the mastoid air cells on the right. A cochlear implantation device is seen on the right. No acute fracture is noted. Sinuses/Orbits: No acute finding. Other: None. CT CERVICAL SPINE FINDINGS Alignment: Within normal limits Skull base and vertebrae: 7 cervical vertebra are well visualized. Vertebral body height is well maintained. Osteophytic changes are noted at multiple levels as well as in the upper thoracic levels. Mild facet hypertrophic changes are seen. No findings to suggest acute fracture or acute facet abnormality are noted. Soft tissues and spinal canal: Heavy carotid calcifications are noted bilaterally with changes suggestive of high-grade stenosis. The need for further evaluation can be determined on a clinical basis. Disc levels:  Disc space narrowing is noted at C5-6. Upper chest: Within normal limits. IMPRESSION: CT of the head: Chronic changes without acute abnormality. CT of the cervical spine: Degenerative change without acute abnormality. Electronically Signed   By: Alcide Clever M.D.   On: 09/25/2016 14:51    Procedures Procedures (including critical care time)  Medications Ordered in ED Medications - No data to display   Initial Impression / Assessment and Plan / ED Course  I have reviewed the triage vital signs and the nursing notes.  Pertinent labs & imaging results that were available during my care of the patient were reviewed by me and considered in my medical decision making (see chart for details).     Pt here after a fall, unknown time of the fall. Pt with bruising and pain to right wrist, right shoulder, pain to lower back. Will check labs, get imaging.   5:35 PM Pt's xrays urnemarkable except for right wrist fracture. Pt in  NAD but does appear very confused. She is right handed, concerning since pt ambulates with a walker and unable to do so at this time. Pending UA and recollect CBC. Apparently sample clotted and lab did not notify anyone about this. Will recollect.   Pt signed out to PA Harris at shift change pending UA and CBC. Plan admission vs placement since unable to safely ambulate.   Final Clinical  Impressions(s) / ED Diagnoses   Final diagnoses:  None    New Prescriptions New Prescriptions   No medications on file     Jaynie Crumble, PA-C 09/27/16 1140    Heide Scales, MD 10/01/16 [phone]

## 2016-09-25 NOTE — ED Notes (Signed)
Bed: WA09 Expected date:  Expected time:  Means of arrival:  Comments: EMS 76 y/o fall

## 2016-09-25 NOTE — ED Notes (Signed)
ED Provider at bedside. 

## 2016-09-25 NOTE — ED Notes (Signed)
Family at bedside. 

## 2016-09-25 NOTE — ED Notes (Signed)
Lab/Phlebotomy  called again to check on blood draw. Told they would be here shortly.

## 2016-09-25 NOTE — H&P (Signed)
History and Physical    Sheena [name]:[id number] DOB: 1940-07-15 DOA: 09/25/2016  Referring MD/NP/PA:   PCP: Georgann Housekeeper, MD   Patient coming from:  The patient is coming from home.  At baseline, pt is partially dependent for most of ADL.   Chief Complaint: fall, AMS, nausea, vomiting, abdominal pain  HPI: Sheena [name] is a 76 y.o. female with medical history significant of dementia, hypertension, hyperlipidemia, diabetes mellitus, hypothyroidism, depression, poor hearing, who presents with fall, altered mental status, nausea, vomiting, abdominal pain.  Per her son, pt had unwitnessed fall, but he is unsure of time. Pt's aid found her on the ground in this morning. Pt is more confused than her baseline. She has right wrist pain and back pain. No visual deformity to back. She has nausea and vomited once. Her son states that the patient has diarrhea occasionally, but not today. Patient also has abdominal tenderness on examination. She moves all extremities normally. No facial droop, slurred speech. .  ED Course: pt was found to have WBC 7.5, hypoglycemia with blood sugar 30 which improved to 218 after given D50, negative troponin, potassium 2.5, magnesium 0.9, creatinine normal, temperature normal, O2 sat 90-98% on room air, hemoglobin 8.7 which was 12.0 on 12/19/12. Negative FOBT. CK 199. CT head and CT of C-spine is negative for acute issues. X-ray of lumbar spine, pelvis and right shoulder is negative for acute bony fracture. X-ray of her right wrist showed nondisplaced impacted fracture of the distal right radial metaphysis and minimally distracted ulnar styloid fracture. Pt is admitted to telemetry bed as inpatient.  Review of Systems: Could not be reviewed accurately due to dementia and altered mental status.  Allergy:  Allergies  Allergen Reactions  . Bactrim Nausea And Vomiting    Past Medical History:  Diagnosis Date  . Dementia   . Diabetes mellitus   . Hypertension      Past Surgical History:  Procedure Laterality Date  . BACK SURGERY    . INNER EAR SURGERY    . NOSE SURGERY      Social History:  reports that she has never smoked. She has never used smokeless tobacco. She reports that she does not drink alcohol or use drugs.  Family History:  Family History  Problem Relation Age of Onset  . Dementia Mother      Prior to Admission medications   Medication Sig Start Date End Date Taking? Authorizing Provider  aspirin EC 81 MG tablet Take 81 mg by mouth daily.     Yes Historical Provider, MD  Calcium Carbonate-Vitamin D (CALCIUM-VITAMIN D) 600-200 MG-UNIT CAPS Take 1 tablet by mouth daily.   Yes Historical Provider, MD  cholecalciferol (VITAMIN D) 1000 UNITS tablet Take 1,000 Units by mouth daily.   Yes Historical Provider, MD  divalproex (DEPAKOTE SPRINKLE) 125 MG capsule Take 125 mg by mouth at bedtime.  07/10/16  Yes Historical Provider, MD  donepezil (ARICEPT) 10 MG tablet Take 10 mg by mouth at bedtime.     Yes Historical Provider, MD  fish oil-omega-3 fatty acids 1000 MG capsule Take 1 g by mouth daily.   Yes Historical Provider, MD  insulin aspart (NOVOLOG) 100 UNIT/ML injection Inject 0-10 Units into the skin 3 (three) times daily as needed for high blood sugar.   Yes Historical Provider, MD  insulin glargine (LANTUS) 100 UNIT/ML injection Inject 0.1 mLs (10 Units total) into the skin at bedtime. Injects 10 units in the morning and 20 units at night Patient  taking differently: Inject 20 Units into the skin 2 (two) times daily.  12/19/12  Yes Georgann Housekeeper, MD  levothyroxine (SYNTHROID, LEVOTHROID) 88 MCG tablet Take 88 mcg by mouth daily before breakfast.  07/10/16  Yes Historical Provider, MD  lovastatin (MEVACOR) 20 MG tablet Take 20 mg by mouth at bedtime.   Yes Historical Provider, MD  metFORMIN (GLUCOPHAGE) 500 MG tablet Take 1 tablet (500 mg total) by mouth 2 (two) times daily with a meal. Patient taking differently: Take 1,000 mg by mouth  2 (two) times daily with a meal.  12/22/12  Yes Georgann Housekeeper, MD  mirtazapine (REMERON) 7.5 MG tablet Take 7.5 mg by mouth at bedtime.     Yes Historical Provider, MD  Multiple Vitamin (MULITIVITAMIN WITH MINERALS) TABS Take 1 tablet by mouth daily.   Yes Historical Provider, MD  QUEtiapine (SEROQUEL) 50 MG tablet Take 1 tablet (50 mg total) by mouth at bedtime. 12/23/12  Yes Georgann Housekeeper, MD  vitamin B-12 (CYANOCOBALAMIN) 1000 MCG tablet Take 1,000 mcg by mouth daily.   Yes Historical Provider, MD  vitamin C (ASCORBIC ACID) 500 MG tablet Take 500 mg by mouth daily.   Yes Historical Provider, MD  divalproex (DEPAKOTE SPRINKLE) 125 MG capsule Take 1 capsule (125 mg total) by mouth every 12 (twelve) hours. Patient not taking: Reported on 09/25/2016 12/23/12   Georgann Housekeeper, MD  ibuprofen (ADVIL,MOTRIN) 200 MG tablet Take 600 mg by mouth every 6 (six) hours as needed. pain    Historical Provider, MD  magnesium chloride (SLOW-MAG) 64 MG TBEC Take 2 tablets (128 mg total) by mouth daily. Patient not taking: Reported on 09/25/2016 12/23/12   Georgann Housekeeper, MD  potassium chloride (K-DUR,KLOR-CON) 10 MEQ tablet Take 1 tablet (10 mEq total) by mouth 2 (two) times daily. Patient not taking: Reported on 09/25/2016 12/23/12   Georgann Housekeeper, MD    Physical Exam: Vitals:   09/25/16 2230 09/25/16 2320 09/25/16 2344 09/26/16 0120  BP: (!) 155/70 (!) 155/70  (!) 138/94  Pulse: 83 87  81  Resp: Temp:   97.6 F (36.4 C) 97.2 F (36.2 C)  TempSrc:    Oral  SpO2: 91% 97%  100%  Weight:    60.3 kg (132 lb 15 oz)   General: Not in acute distress HEENT:       Eyes: PERRL, EOMI, no scleral icterus.       ENT: No discharge from the ears and nose, no pharynx injection, no tonsillar enlargement.        Neck: No JVD, no bruit, no mass felt. Heme: No neck lymph node enlargement. Cardiac: S1/S2, RRR, No murmurs, No gallops or rubs. Respiratory: No rales, wheezing, rhonchi or rubs. GI: Soft, nondistended,  nontender, no rebound pain, no organomegaly, BS present. GU: No hematuria Ext: No pitting leg edema bilaterally. 2+DP/PT pulse bilaterally. Musculoskeletal: has right wrist tenderness. Skin: No rashes. Has skin abrasion in right shoulder Neuro: confused, not oriented X3, cranial nerves II-XII grossly intact, moves all extremities normally. Psych: Patient is not psychotic.  Labs on Admission: I have personally reviewed following labs and imaging studies  CBC:  Recent Labs Lab 09/25/16 2107 09/25/16 2116  WBC 7.5  --   NEUTROABS 5.6  --   HGB 8.7* 8.5*  HCT 27.6* 25.0*  MCV 81.9  --   PLT 203  --    Basic Metabolic Panel:  Recent Labs Lab 09/25/16 1527 09/25/16 2106 09/25/16 2116  NA 140 143 146*  K 3.7 2.4* 2.4*  CL 103 113* 110  CO2 25 23  --   GLUCOSE 102* 29* 30*  BUN 23* 20 19  CREATININE 0.93 0.69 0.70  CALCIUM 9.3 7.5*  --   MG  --  0.9*  --    GFR: CrCl cannot be calculated (Unknown ideal weight.). Liver Function Tests: No results for input(s): AST, ALT, ALKPHOS, BILITOT, PROT, ALBUMIN in the last 168 hours.  Recent Labs Lab 09/25/16 2158  LIPASE 18   No results for input(s): AMMONIA in the last 168 hours. Coagulation Profile:  Recent Labs Lab 09/25/16 2158  INR 1.13   Cardiac Enzymes:  Recent Labs Lab 09/25/16 1527  CKTOTAL 199   BNP (last 3 results) No results for input(s): PROBNP in the last 8760 hours. HbA1C: No results for input(s): HGBA1C in the last 72 hours. CBG:  Recent Labs Lab 09/25/16 2108 09/25/16 2111 09/25/16 2123 09/25/16 2320 09/26/16 0108  GLUCAP 20* 28* 218* 112* 70   Lipid Profile: No results for input(s): CHOL, HDL, LDLCALC, TRIG, CHOLHDL, LDLDIRECT in the last 72 hours. Thyroid Function Tests: No results for input(s): TSH, T4TOTAL, FREET4, T3FREE, THYROIDAB in the last 72 hours. Anemia Panel:  Recent Labs  09/25/16 2255  RETICCTPCT 1.5   Urine analysis:    Component Value Date/Time   COLORURINE  YELLOW 09/25/2016 1329   APPEARANCEUR CLEAR 09/25/2016 1329   LABSPEC 1.026 09/25/2016 1329   PHURINE 5.0 09/25/2016 1329   GLUCOSEU NEGATIVE 09/25/2016 1329   HGBUR NEGATIVE 09/25/2016 1329   BILIRUBINUR NEGATIVE 09/25/2016 1329   KETONESUR 5 (A) 09/25/2016 1329   PROTEINUR NEGATIVE 09/25/2016 1329   UROBILINOGEN 1.0 12/16/2012 1937   NITRITE NEGATIVE 09/25/2016 1329   LEUKOCYTESUR NEGATIVE 09/25/2016 1329   Sepsis Labs: (procalcitonin:4,lacticidven:4) )No results found for this or any previous visit (from the past 240 hour(s)).   Radiological Exams on Admission: Dg Lumbar Spine Complete  Result Date: 09/25/2016 CLINICAL DATA:  Status post unwitnessed fall at home. EXAM: LUMBAR SPINE - COMPLETE 4+ VIEW COMPARISON:  Lumbar spine series of November 28, 2011 FINDINGS: The patient has undergone previous kyphoplasty at L1 and L3. Partial compressions at these levels are stable. There is partial compression of L4 which is not being treated. This is also stable. L2 and L5 exhibit normal vertebral body height. There is no spondylolisthesis. The pedicles of L2 and L4 and L5 are grossly normal where visualized. The observed portions of the sacrum exhibit no acute abnormalities. There is gentle curvature of the lumbar spine convex toward the right. IMPRESSION: Chronic changes within the lumbar spine. No acute fracture or dislocation. Electronically Signed   By: David  [place] M.D.   On: 09/25/2016 15:18   Dg Pelvis 1-2 Views  Result Date: 09/25/2016 CLINICAL DATA:  Status post fall. Patient reports low back and tail bone pain EXAM: PELVIS - 1-2 VIEW COMPARISON:  Limited views of the pelvis for all lumbar spine series of March 11, 2011. FINDINGS: The bony pelvis is subjectively adequately mineralized the iliac bones appear intact. The observed portions of the sacrum and SI joints exhibit no acute abnormalities. There is a stable sclerotic focus in inferior aspect of the left iliac bone. Old  fractures of the pubic rami bilaterally are visible. No acute pubic ramus fracture is observed. There is mild narrowing of the right hip joint space medially. The left hip joint space is reasonably well maintained. The femoral heads, necks, intertrochanteric, and immediate sub trochanteric regions appear normal. IMPRESSION: Chronic  deformity of the pubic rami consistent with previous fractures. No acute pelvic fracture is observed. The observed portions of the hips reveal no acute abnormalities. Electronically Signed   By: David  [place] M.D.   On: 09/25/2016 15:16   Dg Shoulder Right  Result Date: 09/25/2016 CLINICAL DATA:  Status post fall.  Right shoulder pain. EXAM: RIGHT SHOULDER - 2+ VIEW COMPARISON:  None. FINDINGS: There is no evidence of fracture or dislocation. There is generalized osteopenia. The Soft tissues are unremarkable. IMPRESSION: No acute osseous injury of the right shoulder. Electronically Signed   By: Elige Ko   On: 09/25/2016 15:15   Dg Wrist Complete Right  Result Date: 09/25/2016 CLINICAL DATA:  Status post fall.  Erythematous swollen wrist. EXAM: RIGHT WRIST - COMPLETE 3+ VIEW COMPARISON:  None in PACs FINDINGS: The bones are subjectively osteopenic. There is a impacted nondisplaced fracture of the distal radial metaphysis. There is an ulnar styloid fracture. The carpal bones are grossly intact. There is mild narrowing of the radiocarpal joint. There is mild degenerative change of the first [facility] joint. There is calcification of the triangular fibrocartilage. The metacarpals appear intact. IMPRESSION: Nondisplaced impacted fracture of the distal right radial metaphysis. Minimally distracted ulnar styloid fracture. Electronically Signed   By: David  [place] M.D.   On: 09/25/2016 15:11   Ct Head Wo Contrast  Result Date: 09/25/2016 CLINICAL DATA:  Recent fall EXAM: CT HEAD WITHOUT CONTRAST CT CERVICAL SPINE WITHOUT CONTRAST TECHNIQUE: Multidetector CT imaging of the head and  cervical spine was performed following the standard protocol without intravenous contrast. Multiplanar CT image reconstructions of the cervical spine were also generated. COMPARISON:  12/16/2012 FINDINGS: CT HEAD FINDINGS Brain: Mild atrophic changes and chronic white matter ischemic change is seen. No findings to suggest acute hemorrhage, acute infarction or space-occupying mass lesion are noted. Vascular: No hyperdense vessel or unexpected calcification. Skull: Postsurgical changes are noted in the mastoid air cells on the right. A cochlear implantation device is seen on the right. No acute fracture is noted. Sinuses/Orbits: No acute finding. Other: None. CT CERVICAL SPINE FINDINGS Alignment: Within normal limits Skull base and vertebrae: 7 cervical vertebra are well visualized. Vertebral body height is well maintained. Osteophytic changes are noted at multiple levels as well as in the upper thoracic levels. Mild facet hypertrophic changes are seen. No findings to suggest acute fracture or acute facet abnormality are noted. Soft tissues and spinal canal: Heavy carotid calcifications are noted bilaterally with changes suggestive of high-grade stenosis. The need for further evaluation can be determined on a clinical basis. Disc levels:  Disc space narrowing is noted at C5-6. Upper chest: Within normal limits. IMPRESSION: CT of the head: Chronic changes without acute abnormality. CT of the cervical spine: Degenerative change without acute abnormality. Electronically Signed   By: Alcide Clever M.D.   On: 09/25/2016 14:51   Ct Cervical Spine Wo Contrast  Result Date: 09/25/2016 CLINICAL DATA:  Recent fall EXAM: CT HEAD WITHOUT CONTRAST CT CERVICAL SPINE WITHOUT CONTRAST TECHNIQUE: Multidetector CT imaging of the head and cervical spine was performed following the standard protocol without intravenous contrast. Multiplanar CT image reconstructions of the cervical spine were also generated. COMPARISON:  12/16/2012  FINDINGS: CT HEAD FINDINGS Brain: Mild atrophic changes and chronic white matter ischemic change is seen. No findings to suggest acute hemorrhage, acute infarction or space-occupying mass lesion are noted. Vascular: No hyperdense vessel or unexpected calcification. Skull: Postsurgical changes are noted in the mastoid air cells on the right.  A cochlear implantation device is seen on the right. No acute fracture is noted. Sinuses/Orbits: No acute finding. Other: None. CT CERVICAL SPINE FINDINGS Alignment: Within normal limits Skull base and vertebrae: 7 cervical vertebra are well visualized. Vertebral body height is well maintained. Osteophytic changes are noted at multiple levels as well as in the upper thoracic levels. Mild facet hypertrophic changes are seen. No findings to suggest acute fracture or acute facet abnormality are noted. Soft tissues and spinal canal: Heavy carotid calcifications are noted bilaterally with changes suggestive of high-grade stenosis. The need for further evaluation can be determined on a clinical basis. Disc levels:  Disc space narrowing is noted at C5-6. Upper chest: Within normal limits. IMPRESSION: CT of the head: Chronic changes without acute abnormality. CT of the cervical spine: Degenerative change without acute abnormality. Electronically Signed   By: Alcide Clever M.D.   On: 09/25/2016 14:51   Ct Abdomen Pelvis W Contrast  Result Date: 09/26/2016 CLINICAL DATA:  Status post fall, with lower back pain. Concern for abdominal injury. Initial encounter. EXAM: CT ABDOMEN AND PELVIS WITH CONTRAST TECHNIQUE: Multidetector CT imaging of the abdomen and pelvis was performed using the standard protocol following bolus administration of intravenous contrast. CONTRAST:  ISOVUE-300 IOPAMIDOL (ISOVUE-300) INJECTION 61% COMPARISON:  Left hip CT performed 12/2002, and abdominal ultrasound performed 11/21/2010 FINDINGS: Lower chest: Mild bibasilar atelectasis is noted. Scattered coronary  artery calcifications are seen. Hepatobiliary: The liver is unremarkable in appearance. The gallbladder is unremarkable in appearance. The common bile duct remains normal in caliber. Pancreas: The pancreas is within normal limits. Spleen: The spleen is unremarkable in appearance. Adrenals/Urinary Tract: The adrenal glands are unremarkable in appearance. The kidneys are within normal limits. There is no evidence of hydronephrosis. No renal or ureteral stones are identified. No perinephric stranding is seen. Stomach/Bowel: The stomach is unremarkable in appearance. The small bowel is within normal limits. The appendix is not visualized; there is no evidence for appendicitis. The colon is unremarkable in appearance. Vascular/Lymphatic: Diffuse calcification is seen along the abdominal aorta and its branches. There is mild luminal narrowing at the distal abdominal aorta. The inferior vena cava is grossly unremarkable. No retroperitoneal lymphadenopathy is seen. No pelvic sidewall lymphadenopathy is identified. Reproductive: The bladder is mildly distended and grossly unremarkable. The patient is status post hysterectomy. The ovaries are relatively symmetric. No suspicious adnexal masses are seen. Other: No additional soft tissue abnormalities are seen. Musculoskeletal: No acute osseous abnormalities are identified. The patient is status post vertebroplasty at L1 and L3, and chronic compression deformity is also noted at L4. Chronic bilateral superior and inferior rami deformities are noted. The visualized musculature is unremarkable in appearance. IMPRESSION: 1. No evidence of traumatic injury to the abdomen or pelvis. 2. Chronic compression deformity at L4. 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 3. Diffuse aortic atherosclerosis. Mild luminal narrowing at the distal abdominal aorta. 4. Mild bibasilar atelectasis. 5. Scattered coronary artery calcifications seen. Electronically Signed   By: Roanna Raider M.D.   On:  09/26/2016 00:54     EKG: Independently reviewed.  Not done in ED, will get one.   Assessment/Plan Principal Problem:   Wrist fracture, closed, right, initial encounter Active Problems:   Dementia   Hypothyroidism   Diabetes mellitus, type 2 (HCC)   Hypokalemia   Fall   Acute encephalopathy   Hypoglycemia   Normocytic anemia   Abdominal pain   Wrist fracture and fall:  X-ray of her right wrist showed nondisplaced  impacted fracture of the distal right radial metaphysis and minimally distracted ulnar styloid fracture. No neurovascular compromise. Ct-head and C spin negative. Orthopedic surgeon will be consulted (PA, Abgail will call ortho).  - will admit to tele bed as inpt - Pain control: prn percocet - type and cross - INR/PTT and type screen - f/u ortho consultation - pt/ot when able to (not ordered yet)   Nausea, vomiting, abdominal pain. Etiology is not clear. -Check lipase - will get CT abdomen/pelvis -When necessary Zofran for nausea  DM-II: Last A1c not on record.  Patient is taking Lantus and metformin at home. Now patient has hypoglycemia -will hold Lantus and metformin   Hypoglycemia: Likely due to decreased oral intake and continuation of Lantus -CBG every hour -D50 when necessary -D5-1/2 NS at 100 cc/h  Acute encephalopathy: Likely due to hypoglycemia. Urinalysis negative. -Frequent neuro check -Correct hypoglycemia as above  Dementia: -Donepezil  Hypokalemia and hypomagnesemia: K= 2.4 and magnesium 0.9  on admission. - Repleted both  Hypothyroidism: Last TSH was 1.109 on 12/18/12 -Continue home Synthroid  Normocytic anemia: Hemoglobin 8.7 which was at 12.0 on 12/19/12. FOBT negative. -Anemia panel   DVT ppx: SCD Code Status: DNR (I discussed with patient's son and explained the meaning of CODE STATUS. Per her son, patient would wants to be DNR) Family Communication:Yes, patient's  Son and daughter-in law at bed side Disposition Plan:   Anticipate discharge back to previous home environment Consults called:  Orthopedic surgeon will be consulted (PA, Abgail will call ortho)  Admission status: Inpatient/tele    Date of Service 09/26/2016    Lorretta Harp [facility] Pager [phone]  If 7PM-7AM, please contact night-coverage [email or web address] Password [email or web address] 09/26/2016, 1:43 AM

## 2016-09-25 NOTE — ED Notes (Addendum)
Unsucessful IV attempt x2. Kristen RN asked to attempt. Primary RN made aware. IV team consult placed.

## 2016-09-26 ENCOUNTER — Encounter (HOSPITAL_COMMUNITY): Payer: Self-pay

## 2016-09-26 DIAGNOSIS — F0390 Unspecified dementia without behavioral disturbance: Secondary | ICD-10-CM

## 2016-09-26 LAB — MAGNESIUM: MAGNESIUM: 2.1 mg/dL (ref 1.7–2.4)

## 2016-09-26 LAB — LIPID PANEL
CHOLESTEROL: 200 mg/dL (ref 0–200)
HDL: 42 mg/dL (ref >40)
LDL Cholesterol: 121 mg/dL — ABNORMAL HIGH (ref 0–99)
TRIGLYCERIDES: 187 mg/dL — AB (ref <150)
Total CHOL/HDL Ratio: 4.8 RATIO
VLDL: 37 mg/dL (ref 0–40)

## 2016-09-26 LAB — CBC
HCT: 32.2 % — ABNORMAL LOW (ref 36.0–46.0)
HEMOGLOBIN: 10 g/dL — AB (ref 12.0–15.0)
MCH: 25.6 pg — AB (ref 26.0–34.0)
MCHC: 31.1 g/dL (ref 30.0–36.0)
MCV: 82.4 fL (ref 78.0–100.0)
Platelets: 238 10*3/uL (ref 150–400)
RBC: 3.91 MIL/uL (ref 3.87–5.11)
RDW: 16.5 % — ABNORMAL HIGH (ref 11.5–15.5)
WBC: 7 10*3/uL (ref 4.0–10.5)

## 2016-09-26 LAB — BASIC METABOLIC PANEL
ANION GAP: 7 (ref 5–15)
Anion gap: 9 (ref 5–15)
BUN: 20 mg/dL (ref 6–20)
BUN: 16 mg/dL (ref 6–20)
CALCIUM: 8.3 mg/dL — AB (ref 8.9–10.3)
CHLORIDE: 113 mmol/L — AB (ref 101–111)
CO2: 23 mmol/L (ref 22–32)
CO2: 24 mmol/L (ref 22–32)
CREATININE: 0.77 mg/dL (ref 0.44–1.00)
Calcium: 7.5 mg/dL — ABNORMAL LOW (ref 8.9–10.3)
Chloride: 107 mmol/L (ref 101–111)
Creatinine, Ser: 0.69 mg/dL (ref 0.44–1.00)
GFR calc Af Amer: >60 mL/min (ref >60)
GFR calc Af Amer: >60 mL/min (ref >60)
GFR calc non Af Amer: >60 mL/min (ref >60)
GLUCOSE: 29 mg/dL — AB (ref 65–99)
GLUCOSE: 128 mg/dL — AB (ref 65–99)
POTASSIUM: 2.4 mmol/L — AB (ref 3.5–5.1)
Potassium: 3.5 mmol/L (ref 3.5–5.1)
Sodium: 143 mmol/L (ref 135–145)
Sodium: 140 mmol/L (ref 135–145)

## 2016-09-26 LAB — IRON AND TIBC
Iron: 36 ug/dL (ref 28–170)
SATURATION RATIOS: 11 % (ref 10.4–31.8)
TIBC: 340 ug/dL (ref 250–450)
UIBC: 304 ug/dL

## 2016-09-26 LAB — GLUCOSE, CAPILLARY
GLUCOSE-CAPILLARY: 101 mg/dL — AB (ref 65–99)
GLUCOSE-CAPILLARY: 110 mg/dL — AB (ref 65–99)
Glucose-Capillary: 20 mg/dL — CL (ref 65–99)
Glucose-Capillary: 70 mg/dL (ref 65–99)
Glucose-Capillary: 73 mg/dL (ref 65–99)
Glucose-Capillary: 91 mg/dL (ref 65–99)
Glucose-Capillary: 90 mg/dL (ref 65–99)
Glucose-Capillary: 238 mg/dL — ABNORMAL HIGH (ref 65–99)
Glucose-Capillary: 144 mg/dL — ABNORMAL HIGH (ref 65–99)

## 2016-09-26 LAB — VITAMIN B12: VITAMIN B 12: 2207 pg/mL — AB (ref 180–914)

## 2016-09-26 LAB — FOLATE: Folate: 45.5 ng/mL (ref >5.9)

## 2016-09-26 LAB — FERRITIN: FERRITIN: 7 ng/mL — AB (ref 11–307)

## 2016-09-26 LAB — ABO/RH: ABO/RH(D): B POS

## 2016-09-26 MED ORDER — DEXTROSE-NACL 5-0.45 % IV SOLN
INTRAVENOUS | Status: DC
  Administered 2016-09-26 (×2): via INTRAVENOUS

## 2016-09-26 MED ORDER — INSULIN ASPART 100 UNIT/ML ~~LOC~~ SOLN: 0.0000 [IU] | Freq: Every day | SUBCUTANEOUS | Status: DC

## 2016-09-26 MED ORDER — INSULIN ASPART 100 UNIT/ML ~~LOC~~ SOLN
0.0000 [IU] | Freq: Three times a day (TID) | SUBCUTANEOUS | Status: DC
Start: 2016-09-26 — End: 2016-09-28
  Administered 2016-09-26 – 2016-09-27 (×2): 5 [IU] via SUBCUTANEOUS
  Administered 2016-09-27: 3 [IU] via SUBCUTANEOUS
  Administered 2016-09-27 – 2016-09-28 (×3): 5 [IU] via SUBCUTANEOUS

## 2016-09-26 NOTE — Care Management Note (Signed)
Case Management Note  Patient Details  Name: Sheena [name] MRN: [id number] Date of Birth: February 06, 1941  Subjective/Objective:  76 y/o f admitted w/Wrist fx. From home, has custodial aide 2hrs/day. OT-recc SNF. PT cons-await recc. Ortho cons-r wrist splint.CSW already following.                  Action/Plan:d/c SNF.   Expected Discharge Date:                  Expected Discharge Plan:  Skilled Nursing Facility  In-House Referral:  Clinical Social Work  Discharge planning Services  CM Consult  Post Acute Care Choice:    Choice offered to:     DME Arranged:    DME Agency:     HH Arranged:    HH Agency:     Status of Service:  In process, will continue to follow  If discussed at Long Length of Stay Meetings, dates discussed:    Additional Comments:  Lanier Clam, RN 09/26/2016, 3:42 PM

## 2016-09-26 NOTE — Progress Notes (Signed)
Text paged MD Clyde Lundborg regarding pt;s CBGs and monitoring frequency. Initial CBG upon arrival to unit 70. Apple juice given with meds, IVF changed to D5 with NS; CBG rechecked and is 73. MD Niu notified; advised to recheck CBG at 0600. Will follow instructions and continue to monitor for changes.

## 2016-09-26 NOTE — Consult Note (Signed)
I have reviewed her xrays. I reecommend continued splint for 1wk. Re-xray at my office in 5-7days to eval for diffinitive treatment.    MURPHY, TIMOTHY D

## 2016-09-26 NOTE — Clinical Social Work Note (Addendum)
Clinical Social Work Assessment  Patient Details  Name: Sheena [name] MRN: [id number] Date of Birth: 07-20-1940  Date of referral:  09/26/16               Reason for consult:  Facility Placement                Permission sought to share information with:  Facility Industrial/product designer granted to share information::  Yes, Verbal Permission Granted  Name::        Agency::     Relationship::     Contact Information:     Housing/Transportation Living arrangements for the past 2 months:    Source of Information:  Patient Patient Interpreter Needed:  None Criminal Activity/Legal Involvement Pertinent to Current Situation/Hospitalization:    Significant Relationships:  Adult Children Lives with:  Self Do you feel safe going back to the place where you live?    Need for family participation in patient care:  Yes (Comment)  Care giving concerns:  None listed by pt/family   Social Worker assessment / plan:  CSW spoke with pt's son Onalee Hua at ph: [phone] and confirmed pt's son's plan for pt to be discharged to SNF to live at discharge.  CSW provided active listening and validated pt's son's concerns.   Pt's son gave permission for CSW Dept to complete FL-2 and send referrals out to SNF facilities via the hub per pt's request if PT recommends.  Pt's family prefers [facility] due to their living a half mile away. If nothing is available nearby pt's son said [facility] be available.  Pt has been living independently prior to being admitted to [facility].   Employment status:  Retired Health and safety inspector:  Medicare PT Recommendations:  Not assessed at this time Information / Referral to community resources:  Skilled Nursing Facility  Patient/Family's Response to care:  Patient not alert and oriented, per notes.  Patient's son agreeable to plan.  Pt's son supportive and strongly involved in pt.'s care.  Pt.'s son pleasant and appreciated CSW intervention.    Patient/Family's  Understanding of and Emotional Response to Diagnosis, Current Treatment, and Prognosis:  Still assessing  Emotional Assessment Appearance:  Other (Comment Required (Unable to assess, per note, dementia) Attitude/Demeanor/Rapport:    Affect (typically observed):  Unable to Assess Orientation:   (Dementia, per notes) Alcohol / Substance use:    Psych involvement (Current and /or in the community):     Discharge Needs  Concerns to be addressed:  No discharge needs identified Readmission within the last 30 days:  No Current discharge risk:  None Barriers to Discharge:  No Barriers Identified   Mercy Riding, LCSWA 09/26/2016, 10:50 PM

## 2016-09-26 NOTE — Progress Notes (Signed)
PROGRESS NOTE [facility]   Sheena [name]   ZOX:[id number] DOB: January 16, 1941  DOA: 09/25/2016 PCP: Georgann Housekeeper, MD   Brief Narrative:  76 year old female with medical history significant for severe dementia, hypertension, hyperlipidemia, diabetes mellitus is brought to the emergency department after she was found in the floor confuse and complaining of abdominal pain, nausea and vomiting. Patient was found to have right wrist fracture electrolyte imbalance and hypoglycemia with blood sugar of 30. Patient was admitted for further management  Subjective: Patient seen and examined, she had difficulty with hearing she wears a cochlear implant, poorly responsive this morning but alert and awake. Per son he thinks that this might be her a baseline mentally.  Assessment & Plan: Right wrist fracture - nondisplaced Orthopedic surgery consulted - recommending to keep soft cast in place, repeat x-ray in 5-7 day and they will evaluate as an outpatient. Continue pain management as needed PT/OT recommending sneakers  Hypoglycemia - likely due to continuation of insulin and poor appetite.  Likely the cause of the fall Continue to monitor Resume regular diet DC IV fluids  Diabetes mellitus type 2 - was hypoglycemic We will add sliding scale  Altered mental status /Severe dementia - likely multifactorial from fall, not to deconditioning of dementia and poor oral intake, close to baseline per family members CT head negative for acute abnormalities We'll continue to monitor  Normocytic anemia initial hemoglobin 8.5 - ? Lab error Repeat hemoglobin 10, FOBT negative Anemia panel normal Continue to monitor  Abdominal pain - unclear source, may be due to trauma from fall CT abdomen normal We'll monitor for now  Hypokalemia and hypomagnesemia - Likely secondary to vomiting and poor oral intake Replete Check levels in the morning  DVT prophylaxis: SCD's Code Status: DNR Family  Communication: Case discussed with son via phone call Disposition Plan: SNF when medically stable   Consultants:   Ortho   Procedures:  None   Antimicrobials: Anti-infectives    None        Objective: Vitals:   09/26/16 0120 09/26/16 0657 09/26/16 0942 09/26/16 1452  BP: (!) 138/94 (!) 151/68  94/77  Pulse: 81 89  90  Resp: Temp: 97.2 F (36.2 C) 97.9 F (36.6 C)  98.2 F (36.8 C)  TempSrc: Oral Oral  Oral  SpO2: 100% 98%  95%  Weight: 60.3 kg (132 lb 15 oz)  61.9 kg (136 lb 7.4 oz)   Height:    (1.626 m)     Intake/Output Summary (Last 24 hours) at 09/26/16 1858 Last data filed at 09/26/16 1445  Gross per 24 hour  Intake          1899.25 ml  Output                0 ml  Net          1899.25 ml   Filed Weights   09/26/16 0120 09/26/16 0942  Weight: 60.3 kg (132 lb 15 oz) 61.9 kg (136 lb 7.4 oz)    Examination:  General exam: Sleepy, NAD  Respiratory system: Clear to auscultation.  Cardiovascular system: S1 & S2 heard, RRR. No JVD, murmurs, rubs or gallops Gastrointestinal system: Abdomen is nondistended, soft and nontender. Central nervous system: Confuse, poorly responsive  Extremities: Soft cast on R arm. Finger swollen, good ROM  Skin: No rashes Psychiatry: Dementia    Data Reviewed: I have personally reviewed following labs and imaging studies  CBC:  Recent Labs Lab  09/25/16 2107 09/25/16 2116 09/26/16 0545  WBC 7.5  --  7.0  NEUTROABS 5.6  --   --   HGB 8.7* 8.5* 10.0*  HCT 27.6* 25.0* 32.2*  MCV 81.9  --  82.4  PLT 203  --  238   Basic Metabolic Panel:  Recent Labs Lab 09/25/16 1527 09/25/16 2106 09/25/16 2116 09/26/16 0545  NA 140 143 146* 140  K 3.7 2.4* 2.4* 3.5  CL 103 113* 110 107  CO2 25 23  --  24  GLUCOSE 102* 29* 30* 128*  BUN 23* CREATININE 0.93 0.69 0.70 0.77  CALCIUM 9.3 7.5*  --  8.3*  MG  --  0.9*  --  2.1   GFR: Estimated Creatinine Clearance: 51.7 mL/min (by C-G formula based on  SCr of 0.77 mg/dL). Liver Function Tests: No results for input(s): AST, ALT, ALKPHOS, BILITOT, PROT, ALBUMIN in the last 168 hours.  Recent Labs Lab 09/25/16 2158  LIPASE 18   No results for input(s): AMMONIA in the last 168 hours. Coagulation Profile:  Recent Labs Lab 09/25/16 2158  INR 1.13   Cardiac Enzymes:  Recent Labs Lab 09/25/16 1527  CKTOTAL 199   BNP (last 3 results) No results for input(s): PROBNP in the last 8760 hours. HbA1C: No results for input(s): HGBA1C in the last 72 hours. CBG:  Recent Labs Lab 09/26/16 0653 09/26/16 0746 09/26/16 0936 09/26/16 1152 09/26/16 1624  GLUCAP 91 90 101* 110* 238*   Lipid Profile:  Recent Labs  09/26/16 0545  CHOL 200  HDL 42  LDLCALC 121*  TRIG 187*  CHOLHDL 4.8   Thyroid Function Tests: No results for input(s): TSH, T4TOTAL, FREET4, T3FREE, THYROIDAB in the last 72 hours. Anemia Panel:  Recent Labs  09/25/16 2255  VITAMINB12 2,207*  FOLATE 45.5  FERRITIN 7*  TIBC 340  IRON 36  RETICCTPCT 1.5   Sepsis Labs: No results for input(s): PROCALCITON, LATICACIDVEN in the last 168 hours.  No results found for this or any previous visit (from the past 240 hour(s)).       Radiology Studies: Dg Lumbar Spine Complete  Result Date: 09/25/2016 CLINICAL DATA:  Status post unwitnessed fall at home. EXAM: LUMBAR SPINE - COMPLETE 4+ VIEW COMPARISON:  Lumbar spine series of November 28, 2011 FINDINGS: The patient has undergone previous kyphoplasty at L1 and L3. Partial compressions at these levels are stable. There is partial compression of L4 which is not being treated. This is also stable. L2 and L5 exhibit normal vertebral body height. There is no spondylolisthesis. The pedicles of L2 and L4 and L5 are grossly normal where visualized. The observed portions of the sacrum exhibit no acute abnormalities. There is gentle curvature of the lumbar spine convex toward the right. IMPRESSION: Chronic changes within the  lumbar spine. No acute fracture or dislocation. Electronically Signed   By: David  [place] M.D.   On: 09/25/2016 15:18   Dg Pelvis 1-2 Views  Result Date: 09/25/2016 CLINICAL DATA:  Status post fall. Patient reports low back and tail bone pain EXAM: PELVIS - 1-2 VIEW COMPARISON:  Limited views of the pelvis for all lumbar spine series of March 11, 2011. FINDINGS: The bony pelvis is subjectively adequately mineralized the iliac bones appear intact. The observed portions of the sacrum and SI joints exhibit no acute abnormalities. There is a stable sclerotic focus in inferior aspect of the left iliac bone. Old fractures of the pubic rami bilaterally are visible. No acute pubic  ramus fracture is observed. There is mild narrowing of the right hip joint space medially. The left hip joint space is reasonably well maintained. The femoral heads, necks, intertrochanteric, and immediate sub trochanteric regions appear normal. IMPRESSION: Chronic deformity of the pubic rami consistent with previous fractures. No acute pelvic fracture is observed. The observed portions of the hips reveal no acute abnormalities. Electronically Signed   By: David  [place] M.D.   On: 09/25/2016 15:16   Dg Shoulder Right  Result Date: 09/25/2016 CLINICAL DATA:  Status post fall.  Right shoulder pain. EXAM: RIGHT SHOULDER - 2+ VIEW COMPARISON:  None. FINDINGS: There is no evidence of fracture or dislocation. There is generalized osteopenia. The Soft tissues are unremarkable. IMPRESSION: No acute osseous injury of the right shoulder. Electronically Signed   By: Elige Ko   On: 09/25/2016 15:15   Dg Wrist Complete Right  Result Date: 09/25/2016 CLINICAL DATA:  Status post fall.  Erythematous swollen wrist. EXAM: RIGHT WRIST - COMPLETE 3+ VIEW COMPARISON:  None in PACs FINDINGS: The bones are subjectively osteopenic. There is a impacted nondisplaced fracture of the distal radial metaphysis. There is an ulnar styloid fracture. The carpal  bones are grossly intact. There is mild narrowing of the radiocarpal joint. There is mild degenerative change of the first [facility] joint. There is calcification of the triangular fibrocartilage. The metacarpals appear intact. IMPRESSION: Nondisplaced impacted fracture of the distal right radial metaphysis. Minimally distracted ulnar styloid fracture. Electronically Signed   By: David  [place] M.D.   On: 09/25/2016 15:11   Ct Head Wo Contrast  Result Date: 09/25/2016 CLINICAL DATA:  Recent fall EXAM: CT HEAD WITHOUT CONTRAST CT CERVICAL SPINE WITHOUT CONTRAST TECHNIQUE: Multidetector CT imaging of the head and cervical spine was performed following the standard protocol without intravenous contrast. Multiplanar CT image reconstructions of the cervical spine were also generated. COMPARISON:  12/16/2012 FINDINGS: CT HEAD FINDINGS Brain: Mild atrophic changes and chronic white matter ischemic change is seen. No findings to suggest acute hemorrhage, acute infarction or space-occupying mass lesion are noted. Vascular: No hyperdense vessel or unexpected calcification. Skull: Postsurgical changes are noted in the mastoid air cells on the right. A cochlear implantation device is seen on the right. No acute fracture is noted. Sinuses/Orbits: No acute finding. Other: None. CT CERVICAL SPINE FINDINGS Alignment: Within normal limits Skull base and vertebrae: 7 cervical vertebra are well visualized. Vertebral body height is well maintained. Osteophytic changes are noted at multiple levels as well as in the upper thoracic levels. Mild facet hypertrophic changes are seen. No findings to suggest acute fracture or acute facet abnormality are noted. Soft tissues and spinal canal: Heavy carotid calcifications are noted bilaterally with changes suggestive of high-grade stenosis. The need for further evaluation can be determined on a clinical basis. Disc levels:  Disc space narrowing is noted at C5-6. Upper chest: Within normal limits.  IMPRESSION: CT of the head: Chronic changes without acute abnormality. CT of the cervical spine: Degenerative change without acute abnormality. Electronically Signed   By: Alcide Clever M.D.   On: 09/25/2016 14:51   Ct Cervical Spine Wo Contrast  Result Date: 09/25/2016 CLINICAL DATA:  Recent fall EXAM: CT HEAD WITHOUT CONTRAST CT CERVICAL SPINE WITHOUT CONTRAST TECHNIQUE: Multidetector CT imaging of the head and cervical spine was performed following the standard protocol without intravenous contrast. Multiplanar CT image reconstructions of the cervical spine were also generated. COMPARISON:  12/16/2012 FINDINGS: CT HEAD FINDINGS Brain: Mild atrophic changes and chronic white matter  ischemic change is seen. No findings to suggest acute hemorrhage, acute infarction or space-occupying mass lesion are noted. Vascular: No hyperdense vessel or unexpected calcification. Skull: Postsurgical changes are noted in the mastoid air cells on the right. A cochlear implantation device is seen on the right. No acute fracture is noted. Sinuses/Orbits: No acute finding. Other: None. CT CERVICAL SPINE FINDINGS Alignment: Within normal limits Skull base and vertebrae: 7 cervical vertebra are well visualized. Vertebral body height is well maintained. Osteophytic changes are noted at multiple levels as well as in the upper thoracic levels. Mild facet hypertrophic changes are seen. No findings to suggest acute fracture or acute facet abnormality are noted. Soft tissues and spinal canal: Heavy carotid calcifications are noted bilaterally with changes suggestive of high-grade stenosis. The need for further evaluation can be determined on a clinical basis. Disc levels:  Disc space narrowing is noted at C5-6. Upper chest: Within normal limits. IMPRESSION: CT of the head: Chronic changes without acute abnormality. CT of the cervical spine: Degenerative change without acute abnormality. Electronically Signed   By: Alcide Clever M.D.   On:  09/25/2016 14:51   Ct Abdomen Pelvis W Contrast  Result Date: 09/26/2016 CLINICAL DATA:  Status post fall, with lower back pain. Concern for abdominal injury. Initial encounter. EXAM: CT ABDOMEN AND PELVIS WITH CONTRAST TECHNIQUE: Multidetector CT imaging of the abdomen and pelvis was performed using the standard protocol following bolus administration of intravenous contrast. CONTRAST:  ISOVUE-300 IOPAMIDOL (ISOVUE-300) INJECTION 61% COMPARISON:  Left hip CT performed 12/2002, and abdominal ultrasound performed 11/21/2010 FINDINGS: Lower chest: Mild bibasilar atelectasis is noted. Scattered coronary artery calcifications are seen. Hepatobiliary: The liver is unremarkable in appearance. The gallbladder is unremarkable in appearance. The common bile duct remains normal in caliber. Pancreas: The pancreas is within normal limits. Spleen: The spleen is unremarkable in appearance. Adrenals/Urinary Tract: The adrenal glands are unremarkable in appearance. The kidneys are within normal limits. There is no evidence of hydronephrosis. No renal or ureteral stones are identified. No perinephric stranding is seen. Stomach/Bowel: The stomach is unremarkable in appearance. The small bowel is within normal limits. The appendix is not visualized; there is no evidence for appendicitis. The colon is unremarkable in appearance. Vascular/Lymphatic: Diffuse calcification is seen along the abdominal aorta and its branches. There is mild luminal narrowing at the distal abdominal aorta. The inferior vena cava is grossly unremarkable. No retroperitoneal lymphadenopathy is seen. No pelvic sidewall lymphadenopathy is identified. Reproductive: The bladder is mildly distended and grossly unremarkable. The patient is status post hysterectomy. The ovaries are relatively symmetric. No suspicious adnexal masses are seen. Other: No additional soft tissue abnormalities are seen. Musculoskeletal: No acute osseous abnormalities are  identified. The patient is status post vertebroplasty at L1 and L3, and chronic compression deformity is also noted at L4. Chronic bilateral superior and inferior rami deformities are noted. The visualized musculature is unremarkable in appearance. IMPRESSION: 1. No evidence of traumatic injury to the abdomen or pelvis. 2. Chronic compression deformity at L4. 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 3. Diffuse aortic atherosclerosis. Mild luminal narrowing at the distal abdominal aorta. 4. Mild bibasilar atelectasis. 5. Scattered coronary artery calcifications seen. Electronically Signed   By: Roanna Raider M.D.   On: 09/26/2016 00:54    Scheduled Meds: . aspirin EC  81 mg Oral Daily  . calcium-vitamin D  1 tablet Oral Daily  . cholecalciferol  1,000 Units Oral Daily  . divalproex  125 mg Oral QHS  .  donepezil  10 mg Oral QHS  . insulin aspart  0-15 Units Subcutaneous TID WC  . insulin aspart  0-5 Units Subcutaneous QHS  . levothyroxine  88 mcg Oral QAC breakfast  . mirtazapine  7.5 mg Oral QHS  . multivitamin with minerals  1 tablet Oral Daily  . omega-3 acid ethyl esters  1 g Oral Daily  . pravastatin  20 mg Oral q1800  . QUEtiapine  50 mg Oral QHS  . sodium chloride flush  3 mL Intravenous Q12H  . vitamin B-12  1,000 mcg Oral Daily  . vitamin C  500 mg Oral Daily   Continuous Infusions:   LOS: 1 day    Latrelle Dodrill, MD Pager: Text Page via [email or web address]  [phone]  If 7PM-7AM, please contact night-coverage [email or web address] Password [facility] 09/26/2016, 6:58 PM

## 2016-09-26 NOTE — Evaluation (Signed)
Occupational Therapy Evaluation Patient Details Name: Sheena [name] MRN: [id number] DOB: March 06, 1941 Today's Date: 09/26/2016    History of Present Illness Pt was admitted after a fall.  She sustained a R impacted nondisplaced radial metaphysis fx and R minimally distracted ulnar styloid fx.  PMH:  dementia, HTN, DDM,depression and decreased hearing   Clinical Impression   This 76 year old female was admitted for the above.  Pt had limited support at home and participated in ADLs.  She currently needs mostly max to total A for adls, and she only partially stood at time of evaluation. She will need SNF due to limited caregiver support    Follow Up Recommendations  SNF    Equipment Recommendations   (likely none)    Recommendations for Other Services       Precautions / Restrictions Precautions Precautions: Fall Restrictions Weight Bearing Restrictions: Yes Other Position/Activity Restrictions: NWB   Pt in a sling     Mobility Bed Mobility Overal bed mobility: Needs Assistance Bed Mobility: Rolling;Supine to Sit Rolling: Mod assist   Supine to sit: Max assist     General bed mobility comments: assist for trunk and legs to roll and sit up.  +2 assistance to change chux pad under her.    Transfers Overall transfer level: Needs assistance   Transfers: Sit to/from Stand           General transfer comment: semi stood with mod A    Balance                                           ADL either performed or assessed with clinical judgement   ADL Overall ADL's : Needs assistance/impaired Eating/Feeding: Maximal assistance;Bed level Eating/Feeding Details (indicate cue type and reason): pt is right dominant Grooming: Moderate assistance;Bed level   Upper Body Bathing: Bed level;Maximal assistance   Lower Body Bathing: Maximal assistance;+2 for physical assistance;Sit to/from stand Lower Body Bathing Details (indicate cue type and reason): vs  rolling  and bridging Upper Body Dressing : Maximal assistance;Sitting   Lower Body Dressing: Total assistance;+2 for physical assistance;Sit to/from stand Lower Body Dressing Details (indicate cue type and reason): vs bridging and rolling               General ADL Comments: pt attempted to stand without AD; therapist in front of her. She partially stood with mod A, holding on to mattress (and didn't let go).  Rolled and bridged to change wet chux under her. daughter in law present     Vision         Perception     Praxis      Pertinent Vitals/Pain Pain Assessment: Faces Faces Pain Scale: Hurts even more Pain Location: R arm Pain Intervention(s): Limited activity within patient's tolerance;Monitored during session;Premedicated before session;Repositioned     Hand Dominance     Extremity/Trunk Assessment Upper Extremity Assessment Upper Extremity Assessment: RUE deficits/detail;Difficult to assess due to impaired cognition RUE Deficits / Details: immobilized. Able to move fingers a little bit.  Encouraged this for edema management and placed small roll with washcloths under hand to elevate a little.  Pt wearing sling           Communication Communication Communication: HOH (very little verbalization)   Cognition Arousal/Alertness: Awake/alert Behavior During Therapy: WFL for tasks assessed/performed Overall Cognitive Status: History of cognitive impairments - at  baseline                                 General Comments: delayed processing; said "I can't do that" before trying. Slow processing, multimodal cues given   General Comments       Exercises     Shoulder Instructions      Home Living Family/patient expects to be discharged to:: Skilled nursing facility Living Arrangements: Alone                               Additional Comments: pt had aide 2 hours a day for IADLs.  Daughter in law and son checked on her a couple of  times a day and daughter in law assisted with showering      Prior Functioning/Environment Level of Independence: Needs assistance    ADL's / Homemaking Assistance Needed: assist for showering.  Assist for IADLs.  She used toilet herself            OT Problem List: Decreased strength;Impaired balance (sitting and/or standing);Decreased activity tolerance;Decreased coordination;Decreased cognition;Pain;Impaired UE functional use      OT Treatment/Interventions: Self-care/ADL training;Therapeutic exercise;DME and/or AE instruction;Therapeutic activities;Patient/family education;Balance training;Cognitive remediation/compensation    OT Goals(Current goals can be found in the care plan section) Acute Rehab OT Goals Patient Stated Goal: family's goal:  rehab OT Goal Formulation: With family Time For Goal Achievement: 10/03/16 Potential to Achieve Goals: Fair ADL Goals Pt Will Perform Eating: with min assist;bed level;sitting (using LUE) Pt Will Perform Grooming: with min assist;sitting;bed level Pt Will Perform Upper Body Bathing: with mod assist;sitting;bed level Pt Will Transfer to Toilet: with mod assist;stand pivot transfer;bedside commode Additional ADL Goal #1: pt will go from sit to stand with mod A for adls and maintain balance with min A for 2 minutes (using platform walker, if MD allows) Additional ADL Goal #2: pt will perform bed mobility at mod A level in preparation for adls  OT Frequency: Min 2X/week   Barriers to D/C:            Co-evaluation              End of Session    Activity Tolerance: Patient limited by fatigue;Patient limited by pain Patient left: in bed;with call bell/phone within reach;with bed alarm set;with family/visitor present  OT Visit Diagnosis: Unsteadiness on feet (R26.81);Pain Pain - Right/Left: Right Pain - part of body: Hand                Time: 0981-1914 OT Time Calculation (min): 22 min Charges:  OT General Charges $OT Visit:  1 Procedure OT Evaluation $OT Eval Moderate Complexity: 1 Procedure G-Codes:     [place], OTR/L [phone] 09/26/2016  Sheena [name] 09/26/2016, 3:14 PM

## 2016-09-27 DIAGNOSIS — S62101D Fracture of unspecified carpal bone, right wrist, subsequent encounter for fracture with routine healing: Secondary | ICD-10-CM

## 2016-09-27 LAB — BASIC METABOLIC PANEL
Anion gap: 8 (ref 5–15)
BUN: 13 mg/dL (ref 6–20)
CO2: 26 mmol/L (ref 22–32)
Calcium: 8.3 mg/dL — ABNORMAL LOW (ref 8.9–10.3)
Chloride: 101 mmol/L (ref 101–111)
Creatinine, Ser: 1.09 mg/dL — ABNORMAL HIGH (ref 0.44–1.00)
GFR calc Af Amer: 56 mL/min — ABNORMAL LOW (ref >60)
GFR, EST NON AFRICAN AMERICAN: 48 mL/min — AB (ref >60)
GLUCOSE: 235 mg/dL — AB (ref 65–99)
POTASSIUM: 3.5 mmol/L (ref 3.5–5.1)
Sodium: 135 mmol/L (ref 135–145)

## 2016-09-27 LAB — URINE CULTURE

## 2016-09-27 LAB — CBC WITH DIFFERENTIAL/PLATELET
Basophils Absolute: 0 10*3/uL (ref 0.0–0.1)
Basophils Relative: 0 %
Eosinophils Absolute: 0.1 10*3/uL (ref 0.0–0.7)
Eosinophils Relative: 2 %
HEMATOCRIT: 30.2 % — AB (ref 36.0–46.0)
Hemoglobin: 9.5 g/dL — ABNORMAL LOW (ref 12.0–15.0)
LYMPHS PCT: 24 %
Lymphs Abs: 1.6 10*3/uL (ref 0.7–4.0)
MCH: 25.6 pg — ABNORMAL LOW (ref 26.0–34.0)
MCHC: 31.5 g/dL (ref 30.0–36.0)
MCV: 81.4 fL (ref 78.0–100.0)
MONO ABS: 0.8 10*3/uL (ref 0.1–1.0)
MONOS PCT: 12 %
NEUTROS ABS: 4.4 10*3/uL (ref 1.7–7.7)
Neutrophils Relative %: 62 %
Platelets: 208 10*3/uL (ref 150–400)
RBC: 3.71 MIL/uL — ABNORMAL LOW (ref 3.87–5.11)
RDW: 16.3 % — AB (ref 11.5–15.5)
WBC: 7 10*3/uL (ref 4.0–10.5)

## 2016-09-27 LAB — GLUCOSE, CAPILLARY
GLUCOSE-CAPILLARY: 207 mg/dL — AB (ref 65–99)
GLUCOSE-CAPILLARY: 236 mg/dL — AB (ref 65–99)
Glucose-Capillary: 152 mg/dL — ABNORMAL HIGH (ref 65–99)
Glucose-Capillary: 190 mg/dL — ABNORMAL HIGH (ref 65–99)

## 2016-09-27 LAB — PHOSPHORUS: Phosphorus: 3.3 mg/dL (ref 2.5–4.6)

## 2016-09-27 LAB — HEMOGLOBIN A1C
HEMOGLOBIN A1C: 6.8 % — AB (ref 4.8–5.6)
Mean Plasma Glucose: 148 mg/dL

## 2016-09-27 LAB — MAGNESIUM
Magnesium: 1.2 mg/dL — ABNORMAL LOW (ref 1.7–2.4)
Magnesium: 1.3 mg/dL — ABNORMAL LOW (ref 1.7–2.4)

## 2016-09-27 MED ORDER — MAGNESIUM SULFATE 2 GM/50ML IV SOLN
2.0000 g | Freq: Once | INTRAVENOUS | Status: AC
  Administered 2016-09-27: 2 g via INTRAVENOUS
  Filled 2016-09-27: qty 50

## 2016-09-27 MED ORDER — SODIUM CHLORIDE 0.9 % IV BOLUS (SEPSIS)
1000.0000 mL | Freq: Once | INTRAVENOUS | Status: AC
  Administered 2016-09-27: 1000 mL via INTRAVENOUS

## 2016-09-27 NOTE — Evaluation (Signed)
Physical Therapy Evaluation Patient Details Name: Sheena [name] MRN: [id number] DOB: 1941/01/11 Today's Date: 09/27/2016   History of Present Illness  76 yo female admitted after having a fall at home. Found to have a R distal radial fx and ulnar styloid fx, R shoulder hematoma. Hx of dementia, HTN, DM, cochlear implants.   Clinical Impression  On eval, pt required Mod-Max assist +2 for mobility. Pt stood x 2 for ~5-6 seconds each attempt. Pt c/o some pain that did not seem to originate from R UE however pt could not tell therapist where she was hurting. She was somewhat lethargic and had difficulty attending to task during session. Son present during session-discussed d/c plan. Recommend SNF for continued rehab.     Follow Up Recommendations SNF    Equipment Recommendations  None recommended by PT    Recommendations for Other Services       Precautions / Restrictions Precautions Precautions: Fall Required Braces or Orthoses: Sling (R UE) Restrictions Weight Bearing Restrictions: Yes RUE Weight Bearing: Non weight bearing      Mobility  Bed Mobility Overal bed mobility: Needs Assistance Bed Mobility: Supine to Sit;Sit to Supine     Supine to sit: Mod assist;+2 for physical assistance;+2 for safety/equipment;HOB elevated Sit to supine: Max assist;+2 for physical assistance;+2 for safety/equipment;HOB elevated   General bed mobility comments: Assist for trunk and bil LEs. Max multimodal cues required. Utilized bedpad for scooting, positioning.   Transfers Overall transfer level: Needs assistance   Transfers: Sit to/from Stand Sit to Stand: Mod assist;+2 physical assistance;+2 safety/equipment;From elevated surface         General transfer comment: x2. Pt stood for ~5-6 seconds each time. Assist to rise, stabilize, control descent. On 2nd attempt, pt maintained a flexed trunk posture and c/o pain- ? back pain?  Ambulation/Gait             General Gait Details:  NT-pt unable   Stairs            Wheelchair Mobility    Modified Rankin (Stroke Patients Only)       Balance Overall balance assessment: Needs assistance;History of Falls           Standing balance-Leahy Scale: Zero                               Pertinent Vitals/Pain Pain Assessment: Faces Faces Pain Scale: Hurts even more Pain Location: unsure of location-pt would not state when asked by therapist or her son. Seems to have been another site in addition to R UE. Pain Descriptors / Indicators: Grimacing;Moaning Pain Intervention(s): Limited activity within patient's tolerance;Repositioned    Home Living Family/patient expects to be discharged to:: Skilled nursing facility               Home Equipment: Walker - 4 wheels      Prior Function Level of Independence: Needs assistance   Gait / Transfers Assistance Needed: ambulatory with a walker           Hand Dominance        Extremity/Trunk Assessment   Upper Extremity Assessment Upper Extremity Assessment: Defer to OT evaluation RUE Deficits / Details: R UE in sling. Edema noted in hand.    Lower Extremity Assessment Lower Extremity Assessment: Generalized weakness    Cervical / Trunk Assessment Cervical / Trunk Assessment: Kyphotic  Communication   Communication: HOH  Cognition Arousal/Alertness: Lethargic Behavior During Therapy:  Flat affect Overall Cognitive Status: History of cognitive impairments - at baseline                                        General Comments      Exercises     Assessment/Plan    PT Assessment Patient needs continued PT services  PT Problem List Decreased strength;Decreased mobility;Decreased activity tolerance;Decreased balance;Pain;Decreased cognition;Decreased knowledge of use of DME;Decreased safety awareness;Decreased knowledge of precautions       PT Treatment Interventions DME instruction;Therapeutic  activities;Therapeutic exercise;Gait training;Balance training;Functional mobility training;Patient/family education    PT Goals (Current goals can be found in the Care Plan section)  Acute Rehab PT Goals Patient Stated Goal: family's goal:  rehab PT Goal Formulation: With family Time For Goal Achievement: 10/11/16 Potential to Achieve Goals: Fair    Frequency Min 3X/week   Barriers to discharge        Co-evaluation               End of Session Equipment Utilized During Treatment: Gait belt Activity Tolerance: Patient limited by fatigue;Patient limited by pain;Patient limited by lethargy (Limited by cognition) Patient left: in bed;with call bell/phone within reach;with family/visitor present;with bed alarm set;with nursing/sitter in room Nurse Communication:  (bed linens needed to be changed) PT Visit Diagnosis: Muscle weakness (generalized) (M62.81);Difficulty in walking, not elsewhere classified (R26.2)    Time: 1610-9604 PT Time Calculation (min) (ACUTE ONLY): 21 min   Charges:   PT Evaluation $PT Eval Moderate Complexity: 1 Procedure     PT G Codes:          Rebeca Alert, MPT Pager: [phone]

## 2016-09-27 NOTE — Progress Notes (Signed)
PROGRESS NOTE [facility]   LALONNIE SHAFFER   ZOX:[id number] DOB: 1941-06-11  DOA: 09/25/2016 PCP: Georgann Housekeeper, MD   Brief Narrative:  76 year old female with medical history significant for severe dementia, hypertension, hyperlipidemia, diabetes mellitus is brought to the emergency department after she was found in the floor confuse and complaining of abdominal pain, nausea and vomiting. Patient was found to have right wrist fracture electrolyte imbalance and hypoglycemia with blood sugar of 30. Patient was admitted for further management.   Subjective: Patient seen and examined, patient is very hard hearing and have does not have her cochlear implant today. Severe dementia and unable to communicate well. Patient is awake and interactive.  Per son patient tolerating diet well. He report that her mental status about to baseline.   Assessment & Plan: Right wrist fracture - nondisplaced Orthopedic surgery consulted - recommending to keep soft cast in place, repeat x-ray in 5-7 day and they will evaluate as an outpatient. Continue pain management as needed PT/OT recommending SNF   Hypoglycemia - likely due to continuation of insulin and poor appetite.  Likely the cause of the fall Continue to monitor Resume regular diet DC IV fluids  AKI - likely due to contrast and poor oral intake  Will give IVF bolus  Avoid nephrotoxin  Check BMP in AM   Diabetes mellitus type 2 - was hypoglycemic Continue SSI  D/c Lantus, patient's A1C 6.8, with her condition and her poor oral intake, basal insulin could be harmful.   Altered mental status /Severe dementia - likely multifactorial from fall, natural deconditioning of dementia and poor oral intake, close to baseline per family members CT head negative for acute abnormalities Patient not really verbal, ? If due to hearing vs poor comprehension.   Normocytic anemia initial hemoglobin 8.5 - ? Lab error Repeat hemoglobin 10, FOBT negative,  slight drop on Hgb likely dilutional  Anemia panel normal Continue to monitor  Abdominal pain - unclear source, may be due to trauma from fall CT abdomen normal We'll monitor for now  Hypokalemia and hypomagnesemia - Likely secondary to vomiting and poor oral intake Hypokalemia resolved  Hypomagnesemia - replete today  Check levels in the morning  DVT prophylaxis: SCD's Code Status: DNR Family Communication: Case discussed with son via phone call Disposition Plan: SNF possible in 24 hrs   Consultants:   Ortho   Procedures:  None   Antimicrobials: Anti-infectives    None       Objective: Vitals:   09/26/16 1452 09/26/16 2121 09/27/16 0422 09/27/16 1521  BP: 94/77 (!) 162/64 (!) 154/71 (!) 186/75  Pulse: 90 97 (!) 114 97  Resp: Temp: 98.2 F (36.8 C) 99.2 F (37.3 C) 98.7 F (37.1 C) 98.6 F (37 C)  TempSrc: Oral Oral Oral Oral  SpO2: 95% 94% 94% 93%  Weight:      Height:        Intake/Output Summary (Last 24 hours) at 09/27/16 1522 Last data filed at 09/27/16 1258  Gross per 24 hour  Intake               70 ml  Output                0 ml  Net               70 ml   Filed Weights   09/26/16 0120 09/26/16 0942  Weight: 60.3 kg (132 lb 15 oz) 61.9 kg (136 lb 7.4  oz)    Examination:  General exam: NAD, Awake  Respiratory system: CTA  Cardiovascular system: S1S2 RRR Gastrointestinal system: Soft NTND  Central nervous system: Non verbal  Extremities: Soft splint on R arm, finger continue to be swollen, L arm slight edematous  Skin: No lesions  Data Reviewed: I have personally reviewed following labs and imaging studies  CBC:  Recent Labs Lab 09/25/16 2107 09/25/16 2116 09/26/16 0545 09/27/16 0538  WBC 7.5  --  7.0 7.0  NEUTROABS 5.6  --   --  4.4  HGB 8.7* 8.5* 10.0* 9.5*  HCT 27.6* 25.0* 32.2* 30.2*  MCV 81.9  --  82.4 81.4  PLT 203  --  238 208   Basic Metabolic Panel:  Recent Labs Lab 09/25/16 1527 09/25/16 2106  09/25/16 2116 09/26/16 0545 09/27/16 0538  NA 140 143 146* 140 135  K 3.7 2.4* 2.4* 3.5 3.5  CL 103 113* 110 107 101  CO2 25 23  --  24 26  GLUCOSE 102* 29* 30* 128* 235*  BUN 23* CREATININE 0.93 0.69 0.70 0.77 1.09*  CALCIUM 9.3 7.5*  --  8.3* 8.3*  MG  --  0.9*  --  2.1 1.3*  1.2*  PHOS  --   --   --   --  3.3   GFR: Estimated Creatinine Clearance: 37.9 mL/min (A) (by C-G formula based on SCr of 1.09 mg/dL (H)). Liver Function Tests: No results for input(s): AST, ALT, ALKPHOS, BILITOT, PROT, ALBUMIN in the last 168 hours.  Recent Labs Lab 09/25/16 2158  LIPASE 18   No results for input(s): AMMONIA in the last 168 hours. Coagulation Profile:  Recent Labs Lab 09/25/16 2158  INR 1.13   Cardiac Enzymes:  Recent Labs Lab 09/25/16 1527  CKTOTAL 199   BNP (last 3 results) No results for input(s): PROBNP in the last 8760 hours. HbA1C:  Recent Labs  09/26/16 0545  HGBA1C 6.8*   CBG:  Recent Labs Lab 09/26/16 1152 09/26/16 1624 09/26/16 2124 09/27/16 0744 09/27/16 1203  GLUCAP 110* 238* 144* 207* 152*   Lipid Profile:  Recent Labs  09/26/16 0545  CHOL 200  HDL 42  LDLCALC 121*  TRIG 187*  CHOLHDL 4.8   Anemia Panel:  Recent Labs  09/25/16 2255  VITAMINB12 2,207*  FOLATE 45.5  FERRITIN 7*  TIBC 340  IRON 36  RETICCTPCT 1.5     Recent Results (from the past 240 hour(s))  Urine culture     Status: Abnormal   Collection Time: 09/25/16 10:40 PM  Result Value Ref Range Status   Specimen Description URINE, CLEAN CATCH  Final   Special Requests NONE  Final   Culture MULTIPLE SPECIES PRESENT, SUGGEST RECOLLECTION (A)  Final   Report Status 09/27/2016 FINAL  Final     Radiology Studies: Ct Abdomen Pelvis W Contrast  Result Date: 09/26/2016 CLINICAL DATA:  Status post fall, with lower back pain. Concern for abdominal injury. Initial encounter. EXAM: CT ABDOMEN AND PELVIS WITH CONTRAST TECHNIQUE: Multidetector CT imaging of  the abdomen and pelvis was performed using the standard protocol following bolus administration of intravenous contrast. CONTRAST:  ISOVUE-300 IOPAMIDOL (ISOVUE-300) INJECTION 61% COMPARISON:  Left hip CT performed 12/2002, and abdominal ultrasound performed 11/21/2010 FINDINGS: Lower chest: Mild bibasilar atelectasis is noted. Scattered coronary artery calcifications are seen. Hepatobiliary: The liver is unremarkable in appearance. The gallbladder is unremarkable in appearance. The common bile duct remains normal in caliber. Pancreas: The pancreas  is within normal limits. Spleen: The spleen is unremarkable in appearance. Adrenals/Urinary Tract: The adrenal glands are unremarkable in appearance. The kidneys are within normal limits. There is no evidence of hydronephrosis. No renal or ureteral stones are identified. No perinephric stranding is seen. Stomach/Bowel: The stomach is unremarkable in appearance. The small bowel is within normal limits. The appendix is not visualized; there is no evidence for appendicitis. The colon is unremarkable in appearance. Vascular/Lymphatic: Diffuse calcification is seen along the abdominal aorta and its branches. There is mild luminal narrowing at the distal abdominal aorta. The inferior vena cava is grossly unremarkable. No retroperitoneal lymphadenopathy is seen. No pelvic sidewall lymphadenopathy is identified. Reproductive: The bladder is mildly distended and grossly unremarkable. The patient is status post hysterectomy. The ovaries are relatively symmetric. No suspicious adnexal masses are seen. Other: No additional soft tissue abnormalities are seen. Musculoskeletal: No acute osseous abnormalities are identified. The patient is status post vertebroplasty at L1 and L3, and chronic compression deformity is also noted at L4. Chronic bilateral superior and inferior rami deformities are noted. The visualized musculature is unremarkable in appearance. IMPRESSION: 1. No  evidence of traumatic injury to the abdomen or pelvis. 2. Chronic compression deformity at L4. 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 3. Diffuse aortic atherosclerosis. Mild luminal narrowing at the distal abdominal aorta. 4. Mild bibasilar atelectasis. 5. Scattered coronary artery calcifications seen. Electronically Signed   By: Roanna Raider M.D.   On: 09/26/2016 00:54    Scheduled Meds: . aspirin EC  81 mg Oral Daily  . calcium-vitamin D  1 tablet Oral Daily  . cholecalciferol  1,000 Units Oral Daily  . divalproex  125 mg Oral QHS  . donepezil  10 mg Oral QHS  . insulin aspart  0-15 Units Subcutaneous TID WC  . insulin aspart  0-5 Units Subcutaneous QHS  . levothyroxine  88 mcg Oral QAC breakfast  . mirtazapine  7.5 mg Oral QHS  . multivitamin with minerals  1 tablet Oral Daily  . omega-3 acid ethyl esters  1 g Oral Daily  . pravastatin  20 mg Oral q1800  . QUEtiapine  50 mg Oral QHS  . sodium chloride flush  3 mL Intravenous Q12H  . vitamin B-12  1,000 mcg Oral Daily  . vitamin C  500 mg Oral Daily   Continuous Infusions:   LOS: 2 days    Latrelle Dodrill, MD Pager: Text Page via [email or web address]  [phone]  If 7PM-7AM, please contact night-coverage [email or web address] Password [email or web address] 09/27/2016, 3:22 PM

## 2016-09-28 DIAGNOSIS — R1312 Dysphagia, oropharyngeal phase: Secondary | ICD-10-CM | POA: Diagnosis not present

## 2016-09-28 DIAGNOSIS — R531 Weakness: Secondary | ICD-10-CM | POA: Diagnosis not present

## 2016-09-28 DIAGNOSIS — F0391 Unspecified dementia with behavioral disturbance: Secondary | ICD-10-CM | POA: Diagnosis not present

## 2016-09-28 DIAGNOSIS — E118 Type 2 diabetes mellitus with unspecified complications: Secondary | ICD-10-CM | POA: Diagnosis not present

## 2016-09-28 DIAGNOSIS — S62101A Fracture of unspecified carpal bone, right wrist, initial encounter for closed fracture: Secondary | ICD-10-CM | POA: Diagnosis not present

## 2016-09-28 DIAGNOSIS — R2681 Unsteadiness on feet: Secondary | ICD-10-CM | POA: Diagnosis not present

## 2016-09-28 DIAGNOSIS — E039 Hypothyroidism, unspecified: Secondary | ICD-10-CM | POA: Diagnosis not present

## 2016-09-28 DIAGNOSIS — M79601 Pain in right arm: Secondary | ICD-10-CM | POA: Diagnosis not present

## 2016-09-28 DIAGNOSIS — F0390 Unspecified dementia without behavioral disturbance: Secondary | ICD-10-CM | POA: Diagnosis not present

## 2016-09-28 DIAGNOSIS — R278 Other lack of coordination: Secondary | ICD-10-CM | POA: Diagnosis not present

## 2016-09-28 DIAGNOSIS — R488 Other symbolic dysfunctions: Secondary | ICD-10-CM | POA: Diagnosis not present

## 2016-09-28 DIAGNOSIS — Z4789 Encounter for other orthopedic aftercare: Secondary | ICD-10-CM | POA: Diagnosis not present

## 2016-09-28 DIAGNOSIS — S62101S Fracture of unspecified carpal bone, right wrist, sequela: Secondary | ICD-10-CM | POA: Diagnosis not present

## 2016-09-28 DIAGNOSIS — W19XXXA Unspecified fall, initial encounter: Secondary | ICD-10-CM | POA: Diagnosis not present

## 2016-09-28 DIAGNOSIS — Z794 Long term (current) use of insulin: Secondary | ICD-10-CM | POA: Diagnosis not present

## 2016-09-28 DIAGNOSIS — E11649 Type 2 diabetes mellitus with hypoglycemia without coma: Secondary | ICD-10-CM | POA: Diagnosis not present

## 2016-09-28 DIAGNOSIS — D649 Anemia, unspecified: Secondary | ICD-10-CM | POA: Diagnosis not present

## 2016-09-28 DIAGNOSIS — M545 Low back pain: Secondary | ICD-10-CM | POA: Diagnosis not present

## 2016-09-28 DIAGNOSIS — M25531 Pain in right wrist: Secondary | ICD-10-CM | POA: Diagnosis not present

## 2016-09-28 DIAGNOSIS — Y92129 Unspecified place in nursing home as the place of occurrence of the external cause: Secondary | ICD-10-CM | POA: Diagnosis not present

## 2016-09-28 DIAGNOSIS — G894 Chronic pain syndrome: Secondary | ICD-10-CM | POA: Diagnosis not present

## 2016-09-28 DIAGNOSIS — E876 Hypokalemia: Secondary | ICD-10-CM | POA: Diagnosis not present

## 2016-09-28 DIAGNOSIS — G9340 Encephalopathy, unspecified: Secondary | ICD-10-CM | POA: Diagnosis not present

## 2016-09-28 DIAGNOSIS — R1319 Other dysphagia: Secondary | ICD-10-CM | POA: Diagnosis not present

## 2016-09-28 DIAGNOSIS — E43 Unspecified severe protein-calorie malnutrition: Secondary | ICD-10-CM | POA: Diagnosis not present

## 2016-09-28 DIAGNOSIS — M6281 Muscle weakness (generalized): Secondary | ICD-10-CM | POA: Diagnosis not present

## 2016-09-28 DIAGNOSIS — Z9181 History of falling: Secondary | ICD-10-CM | POA: Diagnosis not present

## 2016-09-28 DIAGNOSIS — N390 Urinary tract infection, site not specified: Secondary | ICD-10-CM | POA: Diagnosis not present

## 2016-09-28 DIAGNOSIS — S6990XA Unspecified injury of unspecified wrist, hand and finger(s), initial encounter: Secondary | ICD-10-CM | POA: Diagnosis not present

## 2016-09-28 DIAGNOSIS — R4182 Altered mental status, unspecified: Secondary | ICD-10-CM | POA: Diagnosis not present

## 2016-09-28 DIAGNOSIS — N179 Acute kidney failure, unspecified: Secondary | ICD-10-CM | POA: Diagnosis not present

## 2016-09-28 LAB — GLUCOSE, CAPILLARY
GLUCOSE-CAPILLARY: 230 mg/dL — AB (ref 65–99)
GLUCOSE-CAPILLARY: 229 mg/dL — AB (ref 65–99)

## 2016-09-28 LAB — CBC
HEMATOCRIT: 31.2 % — AB (ref 36.0–46.0)
HEMOGLOBIN: 9.8 g/dL — AB (ref 12.0–15.0)
MCH: 25.6 pg — ABNORMAL LOW (ref 26.0–34.0)
MCHC: 31.4 g/dL (ref 30.0–36.0)
MCV: 81.5 fL (ref 78.0–100.0)
Platelets: 203 10*3/uL (ref 150–400)
RBC: 3.83 MIL/uL — ABNORMAL LOW (ref 3.87–5.11)
RDW: 16.1 % — ABNORMAL HIGH (ref 11.5–15.5)
WBC: 6.3 10*3/uL (ref 4.0–10.5)

## 2016-09-28 LAB — BASIC METABOLIC PANEL
ANION GAP: 10 (ref 5–15)
BUN: 17 mg/dL (ref 6–20)
CHLORIDE: 102 mmol/L (ref 101–111)
CO2: 25 mmol/L (ref 22–32)
Calcium: 8.4 mg/dL — ABNORMAL LOW (ref 8.9–10.3)
Creatinine, Ser: 1.05 mg/dL — ABNORMAL HIGH (ref 0.44–1.00)
GFR calc non Af Amer: 50 mL/min — ABNORMAL LOW (ref >60)
GFR, EST AFRICAN AMERICAN: 58 mL/min — AB (ref >60)
Glucose, Bld: 239 mg/dL — ABNORMAL HIGH (ref 65–99)
Potassium: 3.8 mmol/L (ref 3.5–5.1)
Sodium: 137 mmol/L (ref 135–145)

## 2016-09-28 LAB — MAGNESIUM: MAGNESIUM: 1.7 mg/dL (ref 1.7–2.4)

## 2016-09-28 NOTE — [place] FL2 (Signed)
[place] MEDICAID FL2 LEVEL OF CARE SCREENING TOOL     IDENTIFICATION  Patient Name: Sheena [name] Birthdate: Oct 29, 1940 Sex: female Admission Date (Current Location): 09/25/2016  [facility] and [place] Number:  Producer, television/film/video and Address:  [facility] ([place]),  [address] [place], [place] [address]      Provider Number: [id number]  Attending Physician Name and Address:  Lenox Ponds, MD  Relative Name and Phone Number:  [place], [place] [phone]     Current Level of Care: Hospital Recommended Level of Care: Skilled Nursing Facility Prior Approval Number:    Date Approved/Denied:   PASRR Number: [id number]  Discharge Plan: SNF    Current Diagnoses: Patient Active Problem List   Diagnosis Date Noted  . Fall 09/25/2016  . Acute encephalopathy 09/25/2016  . Wrist fracture, closed, right, initial encounter 09/25/2016  . Hypoglycemia 09/25/2016  . Normocytic anemia 09/25/2016  . Abdominal pain 09/25/2016  . Closed fracture of right wrist   . Psychosis 12/22/2012  . High anion gap metabolic acidosis 12/17/2012  . DKA (diabetic ketoacidoses) (HCC) 12/17/2012  . Altered mental status 12/17/2012  . Severe Diabetic gastroparesis 12/17/2012  . H/O diarrhea 12/17/2012  . Dementia 12/17/2012  . Hypothyroidism 12/17/2012  . Diabetes mellitus, type 2 (HCC) 12/17/2012  . Hypokalemia 12/17/2012  . Dehydration 12/17/2012  . Chronic pain 12/17/2012    Orientation RESPIRATION BLADDER Height & Weight     Self  Normal Incontinent Weight: 136 lb 7.4 oz (61.9 kg) Height:   (162.6 cm)  BEHAVIORAL SYMPTOMS/MOOD NEUROLOGICAL BOWEL NUTRITION STATUS      Continent Diet (Regular)  AMBULATORY STATUS COMMUNICATION OF NEEDS Skin   Extensive Assist Verbally Normal                       Personal Care Assistance Level of Assistance  Bathing, Feeding, Dressing Bathing Assistance: Limited assistance Feeding assistance: Independent Dressing Assistance: Limited  assistance     Functional Limitations Info  Sight, Hearing, Speech Sight Info: Impaired Hearing Info: Impaired Speech Info: Adequate    SPECIAL CARE FACTORS FREQUENCY  PT (By licensed PT), OT (By licensed OT)     PT Frequency: 5 OT Frequency: 5            Contractures Contractures Info: Not present    Additional Factors Info  Code Status, Allergies Code Status Info: DNR Allergies Info: Bactrim           Current Medications (09/28/2016):  This is the current hospital active medication list Current Facility-Administered Medications  Medication Dose Route Frequency Provider Last Rate Last Dose  . acetaminophen (TYLENOL) tablet 650 mg  650 mg Oral Q6H PRN Lorretta Harp, MD       Or  . acetaminophen (TYLENOL) suppository 650 mg  650 mg Rectal Q6H PRN Lorretta Harp, MD      . aspirin EC tablet 81 mg  81 mg Oral Daily Lorretta Harp, MD   81 mg at 09/27/16 1143  . calcium-vitamin D (OSCAL WITH D) 500-200 MG-UNIT per tablet 1 tablet  1 tablet Oral Daily Lorretta Harp, MD   1 tablet at 09/27/16 1144  . cholecalciferol (VITAMIN D) tablet 1,000 Units  1,000 Units Oral Daily Lorretta Harp, MD   1,000 Units at 09/27/16 1144  . dextrose 50 % solution 50 mL  50 mL Intravenous PRN Lorretta Harp, MD      . divalproex (DEPAKOTE SPRINKLE) capsule 125 mg  125 mg Oral QHS Lorretta Harp,  MD   125 mg at 09/27/16 2129  . donepezil (ARICEPT) tablet 10 mg  10 mg Oral QHS Lorretta Harp, MD   10 mg at 09/27/16 2129  . hydrALAZINE (APRESOLINE) injection 5 mg  5 mg Intravenous Q2H PRN Lorretta Harp, MD      . ibuprofen (ADVIL,MOTRIN) tablet 600 mg  600 mg Oral Q6H PRN Lorretta Harp, MD      . insulin aspart (novoLOG) injection 0-15 Units  0-15 Units Subcutaneous TID WC Lenox Ponds, MD   5 Units at 09/27/16 1831  . insulin aspart (novoLOG) injection 0-5 Units  0-5 Units Subcutaneous QHS Lenox Ponds, MD      . levothyroxine (SYNTHROID, LEVOTHROID) tablet 88 mcg  88 mcg Oral QAC breakfast Lorretta Harp, MD   88 mcg at 09/27/16 0905   . mirtazapine (REMERON) tablet 7.5 mg  7.5 mg Oral QHS Lorretta Harp, MD   7.5 mg at 09/27/16 2129  . multivitamin with minerals tablet 1 tablet  1 tablet Oral Daily Lorretta Harp, MD   1 tablet at 09/27/16 1145  . omega-3 acid ethyl esters (LOVAZA) capsule 1 g  1 g Oral Daily Lorretta Harp, MD   1 g at 09/27/16 1143  . ondansetron (ZOFRAN) tablet 4 mg  4 mg Oral Q6H PRN Lorretta Harp, MD       Or  . ondansetron [facility]) injection 4 mg  4 mg Intravenous Q6H PRN Lorretta Harp, MD      . oxyCODONE-acetaminophen (PERCOCET/ROXICET) 5-325 MG per tablet 1 tablet  1 tablet Oral Q4H PRN Lorretta Harp, MD   1 tablet at 09/27/16 2129  . pravastatin (PRAVACHOL) tablet 20 mg  20 mg Oral q1800 Lorretta Harp, MD   20 mg at 09/27/16 1830  . QUEtiapine (SEROQUEL) tablet 50 mg  50 mg Oral QHS Lorretta Harp, MD   50 mg at 09/27/16 2129  . sodium chloride flush (NS) 0.9 % injection 3 mL  3 mL Intravenous Q12H Lorretta Harp, MD   3 mL at 09/25/16 2353  . vitamin B-12 (CYANOCOBALAMIN) tablet 1,000 mcg  1,000 mcg Oral Daily Lorretta Harp, MD   1,000 mcg at 09/27/16 1144  . vitamin C (ASCORBIC ACID) tablet 500 mg  500 mg Oral Daily Lorretta Harp, MD   500 mg at 09/27/16 1145     Discharge Medications: Please see discharge summary for a list of discharge medications.  Relevant Imaging Results:  Relevant Lab Results:   Additional Information ZOX:[id number]  Clearance Coots, LCSW

## 2016-09-28 NOTE — Discharge Summary (Addendum)
Physician Discharge Summary  Sheena [name]  RUE:[id number]  DOB: 08/11/1940  DOA: 09/25/2016 PCP: Georgann Housekeeper, MD  Admit date: 09/25/2016 Discharge date: 09/28/2016  Admitted From: Home  Disposition: SNF  Recommendations for Outpatient Follow-up:  1. Follow up with PCP in 1-2 weeks 2. Please obtain BMP/CBC in one week to check Cr and Hgb  3. Patient will need orthopedic outpatient follow up in 1 week  4. Please obtain R wrist xray in 3-4 days.  5. Keep soft cast in place   Discharge Condition: Improved  CODE STATUS: DNR Diet recommendation: Heart Healthy  Brief/Interim Summary: 76 year old female with medical history significant for severe dementia, hypertension, hyperlipidemia, diabetes mellitus is brought to the emergency department after she was found in the floor confuse and complaining of abdominal pain, nausea and vomiting. Patient was found to have right wrist fracture, electrolyte imbalance and hypoglycemia with blood sugar of 30.   Subjective: Patient seen and examined, unable to perform history, due to hard hearing and poor comprehension. Patient awake, had some breakfast this AM. Hgb stable, Cr improving.   Discharge Diagnoses/Hospital Course:  Right wrist fracture - nondisplaced Orthopedic surgery consulted - recommending to keep soft cast in place, repeat x-ray in 5-7 day and they will evaluate as an outpatient. - Make appointment with Dr Eulah Pont.  PT/OT recommending SNF   Hypoglycemia - likely due to continuation of insulin and poor appetite. - Improved  Likely the cause of the fall  Continue to monitor CBG's 4 times a day  AKI - likely due to contrast and poor oral intake - improving  Avoid nephrotoxin  Check BMP in 1 week   Diabetes mellitus type 2 - on admission hypoglycemic  Recommending to discontinue Lantus patient's A1C 6.8, with her condition and her poor oral intake, basal insulin could be harmful.  Recommend continue sliding scale insulin at SNF   Monitor CBG's 4 times a day   Acute metabolic encephalopathy/Severe dementia - likely multifactorial from fall, natural deconditioning of dementia and poor oral intake, at baseline per family members  CT head negative for acute abnormalities Patient not really verbal, ? If due to hearing vs poor comprehension.   Normocytic anemia initial hemoglobin 8.5 - ? Lab error Repeat hemoglobin 10, FOBT negative, slight drop on Hgb likely dilutional  Anemia panel normal Check CBC in 1 week   Abdominal pain - unclear source, may be due to trauma from fall - resolved  CT abdomen normal  Hypokalemia and hypomagnesemia - Likely secondary to vomiting and poor oral intake Resolved  Check levels in 1 week   All other chronic medical condition were stable during the hospitalization.  Patient was seen by physical therapy, recommending SNF  On the day of the discharge the patient's vitals were stable, and no other acute medical condition were reported by patient. Patient was felt safe to be discharge to SNF  Discharge Instructions  You were cared for by a hospitalist during your hospital stay. If you have any questions about your discharge medications or the care you received while you were in the hospital after you are discharged, you can call the unit and asked to speak with the hospitalist on call if the hospitalist that took care of you is not available. Once you are discharged, your primary care physician will handle any further medical issues. Please note that NO REFILLS for any discharge medications will be authorized once you are discharged, as it is imperative that you return to your primary care physician (  or establish a relationship with a primary care physician if you do not have one) for your aftercare needs so that they can reassess your need for medications and monitor your lab values.  Discharge Instructions    Call MD for:  difficulty breathing, headache or visual disturbances    Complete  by:  As directed    Call MD for:  extreme fatigue    Complete by:  As directed    Call MD for:  hives    Complete by:  As directed    Call MD for:  persistant dizziness or light-headedness    Complete by:  As directed    Call MD for:  persistant nausea and vomiting    Complete by:  As directed    Call MD for:  redness, tenderness, or signs of infection (pain, swelling, redness, odor or green/yellow discharge around incision site)    Complete by:  As directed    Call MD for:  severe uncontrolled pain    Complete by:  As directed    Call MD for:  temperature >100.4    Complete by:  As directed    Diet - low sodium heart healthy    Complete by:  As directed    Diet Carb Modified    Complete by:  As directed    Increase activity slowly    Complete by:  As directed      Allergies as of 09/28/2016      Reactions   Bactrim Nausea And Vomiting      Medication List    STOP taking these medications   insulin glargine 100 UNIT/ML injection Commonly known as:  LANTUS     TAKE these medications   aspirin EC 81 MG tablet Take 81 mg by mouth daily.   Calcium-Vitamin D 600-200 MG-UNIT Caps Take 1 tablet by mouth daily.   cholecalciferol 1000 units tablet Commonly known as:  VITAMIN D Take 1,000 Units by mouth daily.   divalproex 125 MG capsule Commonly known as:  DEPAKOTE SPRINKLE Take 1 capsule (125 mg total) by mouth every 12 (twelve) hours.   divalproex 125 MG capsule Commonly known as:  DEPAKOTE SPRINKLE Take 125 mg by mouth at bedtime.   donepezil 10 MG tablet Commonly known as:  ARICEPT Take 10 mg by mouth at bedtime.   fish oil-omega-3 fatty acids 1000 MG capsule Take 1 g by mouth daily.   ibuprofen 200 MG tablet Commonly known as:  ADVIL,MOTRIN Take 600 mg by mouth every 6 (six) hours as needed. pain   insulin aspart 100 UNIT/ML injection Commonly known as:  novoLOG Inject 0-10 Units into the skin 3 (three) times daily as needed for high blood sugar.    levothyroxine 88 MCG tablet Commonly known as:  SYNTHROID, LEVOTHROID Take 88 mcg by mouth daily before breakfast.   lovastatin 20 MG tablet Commonly known as:  MEVACOR Take 20 mg by mouth at bedtime.   magnesium chloride 64 MG Tbec SR tablet Commonly known as:  SLOW-MAG Take 2 tablets (128 mg total) by mouth daily.   metFORMIN 500 MG tablet Commonly known as:  GLUCOPHAGE Take 1 tablet (500 mg total) by mouth 2 (two) times daily with a meal. What changed:  how much to take   mirtazapine 7.5 MG tablet Commonly known as:  REMERON Take 7.5 mg by mouth at bedtime.   multivitamin with minerals Tabs tablet Take 1 tablet by mouth daily.   potassium chloride 10 MEQ tablet Commonly known  as:  K-DUR,KLOR-CON Take 1 tablet (10 mEq total) by mouth 2 (two) times daily.   QUEtiapine 50 MG tablet Commonly known as:  SEROQUEL Take 1 tablet (50 mg total) by mouth at bedtime.   vitamin B-12 1000 MCG tablet Commonly known as:  CYANOCOBALAMIN Take 1,000 mcg by mouth daily.   vitamin C 500 MG tablet Commonly known as:  ASCORBIC ACID Take 500 mg by mouth daily.      Follow-up Information    MURPHY, TIMOTHY D, MD. Schedule an appointment as soon as possible for a visit in 1 week(s).   Specialty:  Orthopedic Surgery Why:  Hospital follow up  Contact information: [address] [place] [address] [phone]          Allergies  Allergen Reactions  . Bactrim Nausea And Vomiting    Consultations: Orthopedic surgery   Procedures/Studies: Dg Lumbar Spine Complete  Result Date: 09/25/2016 CLINICAL DATA:  Status post unwitnessed fall at home. EXAM: LUMBAR SPINE - COMPLETE 4+ VIEW COMPARISON:  Lumbar spine series of November 28, 2011 FINDINGS: The patient has undergone previous kyphoplasty at L1 and L3. Partial compressions at these levels are stable. There is partial compression of L4 which is not being treated. This is also stable. L2 and L5 exhibit normal  vertebral body height. There is no spondylolisthesis. The pedicles of L2 and L4 and L5 are grossly normal where visualized. The observed portions of the sacrum exhibit no acute abnormalities. There is gentle curvature of the lumbar spine convex toward the right. IMPRESSION: Chronic changes within the lumbar spine. No acute fracture or dislocation. Electronically Signed   By: David  [place] M.D.   On: 09/25/2016 15:18   Dg Pelvis 1-2 Views  Result Date: 09/25/2016 CLINICAL DATA:  Status post fall. Patient reports low back and tail bone pain EXAM: PELVIS - 1-2 VIEW COMPARISON:  Limited views of the pelvis for all lumbar spine series of March 11, 2011. FINDINGS: The bony pelvis is subjectively adequately mineralized the iliac bones appear intact. The observed portions of the sacrum and SI joints exhibit no acute abnormalities. There is a stable sclerotic focus in inferior aspect of the left iliac bone. Old fractures of the pubic rami bilaterally are visible. No acute pubic ramus fracture is observed. There is mild narrowing of the right hip joint space medially. The left hip joint space is reasonably well maintained. The femoral heads, necks, intertrochanteric, and immediate sub trochanteric regions appear normal. IMPRESSION: Chronic deformity of the pubic rami consistent with previous fractures. No acute pelvic fracture is observed. The observed portions of the hips reveal no acute abnormalities. Electronically Signed   By: David  [place] M.D.   On: 09/25/2016 15:16   Dg Shoulder Right  Result Date: 09/25/2016 CLINICAL DATA:  Status post fall.  Right shoulder pain. EXAM: RIGHT SHOULDER - 2+ VIEW COMPARISON:  None. FINDINGS: There is no evidence of fracture or dislocation. There is generalized osteopenia. The Soft tissues are unremarkable. IMPRESSION: No acute osseous injury of the right shoulder. Electronically Signed   By: Elige Ko   On: 09/25/2016 15:15   Dg Wrist Complete Right  Result Date:  09/25/2016 CLINICAL DATA:  Status post fall.  Erythematous swollen wrist. EXAM: RIGHT WRIST - COMPLETE 3+ VIEW COMPARISON:  None in PACs FINDINGS: The bones are subjectively osteopenic. There is a impacted nondisplaced fracture of the distal radial metaphysis. There is an ulnar styloid fracture. The carpal bones are grossly intact. There is mild narrowing  of the radiocarpal joint. There is mild degenerative change of the first [facility] joint. There is calcification of the triangular fibrocartilage. The metacarpals appear intact. IMPRESSION: Nondisplaced impacted fracture of the distal right radial metaphysis. Minimally distracted ulnar styloid fracture. Electronically Signed   By: David  [place] M.D.   On: 09/25/2016 15:11   Ct Head Wo Contrast  Result Date: 09/25/2016 CLINICAL DATA:  Recent fall EXAM: CT HEAD WITHOUT CONTRAST CT CERVICAL SPINE WITHOUT CONTRAST TECHNIQUE: Multidetector CT imaging of the head and cervical spine was performed following the standard protocol without intravenous contrast. Multiplanar CT image reconstructions of the cervical spine were also generated. COMPARISON:  12/16/2012 FINDINGS: CT HEAD FINDINGS Brain: Mild atrophic changes and chronic white matter ischemic change is seen. No findings to suggest acute hemorrhage, acute infarction or space-occupying mass lesion are noted. Vascular: No hyperdense vessel or unexpected calcification. Skull: Postsurgical changes are noted in the mastoid air cells on the right. A cochlear implantation device is seen on the right. No acute fracture is noted. Sinuses/Orbits: No acute finding. Other: None. CT CERVICAL SPINE FINDINGS Alignment: Within normal limits Skull base and vertebrae: 7 cervical vertebra are well visualized. Vertebral body height is well maintained. Osteophytic changes are noted at multiple levels as well as in the upper thoracic levels. Mild facet hypertrophic changes are seen. No findings to suggest acute fracture or acute facet  abnormality are noted. Soft tissues and spinal canal: Heavy carotid calcifications are noted bilaterally with changes suggestive of high-grade stenosis. The need for further evaluation can be determined on a clinical basis. Disc levels:  Disc space narrowing is noted at C5-6. Upper chest: Within normal limits. IMPRESSION: CT of the head: Chronic changes without acute abnormality. CT of the cervical spine: Degenerative change without acute abnormality. Electronically Signed   By: Alcide Clever M.D.   On: 09/25/2016 14:51   Ct Cervical Spine Wo Contrast  Result Date: 09/25/2016 CLINICAL DATA:  Recent fall EXAM: CT HEAD WITHOUT CONTRAST CT CERVICAL SPINE WITHOUT CONTRAST TECHNIQUE: Multidetector CT imaging of the head and cervical spine was performed following the standard protocol without intravenous contrast. Multiplanar CT image reconstructions of the cervical spine were also generated. COMPARISON:  12/16/2012 FINDINGS: CT HEAD FINDINGS Brain: Mild atrophic changes and chronic white matter ischemic change is seen. No findings to suggest acute hemorrhage, acute infarction or space-occupying mass lesion are noted. Vascular: No hyperdense vessel or unexpected calcification. Skull: Postsurgical changes are noted in the mastoid air cells on the right. A cochlear implantation device is seen on the right. No acute fracture is noted. Sinuses/Orbits: No acute finding. Other: None. CT CERVICAL SPINE FINDINGS Alignment: Within normal limits Skull base and vertebrae: 7 cervical vertebra are well visualized. Vertebral body height is well maintained. Osteophytic changes are noted at multiple levels as well as in the upper thoracic levels. Mild facet hypertrophic changes are seen. No findings to suggest acute fracture or acute facet abnormality are noted. Soft tissues and spinal canal: Heavy carotid calcifications are noted bilaterally with changes suggestive of high-grade stenosis. The need for further evaluation can be  determined on a clinical basis. Disc levels:  Disc space narrowing is noted at C5-6. Upper chest: Within normal limits. IMPRESSION: CT of the head: Chronic changes without acute abnormality. CT of the cervical spine: Degenerative change without acute abnormality. Electronically Signed   By: Alcide Clever M.D.   On: 09/25/2016 14:51   Ct Abdomen Pelvis W Contrast  Result Date: 09/26/2016 CLINICAL DATA:  Status post fall,  with lower back pain. Concern for abdominal injury. Initial encounter. EXAM: CT ABDOMEN AND PELVIS WITH CONTRAST TECHNIQUE: Multidetector CT imaging of the abdomen and pelvis was performed using the standard protocol following bolus administration of intravenous contrast. CONTRAST:  ISOVUE-300 IOPAMIDOL (ISOVUE-300) INJECTION 61% COMPARISON:  Left hip CT performed 12/2002, and abdominal ultrasound performed 11/21/2010 FINDINGS: Lower chest: Mild bibasilar atelectasis is noted. Scattered coronary artery calcifications are seen. Hepatobiliary: The liver is unremarkable in appearance. The gallbladder is unremarkable in appearance. The common bile duct remains normal in caliber. Pancreas: The pancreas is within normal limits. Spleen: The spleen is unremarkable in appearance. Adrenals/Urinary Tract: The adrenal glands are unremarkable in appearance. The kidneys are within normal limits. There is no evidence of hydronephrosis. No renal or ureteral stones are identified. No perinephric stranding is seen. Stomach/Bowel: The stomach is unremarkable in appearance. The small bowel is within normal limits. The appendix is not visualized; there is no evidence for appendicitis. The colon is unremarkable in appearance. Vascular/Lymphatic: Diffuse calcification is seen along the abdominal aorta and its branches. There is mild luminal narrowing at the distal abdominal aorta. The inferior vena cava is grossly unremarkable. No retroperitoneal lymphadenopathy is seen. No pelvic sidewall lymphadenopathy is  identified. Reproductive: The bladder is mildly distended and grossly unremarkable. The patient is status post hysterectomy. The ovaries are relatively symmetric. No suspicious adnexal masses are seen. Other: No additional soft tissue abnormalities are seen. Musculoskeletal: No acute osseous abnormalities are identified. The patient is status post vertebroplasty at L1 and L3, and chronic compression deformity is also noted at L4. Chronic bilateral superior and inferior rami deformities are noted. The visualized musculature is unremarkable in appearance. IMPRESSION: 1. No evidence of traumatic injury to the abdomen or pelvis. 2. Chronic compression deformity at L4. 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 3. Diffuse aortic atherosclerosis. Mild luminal narrowing at the distal abdominal aorta. 4. Mild bibasilar atelectasis. 5. Scattered coronary artery calcifications seen. Electronically Signed   By: Roanna Raider M.D.   On: 09/26/2016 00:54    Discharge Exam: Vitals:   09/27/16 2158 09/28/16 0455  BP: (!) 164/76 134/65  Pulse: 95 97  Resp: 18 18  Temp: 98.7 F (37.1 C) 98 F (36.7 C)   Vitals:   09/27/16 0422 09/27/16 1521 09/27/16 2158 09/28/16 0455  BP: (!) 154/71 (!) 186/75 (!) 164/76 134/65  Pulse: (!) 114 97 95 97  Resp: Temp: 98.7 F (37.1 C) 98.6 F (37 C) 98.7 F (37.1 C) 98 F (36.7 C)  TempSrc: Oral Oral Oral Oral  SpO2: 94% 93% 98% 97%  Weight:      Height:       General: Awake, non verbal, respond to painful stimuli.  Cardiovascular: RRR, S1/S2  Respiratory: CTA bilaterally. Abdominal: Soft, NT, ND Extremities: Soft splint on R hand, poor mobility of fingers due to pain. R arm seems normal   The results of significant diagnostics from this hospitalization (including imaging, microbiology, ancillary and laboratory) are listed below for reference.     Microbiology: Recent Results (from the past 240 hour(s))  Urine culture     Status: Abnormal    Collection Time: 09/25/16 10:40 PM  Result Value Ref Range Status   Specimen Description URINE, CLEAN CATCH  Final   Special Requests NONE  Final   Culture MULTIPLE SPECIES PRESENT, SUGGEST RECOLLECTION (A)  Final   Report Status 09/27/2016 FINAL  Final     Labs: BNP (last 3 results)  No results for input(s): BNP in the last 8760 hours. Basic Metabolic Panel:  Recent Labs Lab 09/25/16 1527 09/25/16 2106 09/25/16 2116 09/26/16 0545 09/27/16 0538 09/28/16 0515  NA 140 143 146* 140 135 137  K 3.7 2.4* 2.4* 3.5 3.5 3.8  CL 103 113* 110 107 101 102  CO2 25 23  --  GLUCOSE 102* 29* 30* 128* 235* 239*  BUN 23* CREATININE 0.93 0.69 0.70 0.77 1.09* 1.05*  CALCIUM 9.3 7.5*  --  8.3* 8.3* 8.4*  MG  --  0.9*  --  2.1 1.3*  1.2* 1.7  PHOS  --   --   --   --  3.3  --    Liver Function Tests: No results for input(s): AST, ALT, ALKPHOS, BILITOT, PROT, ALBUMIN in the last 168 hours.  Recent Labs Lab 09/25/16 2158  LIPASE 18   No results for input(s): AMMONIA in the last 168 hours. CBC:  Recent Labs Lab 09/25/16 2107 09/25/16 2116 09/26/16 0545 09/27/16 0538 09/28/16 0515  WBC 7.5  --  7.0 7.0 6.3  NEUTROABS 5.6  --   --  4.4  --   HGB 8.7* 8.5* 10.0* 9.5* 9.8*  HCT 27.6* 25.0* 32.2* 30.2* 31.2*  MCV 81.9  --  82.4 81.4 81.5  PLT 203  --  238 208 203   Cardiac Enzymes:  Recent Labs Lab 09/25/16 1527  CKTOTAL 199   BNP: Invalid input(s): POCBNP CBG:  Recent Labs Lab 09/27/16 1203 09/27/16 1731 09/27/16 2124 09/28/16 0916 09/28/16 1150  GLUCAP 152* 236* 190* 230* 229*   D-Dimer No results for input(s): DDIMER in the last 72 hours. Hgb A1c  Recent Labs  09/26/16 0545  HGBA1C 6.8*   Lipid Profile  Recent Labs  09/26/16 0545  CHOL 200  HDL 42  LDLCALC 121*  TRIG 187*  CHOLHDL 4.8   Thyroid function studies No results for input(s): TSH, T4TOTAL, T3FREE, THYROIDAB in the last 72 hours.  Invalid input(s):  FREET3 Anemia work up  Recent Labs  09/25/16 2255  VITAMINB12 2,207*  FOLATE 45.5  FERRITIN 7*  TIBC 340  IRON 36  RETICCTPCT 1.5   Urinalysis    Component Value Date/Time   COLORURINE YELLOW 09/25/2016 1329   APPEARANCEUR CLEAR 09/25/2016 1329   LABSPEC 1.026 09/25/2016 1329   PHURINE 5.0 09/25/2016 1329   GLUCOSEU NEGATIVE 09/25/2016 1329   HGBUR NEGATIVE 09/25/2016 1329   BILIRUBINUR NEGATIVE 09/25/2016 1329   KETONESUR 5 (A) 09/25/2016 1329   PROTEINUR NEGATIVE 09/25/2016 1329   UROBILINOGEN 1.0 12/16/2012 1937   NITRITE NEGATIVE 09/25/2016 1329   LEUKOCYTESUR NEGATIVE 09/25/2016 1329   Sepsis Labs Invalid input(s): PROCALCITONIN,  WBC,  LACTICIDVEN Microbiology Recent Results (from the past 240 hour(s))  Urine culture     Status: Abnormal   Collection Time: 09/25/16 10:40 PM  Result Value Ref Range Status   Specimen Description URINE, CLEAN CATCH  Final   Special Requests NONE  Final   Culture MULTIPLE SPECIES PRESENT, SUGGEST RECOLLECTION (A)  Final   Report Status 09/27/2016 FINAL  Final    Time coordinating discharge: 40 minutes  SIGNED:  Latrelle Dodrill, MD  [facility] 09/28/2016, 12:00 PM  Pager please text page via  [email or web address] Password [email or web address]

## 2016-09-28 NOTE — Clinical Social Work Placement (Addendum)
[facility] scheduled for 2:30pm pick up.  Son to fillout paperwork at 2:00pm.  CLINICAL SOCIAL WORK PLACEMENT  NOTE  Date:  09/28/2016  Patient Details  Name: Sheena [name] MRN: [id number] Date of Birth: 1940/12/28  Clinical Social Work is seeking post-discharge placement for this patient at the Skilled  Nursing Facility level of care (*CSW will initial, date and re-position this form in  chart as items are completed):  Yes   Patient/family provided with [facility] Clinical Social Work Department's list of facilities offering this level of care within the geographic area requested by the patient (or if unable, by the patient's family).  Yes   Patient/family informed of their freedom to choose among providers that offer the needed level of care, that participate in Medicare, Medicaid or managed care program needed by the patient, have an available bed and are willing to accept the patient.  Yes   Patient/family informed of [facility]'s ownership interest in [facility] and [facility], as well as of the fact that they are under no obligation to receive care at these facilities.  PASRR submitted to EDS on       PASRR number received on       Existing PASRR number confirmed on 09/28/16     FL2 transmitted to all facilities in geographic area requested by pt/family on       FL2 transmitted to all facilities within larger geographic area on 09/28/16     Patient informed that his/her managed care company has contracts with or will negotiate with certain facilities, including the following:  [facility]     No   Patient/family informed of bed offers received.  Patient chooses bed at [facility]     Physician recommends and patient chooses bed at [facility]    Patient to be transferred to [facility] on 09/28/16.  Patient to be transferred to facility by [facility]     Patient family notified on 09/28/16 of transfer.  Name of family member notified:  Son at Bedside.       PHYSICIAN Please sign DNR     Additional Comment:    _______________________________________________ Clearance Coots, LCSW 09/28/2016, 1:20 PM

## 2016-09-28 NOTE — Progress Notes (Signed)
Report called to Sheralyn Boatman at [facility].

## 2016-10-03 ENCOUNTER — Encounter: Payer: Self-pay | Admitting: Internal Medicine

## 2016-10-03 ENCOUNTER — Non-Acute Institutional Stay (SKILLED_NURSING_FACILITY): Payer: Medicare Other | Admitting: Internal Medicine

## 2016-10-03 DIAGNOSIS — E039 Hypothyroidism, unspecified: Secondary | ICD-10-CM | POA: Diagnosis not present

## 2016-10-03 DIAGNOSIS — E43 Unspecified severe protein-calorie malnutrition: Secondary | ICD-10-CM | POA: Diagnosis not present

## 2016-10-03 DIAGNOSIS — N179 Acute kidney failure, unspecified: Secondary | ICD-10-CM

## 2016-10-03 DIAGNOSIS — E118 Type 2 diabetes mellitus with unspecified complications: Secondary | ICD-10-CM

## 2016-10-03 DIAGNOSIS — E876 Hypokalemia: Secondary | ICD-10-CM

## 2016-10-03 DIAGNOSIS — Z794 Long term (current) use of insulin: Secondary | ICD-10-CM

## 2016-10-03 DIAGNOSIS — D649 Anemia, unspecified: Secondary | ICD-10-CM

## 2016-10-03 DIAGNOSIS — R531 Weakness: Secondary | ICD-10-CM

## 2016-10-03 DIAGNOSIS — S62101S Fracture of unspecified carpal bone, right wrist, sequela: Secondary | ICD-10-CM | POA: Diagnosis not present

## 2016-10-03 DIAGNOSIS — R1319 Other dysphagia: Secondary | ICD-10-CM | POA: Diagnosis not present

## 2016-10-03 DIAGNOSIS — F0391 Unspecified dementia with behavioral disturbance: Secondary | ICD-10-CM | POA: Diagnosis not present

## 2016-10-03 NOTE — Progress Notes (Signed)
LOCATION: [facility]   PCP: Georgann Housekeeper, MD   Code Status: DNR  Goals of care: Advanced Directive information Advanced Directives 10/03/2016  Does Patient Have a Medical Advance Directive? Yes  Type of Advance Directive Out of facility DNR (pink MOST or yellow form)  Does patient want to make changes to medical advance directive? No - Patient declined  Would patient like information on creating a medical advance directive? -  Pre-existing out of facility DNR order (yellow form or pink MOST form) -       Extended Emergency Contact Information Primary Emergency Contact: Dahmen,David Address: [address] [place], [place] [address] [place] Home Phone: [phone] Relation: Son Secondary Emergency Contact: Dreese,Laura  [place] Phone: [phone] Relation: Other   Allergies  Allergen Reactions  . Bactrim Nausea And Vomiting    Chief Complaint  Patient presents with  . New Admit To SNF    New Admission Visit      HPI:  Patient is a 76 y.o. female seen today for short term rehabilitation post hospital admission from 09/25/16-09/28/16 with right wrist fracture, hypoglycemia, acute renal failure and electrolyte imbalance. She was seen by orthopedic and a soft cast was placed. Her hypoglycemia was thought to be from use of insulin with poor intake. She received iv fluids with D5 and her lantus was discontinued. CT head was negative for acute intracranial abnormalities. She has medical history of severe dementia, HTN, HLD, DM among others. She is seen in her room today.   Review of Systems: non verbal, no HPI or ROS    Past Medical History:  Diagnosis Date  . Dementia   . Diabetes mellitus   . Hypertension    Past Surgical History:  Procedure Laterality Date  . BACK SURGERY    . INNER EAR SURGERY    . NOSE SURGERY     Social History:   reports that she has never smoked. She has never used smokeless tobacco.  She reports that she does not drink alcohol or use drugs.  Family History  Problem Relation Age of Onset  . Dementia Mother     Medications: Allergies as of 10/03/2016      Reactions   Bactrim Nausea And Vomiting      Medication List       Accurate as of 10/03/16 10:58 AM. Always use your most recent med list.          aspirin EC 81 MG tablet Take 81 mg by mouth daily.   Calcium-Vitamin D 600-200 MG-UNIT Caps Take 1 tablet by mouth daily.   cholecalciferol 1000 units tablet Commonly known as:  VITAMIN D Take 1,000 Units by mouth daily.   divalproex 125 MG capsule Commonly known as:  DEPAKOTE SPRINKLE Take 1 capsule (125 mg total) by mouth every 12 (twelve) hours.   divalproex 125 MG capsule Commonly known as:  DEPAKOTE SPRINKLE Take 125 mg by mouth at bedtime.   donepezil 10 MG tablet Commonly known as:  ARICEPT Take 10 mg by mouth at bedtime.   fish oil-omega-3 fatty acids 1000 MG capsule Take 1 g by mouth daily.   ibuprofen 200 MG tablet Commonly known as:  ADVIL,MOTRIN Take 600 mg by mouth every 6 (six) hours as needed. pain   insulin aspart 100 UNIT/ML injection Commonly known as:  novoLOG Inject 0-10 Units into the skin 3 (three) times daily as needed for high  blood sugar.   levothyroxine 88 MCG tablet Commonly known as:  SYNTHROID, LEVOTHROID Take 88 mcg by mouth daily before breakfast.   lovastatin 20 MG tablet Commonly known as:  MEVACOR Take 20 mg by mouth at bedtime.   magnesium chloride 64 MG Tbec SR tablet Commonly known as:  SLOW-MAG Take 2 tablets (128 mg total) by mouth daily.   metFORMIN 500 MG tablet Commonly known as:  GLUCOPHAGE Take 1 tablet (500 mg total) by mouth 2 (two) times daily with a meal.   mirtazapine 7.5 MG tablet Commonly known as:  REMERON Take 7.5 mg by mouth at bedtime.   multivitamin with minerals Tabs tablet Take 1 tablet by mouth daily.   potassium chloride 10 MEQ tablet Commonly known as:   K-DUR,KLOR-CON Take 1 tablet (10 mEq total) by mouth 2 (two) times daily.   QUEtiapine 50 MG tablet Commonly known as:  SEROQUEL Take 1 tablet (50 mg total) by mouth at bedtime.   UNABLE TO FIND Med Name: Med pass 120 mL by mouth 2 times daily   vitamin B-12 1000 MCG tablet Commonly known as:  CYANOCOBALAMIN Take 1,000 mcg by mouth daily.   vitamin C 500 MG tablet Commonly known as:  ASCORBIC ACID Take 500 mg by mouth daily.       Immunizations: Immunization History  Administered Date(s) Administered  . PPD Test 09/29/2016     Physical Exam:  Vitals:   10/03/16 1050  BP: 132/70  Pulse: 72  Resp: 18  Temp: 97.5 F (36.4 C)  TempSrc: Oral  SpO2: 96%  Weight: 126 lb 3.2 oz (57.2 kg)  Height:  (1.651 m)   Body mass index is 21 kg/m.  General- elderly female, frail and thin built, in no acute distress Head- normocephalic, atraumatic, has hearing aid Nose- no nasal discharge Throat- moist mucus membrane Eyes- PERRLA, EOMI, no pallor, no icterus, no discharge, normal conjunctiva, normal sclera Neck- no cervical lymphadenopathy Cardiovascular- normal s1,s2, no murmur Respiratory- bilateral clear to auscultation, no wheeze, no rhonchi, no crackles, no use of accessory muscles Abdomen- bowel sounds present, soft, non tender, no guarding or rigidity Musculoskeletal- right arm in soft cast, can move her fingers, good capillary refill, able to move other extremities, arthritis changes noted Neurological- nods her heads at times, tracks you with her eyes sometimes, unable to assess otherwise Skin- warm and dry    Labs reviewed: Basic Metabolic Panel:  Recent Labs  11/91/47 0545 09/27/16 0538 09/28/16 0515  NA 140 135 137  K 3.5 3.5 3.8  CL 107 101 102  CO2 GLUCOSE 128* 235* 239*  BUN CREATININE 0.77 1.09* 1.05*  CALCIUM 8.3* 8.3* 8.4*  MG 2.1 1.3*  1.2* 1.7  PHOS  --  3.3  --    Liver Function Tests: No results for input(s):  AST, ALT, ALKPHOS, BILITOT, PROT, ALBUMIN in the last 8760 hours.  Recent Labs  09/25/16 2158  LIPASE 18   No results for input(s): AMMONIA in the last 8760 hours. CBC:  Recent Labs  09/25/16 2107  09/26/16 0545 09/27/16 0538 09/28/16 0515  WBC 7.5  --  7.0 7.0 6.3  NEUTROABS 5.6  --   --  4.4  --   HGB 8.7*  < > 10.0* 9.5* 9.8*  HCT 27.6*  < > 32.2* 30.2* 31.2*  MCV 81.9  --  82.4 81.4 81.5  PLT 203  --  238 208 203  < > =  values in this interval not displayed. Cardiac Enzymes:  Recent Labs  09/25/16 1527  CKTOTAL 199   BNP: Invalid input(s): POCBNP CBG:  Recent Labs  09/27/16 2124 09/28/16 0916 09/28/16 1150  GLUCAP 190* 230* 229*    Radiological Exams: Dg Lumbar Spine Complete  Result Date: 09/25/2016 CLINICAL DATA:  Status post unwitnessed fall at home. EXAM: LUMBAR SPINE - COMPLETE 4+ VIEW COMPARISON:  Lumbar spine series of November 28, 2011 FINDINGS: The patient has undergone previous kyphoplasty at L1 and L3. Partial compressions at these levels are stable. There is partial compression of L4 which is not being treated. This is also stable. L2 and L5 exhibit normal vertebral body height. There is no spondylolisthesis. The pedicles of L2 and L4 and L5 are grossly normal where visualized. The observed portions of the sacrum exhibit no acute abnormalities. There is gentle curvature of the lumbar spine convex toward the right. IMPRESSION: Chronic changes within the lumbar spine. No acute fracture or dislocation. Electronically Signed   By: David  [place] M.D.   On: 09/25/2016 15:18   Dg Pelvis 1-2 Views  Result Date: 09/25/2016 CLINICAL DATA:  Status post fall. Patient reports low back and tail bone pain EXAM: PELVIS - 1-2 VIEW COMPARISON:  Limited views of the pelvis for all lumbar spine series of March 11, 2011. FINDINGS: The bony pelvis is subjectively adequately mineralized the iliac bones appear intact. The observed portions of the sacrum and SI joints exhibit  no acute abnormalities. There is a stable sclerotic focus in inferior aspect of the left iliac bone. Old fractures of the pubic rami bilaterally are visible. No acute pubic ramus fracture is observed. There is mild narrowing of the right hip joint space medially. The left hip joint space is reasonably well maintained. The femoral heads, necks, intertrochanteric, and immediate sub trochanteric regions appear normal. IMPRESSION: Chronic deformity of the pubic rami consistent with previous fractures. No acute pelvic fracture is observed. The observed portions of the hips reveal no acute abnormalities. Electronically Signed   By: David  [place] M.D.   On: 09/25/2016 15:16   Dg Shoulder Right  Result Date: 09/25/2016 CLINICAL DATA:  Status post fall.  Right shoulder pain. EXAM: RIGHT SHOULDER - 2+ VIEW COMPARISON:  None. FINDINGS: There is no evidence of fracture or dislocation. There is generalized osteopenia. The Soft tissues are unremarkable. IMPRESSION: No acute osseous injury of the right shoulder. Electronically Signed   By: Elige Ko   On: 09/25/2016 15:15   Dg Wrist Complete Right  Result Date: 09/25/2016 CLINICAL DATA:  Status post fall.  Erythematous swollen wrist. EXAM: RIGHT WRIST - COMPLETE 3+ VIEW COMPARISON:  None in PACs FINDINGS: The bones are subjectively osteopenic. There is a impacted nondisplaced fracture of the distal radial metaphysis. There is an ulnar styloid fracture. The carpal bones are grossly intact. There is mild narrowing of the radiocarpal joint. There is mild degenerative change of the first [facility] joint. There is calcification of the triangular fibrocartilage. The metacarpals appear intact. IMPRESSION: Nondisplaced impacted fracture of the distal right radial metaphysis. Minimally distracted ulnar styloid fracture. Electronically Signed   By: David  [place] M.D.   On: 09/25/2016 15:11   Ct Head Wo Contrast  Result Date: 09/25/2016 CLINICAL DATA:  Recent fall EXAM: CT HEAD  WITHOUT CONTRAST CT CERVICAL SPINE WITHOUT CONTRAST TECHNIQUE: Multidetector CT imaging of the head and cervical spine was performed following the standard protocol without intravenous contrast. Multiplanar CT image reconstructions of the cervical spine were also  generated. COMPARISON:  12/16/2012 FINDINGS: CT HEAD FINDINGS Brain: Mild atrophic changes and chronic white matter ischemic change is seen. No findings to suggest acute hemorrhage, acute infarction or space-occupying mass lesion are noted. Vascular: No hyperdense vessel or unexpected calcification. Skull: Postsurgical changes are noted in the mastoid air cells on the right. A cochlear implantation device is seen on the right. No acute fracture is noted. Sinuses/Orbits: No acute finding. Other: None. CT CERVICAL SPINE FINDINGS Alignment: Within normal limits Skull base and vertebrae: 7 cervical vertebra are well visualized. Vertebral body height is well maintained. Osteophytic changes are noted at multiple levels as well as in the upper thoracic levels. Mild facet hypertrophic changes are seen. No findings to suggest acute fracture or acute facet abnormality are noted. Soft tissues and spinal canal: Heavy carotid calcifications are noted bilaterally with changes suggestive of high-grade stenosis. The need for further evaluation can be determined on a clinical basis. Disc levels:  Disc space narrowing is noted at C5-6. Upper chest: Within normal limits. IMPRESSION: CT of the head: Chronic changes without acute abnormality. CT of the cervical spine: Degenerative change without acute abnormality. Electronically Signed   By: Alcide Clever M.D.   On: 09/25/2016 14:51   Ct Cervical Spine Wo Contrast  Result Date: 09/25/2016 CLINICAL DATA:  Recent fall EXAM: CT HEAD WITHOUT CONTRAST CT CERVICAL SPINE WITHOUT CONTRAST TECHNIQUE: Multidetector CT imaging of the head and cervical spine was performed following the standard protocol without intravenous contrast.  Multiplanar CT image reconstructions of the cervical spine were also generated. COMPARISON:  12/16/2012 FINDINGS: CT HEAD FINDINGS Brain: Mild atrophic changes and chronic white matter ischemic change is seen. No findings to suggest acute hemorrhage, acute infarction or space-occupying mass lesion are noted. Vascular: No hyperdense vessel or unexpected calcification. Skull: Postsurgical changes are noted in the mastoid air cells on the right. A cochlear implantation device is seen on the right. No acute fracture is noted. Sinuses/Orbits: No acute finding. Other: None. CT CERVICAL SPINE FINDINGS Alignment: Within normal limits Skull base and vertebrae: 7 cervical vertebra are well visualized. Vertebral body height is well maintained. Osteophytic changes are noted at multiple levels as well as in the upper thoracic levels. Mild facet hypertrophic changes are seen. No findings to suggest acute fracture or acute facet abnormality are noted. Soft tissues and spinal canal: Heavy carotid calcifications are noted bilaterally with changes suggestive of high-grade stenosis. The need for further evaluation can be determined on a clinical basis. Disc levels:  Disc space narrowing is noted at C5-6. Upper chest: Within normal limits. IMPRESSION: CT of the head: Chronic changes without acute abnormality. CT of the cervical spine: Degenerative change without acute abnormality. Electronically Signed   By: Alcide Clever M.D.   On: 09/25/2016 14:51   Ct Abdomen Pelvis W Contrast  Result Date: 09/26/2016 CLINICAL DATA:  Status post fall, with lower back pain. Concern for abdominal injury. Initial encounter. EXAM: CT ABDOMEN AND PELVIS WITH CONTRAST TECHNIQUE: Multidetector CT imaging of the abdomen and pelvis was performed using the standard protocol following bolus administration of intravenous contrast. CONTRAST:  ISOVUE-300 IOPAMIDOL (ISOVUE-300) INJECTION 61% COMPARISON:  Left hip CT performed 12/2002, and abdominal  ultrasound performed 11/21/2010 FINDINGS: Lower chest: Mild bibasilar atelectasis is noted. Scattered coronary artery calcifications are seen. Hepatobiliary: The liver is unremarkable in appearance. The gallbladder is unremarkable in appearance. The common bile duct remains normal in caliber. Pancreas: The pancreas is within normal limits. Spleen: The spleen is unremarkable in appearance. Adrenals/Urinary  Tract: The adrenal glands are unremarkable in appearance. The kidneys are within normal limits. There is no evidence of hydronephrosis. No renal or ureteral stones are identified. No perinephric stranding is seen. Stomach/Bowel: The stomach is unremarkable in appearance. The small bowel is within normal limits. The appendix is not visualized; there is no evidence for appendicitis. The colon is unremarkable in appearance. Vascular/Lymphatic: Diffuse calcification is seen along the abdominal aorta and its branches. There is mild luminal narrowing at the distal abdominal aorta. The inferior vena cava is grossly unremarkable. No retroperitoneal lymphadenopathy is seen. No pelvic sidewall lymphadenopathy is identified. Reproductive: The bladder is mildly distended and grossly unremarkable. The patient is status post hysterectomy. The ovaries are relatively symmetric. No suspicious adnexal masses are seen. Other: No additional soft tissue abnormalities are seen. Musculoskeletal: No acute osseous abnormalities are identified. The patient is status post vertebroplasty at L1 and L3, and chronic compression deformity is also noted at L4. Chronic bilateral superior and inferior rami deformities are noted. The visualized musculature is unremarkable in appearance. IMPRESSION: 1. No evidence of traumatic injury to the abdomen or pelvis. 2. Chronic compression deformity at L4. 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 3. Diffuse aortic atherosclerosis. Mild luminal narrowing at the distal abdominal aorta. 4. Mild bibasilar  atelectasis. 5. Scattered coronary artery calcifications seen. Electronically Signed   By: Roanna Raider M.D.   On: 09/26/2016 00:54    Assessment/Plan  Generalized weakness Will have her work with physical therapy and occupational therapy team to help with gait training and muscle strengthening exercises.fall precautions. Skin care. Encourage to be out of bed. Get palliative care consult to review goals of care with her advanced dementia  Right wrist nondisplaced fracture Seen by orthopedic and has a soft cast in place. Will need orthopedic follow up in 1 week. Will have patient work with PT/OT as tolerated to regain strength and restore function.  Fall precautions are in place. Sling to RUE for now, NWB to right wrist for now until seen by orthopedic.   Acute renal failure Had AKI this admission and received iv fluids. On lab review, GFR remains in 50s. On medication review, she is on ibuprofen and metformin. D/c them with renal impairment as below. Check bmp   Anemia unspecified Monitor cbc  Severe protein calorie malnutrition On remeron to help stimulate appetite. RD consult. Monitor po intake. Check weekly weight  DM type 2 Continue SSI novlog for now. D/c metformin given her impaired renal function and increased risk for hypoglycemia with poor po intake. Continue aspirin and lovastatin. Monitor cbg with episodes of hypoglycemia qac and qhs for now. Lab Results  Component Value Date   HGBA1C 6.8 (H) 09/26/2016    Dementia with behavior disturbance Advanced. Currently on depakote 125 mg bid and additional 125 mg at bedtime. Given her dementia and increased sleepiness, decrease depakote to bid for now. Continue donepezil and provide supportive care. Will need full assist with feeding. Aspiration precautions and SLP to evaluate. Continue seroquel 50 mg qhs. Consider decreasing seroquel if patient has increased daytime somnolence.  Hypothyroidism Lab Results  Component Value Date    TSH 1.109 12/18/2012   Check tsh, continue levothyroxine 88 mcg daily  Hypokalemia On kcl supplement, monitor bmp  Dysphagia Aspiration precautions, assist with feed, puree diet  Goals of care: short term rehabilitation, possible long term care   Labs/tests ordered: CBC, BMP 10/04/16  Family/ staff Communication: reviewed care plan with patient and nursing supervisor  Blanchie Serve, MD Internal Medicine [facility] [address] [place], [place] [address] Cell Phone (Monday-Friday 8 am - 5 pm): [phone] On Call: [phone] and follow prompts after 5 pm and on weekends Office Phone: [phone] Office Fax: [phone]

## 2016-10-04 LAB — BASIC METABOLIC PANEL
BUN: 24 mg/dL — AB (ref 4–21)
Creatinine: 1 mg/dL (ref 0.5–1.1)
Glucose: 358 mg/dL
Potassium: 4.3 mmol/L (ref 3.4–5.3)
Sodium: 139 mmol/L (ref 137–147)

## 2016-10-04 LAB — CBC AND DIFFERENTIAL
HEMATOCRIT: 35 % — AB (ref 36–46)
Hemoglobin: 10.6 g/dL — AB (ref 12.0–16.0)
Platelets: 249 10*3/uL (ref 150–399)
WBC: 4.9 10^3/mL

## 2016-10-04 LAB — HEPATIC FUNCTION PANEL
ALK PHOS: 98 U/L (ref 25–125)
ALT: 10 U/L (ref 7–35)
AST: 12 U/L — AB (ref 13–35)
BILIRUBIN, TOTAL: 0.6 mg/dL

## 2016-10-05 ENCOUNTER — Non-Acute Institutional Stay (SKILLED_NURSING_FACILITY): Payer: Medicare Other | Admitting: Family

## 2016-10-05 DIAGNOSIS — G894 Chronic pain syndrome: Secondary | ICD-10-CM

## 2016-10-05 DIAGNOSIS — W19XXXA Unspecified fall, initial encounter: Secondary | ICD-10-CM

## 2016-10-05 DIAGNOSIS — F0391 Unspecified dementia with behavioral disturbance: Secondary | ICD-10-CM

## 2016-10-05 DIAGNOSIS — Y92129 Unspecified place in nursing home as the place of occurrence of the external cause: Secondary | ICD-10-CM | POA: Diagnosis not present

## 2016-10-05 NOTE — Progress Notes (Signed)
Location:  [facility] Room Number: 407 Place of Service:  SNF [phone]) Provider: Kyliegh Jester FNP-C  Georgann Housekeeper, MD  Patient Care Team: Georgann Housekeeper, MD as PCP - General (Internal Medicine)  Extended Emergency Contact Information Primary Emergency Contact: Emery,David Address: [address] [place], [place] [address] [place] Home Phone: [phone] Relation: Son Secondary Emergency Contact: Howze,Laura  [place] Mobile Phone: [phone] Relation: Other  Code Status:  DNR  Goals of care: Advanced Directive information Advanced Directives 10/03/2016  Does Patient Have a Medical Advance Directive? Yes  Type of Advance Directive Out of facility DNR (pink MOST or yellow form)  Does patient want to make changes to medical advance directive? No - Patient declined  Would patient like information on creating a medical advance directive? -  Pre-existing out of facility DNR order (yellow form or pink MOST form) -     Chief Complaint  Patient presents with  . Acute Visit    medication evaluation     HPI:  Pt is a 76 y.o. female seen today at [facility] for an acute visit for evaluation of medication per patient's son request. She has a significant medical history of type 2 DM, Hypothyroidism, Dementia, chronic pain,Anemia among other conditions. She is seen in her room today lying on the floor with left side of face on the floor. She does not recall how she fell but states " I sat on the floor". Facility Nurse notified immediately.patient moving extremities without any difficulties and no bruises noted. Patient was assisted back to bed by Nursing staff. She complained of her lower back pain but staff states has had chronicback pain.Patient She is very hard of hearing and pleasantly confused unable to provide detail HPI and ROS. Patient's son called facility Nurse supervisor concerned that  patient was taking Depakote 125 mg once daily at home but now on three times daily.Phone call returned to patient's son Anastacia Reinecke  at [phone] Son states patient was unable to wake up when he came to visit on previous day.Patient recently seen in the facility by MD and Depakote was reduced to twice daily. Patient awake and attempted to get out of bed without any assistance.    Past Medical History:  Diagnosis Date  . Dementia   . Diabetes mellitus   . Hypertension    Past Surgical History:  Procedure Laterality Date  . BACK SURGERY    . INNER EAR SURGERY    . NOSE SURGERY      Allergies  Allergen Reactions  . Bactrim Nausea And Vomiting    Allergies as of 10/05/2016      Reactions   Bactrim Nausea And Vomiting      Medication List       Accurate as of 10/05/16  4:18 PM. Always use your most recent med list.          aspirin EC 81 MG tablet Take 81 mg by mouth daily.   Calcium-Vitamin D 600-200 MG-UNIT Caps Take 1 tablet by mouth daily.   cholecalciferol 1000 units tablet Commonly known as:  VITAMIN D Take 1,000 Units by mouth daily.   divalproex 125 MG capsule Commonly known as:  DEPAKOTE SPRINKLE Take 1 capsule (125 mg total) by mouth every 12 (twelve) hours.   donepezil 10 MG tablet Commonly known as:  ARICEPT Take 10 mg by mouth at bedtime.  fish oil-omega-3 fatty acids 1000 MG capsule Take 1 g by mouth daily.   ibuprofen 200 MG tablet Commonly known as:  ADVIL,MOTRIN Take 600 mg by mouth every 6 (six) hours as needed. pain   insulin aspart 100 UNIT/ML injection Commonly known as:  novoLOG Inject 0-10 Units into the skin 3 (three) times daily as needed for high blood sugar.   levothyroxine 88 MCG tablet Commonly known as:  SYNTHROID, LEVOTHROID Take 88 mcg by mouth daily before breakfast.   lovastatin 20 MG tablet Commonly known as:  MEVACOR Take 20 mg by mouth at bedtime.   magnesium chloride 64 MG Tbec SR tablet Commonly known as:   SLOW-MAG Take 2 tablets (128 mg total) by mouth daily.   metFORMIN 500 MG tablet Commonly known as:  GLUCOPHAGE Take 1 tablet (500 mg total) by mouth 2 (two) times daily with a meal.   mirtazapine 7.5 MG tablet Commonly known as:  REMERON Take 7.5 mg by mouth at bedtime.   multivitamin with minerals Tabs tablet Take 1 tablet by mouth daily.   potassium chloride 10 MEQ tablet Commonly known as:  K-DUR,KLOR-CON Take 1 tablet (10 mEq total) by mouth 2 (two) times daily.   QUEtiapine 50 MG tablet Commonly known as:  SEROQUEL Take 1 tablet (50 mg total) by mouth at bedtime.   UNABLE TO FIND Med Name: Med pass 120 mL by mouth 2 times daily   vitamin B-12 1000 MCG tablet Commonly known as:  CYANOCOBALAMIN Take 1,000 mcg by mouth daily.   vitamin C 500 MG tablet Commonly known as:  ASCORBIC ACID Take 500 mg by mouth daily.       Review of Systems  Unable to perform ROS: Dementia    Immunization History  Administered Date(s) Administered  . PPD Test 09/29/2016   Pertinent  Health Maintenance Due  Topic Date Due  . FOOT EXAM  11/21/1950  . OPHTHALMOLOGY EXAM  11/21/1950  . URINE MICROALBUMIN  11/21/1950  . PNA vac Low Risk Adult (1 of 2 - PCV13) 11/21/2004  . INFLUENZA VACCINE  01/09/2017  . HEMOGLOBIN A1C  03/28/2017  . DEXA SCAN  Completed    Vitals:   10/05/16 1607  BP: 130/78  Pulse: 74  Resp: 16  Temp: 97.8 F (36.6 C)  SpO2: 94%  Weight: 126 lb 3.2 oz (57.2 kg)  Height:  (1.651 m)   Body mass index is 21 kg/m. Physical Exam  Constitutional:  Thin elderly pleasantly confused at baseline. Very Hard of hearing   HENT:  Head: Normocephalic.  Mouth/Throat: Oropharynx is clear and moist. No oropharyngeal exudate.  HOH   Eyes: Conjunctivae and EOM are normal. Pupils are equal, round, and reactive to light. Right eye exhibits no discharge. Left eye exhibits no discharge. No scleral icterus.  Neck: Normal range of motion. No JVD present. No  thyromegaly present.  Cardiovascular: Normal rate, regular rhythm, normal heart sounds and intact distal pulses.  Exam reveals no gallop and no friction rub.   No murmur heard. Pulmonary/Chest: Effort normal and breath sounds normal. No respiratory distress. She has no wheezes. She has no rales.  Abdominal: Soft. Bowel sounds are normal. She exhibits no distension. There is no tenderness. There is no rebound and no guarding.  Musculoskeletal: She exhibits no edema, tenderness or deformity.  Moves x 4 extremities without any difficulties except right arm cast in place.   Lymphadenopathy:    She has no cervical adenopathy.  Neurological: She is alert.  Pleasantly confused at baseline  Skin: Skin is warm and dry. No rash noted. No erythema. No pallor.  Psychiatric: She has a normal mood and affect.    Labs reviewed:  Recent Labs  09/26/16 0545 09/27/16 0538 09/28/16 0515  NA 140 135 137  K 3.5 3.5 3.8  CL 107 101 102  CO2 GLUCOSE 128* 235* 239*  BUN CREATININE 0.77 1.09* 1.05*  CALCIUM 8.3* 8.3* 8.4*  MG 2.1 1.3*  1.2* 1.7  PHOS  --  3.3  --     Recent Labs  09/25/16 2107  09/26/16 0545 09/27/16 0538 09/28/16 0515  WBC 7.5  --  7.0 7.0 6.3  NEUTROABS 5.6  --   --  4.4  --   HGB 8.7*  < > 10.0* 9.5* 9.8*  HCT 27.6*  < > 32.2* 30.2* 31.2*  MCV 81.9  --  82.4 81.4 81.5  PLT 203  --  238 208 203  < > = values in this interval not displayed. Lab Results  Component Value Date   TSH 1.109 12/18/2012   Lab Results  Component Value Date   HGBA1C 6.8 (H) 09/26/2016   Lab Results  Component Value Date   CHOL 200 09/26/2016   HDL 42 09/26/2016   LDLCALC 121 (H) 09/26/2016   TRIG 187 (H) 09/26/2016   CHOLHDL 4.8 09/26/2016   Significant Diagnostic Results in last 30 days:  Dg Lumbar Spine Complete  Result Date: 09/25/2016 CLINICAL DATA:  Status post unwitnessed fall at home. EXAM: LUMBAR SPINE - COMPLETE 4+ VIEW COMPARISON:  Lumbar spine series  of November 28, 2011 FINDINGS: The patient has undergone previous kyphoplasty at L1 and L3. Partial compressions at these levels are stable. There is partial compression of L4 which is not being treated. This is also stable. L2 and L5 exhibit normal vertebral body height. There is no spondylolisthesis. The pedicles of L2 and L4 and L5 are grossly normal where visualized. The observed portions of the sacrum exhibit no acute abnormalities. There is gentle curvature of the lumbar spine convex toward the right. IMPRESSION: Chronic changes within the lumbar spine. No acute fracture or dislocation. Electronically Signed   By: David  [place] M.D.   On: 09/25/2016 15:18   Dg Pelvis 1-2 Views  Result Date: 09/25/2016 CLINICAL DATA:  Status post fall. Patient reports low back and tail bone pain EXAM: PELVIS - 1-2 VIEW COMPARISON:  Limited views of the pelvis for all lumbar spine series of March 11, 2011. FINDINGS: The bony pelvis is subjectively adequately mineralized the iliac bones appear intact. The observed portions of the sacrum and SI joints exhibit no acute abnormalities. There is a stable sclerotic focus in inferior aspect of the left iliac bone. Old fractures of the pubic rami bilaterally are visible. No acute pubic ramus fracture is observed. There is mild narrowing of the right hip joint space medially. The left hip joint space is reasonably well maintained. The femoral heads, necks, intertrochanteric, and immediate sub trochanteric regions appear normal. IMPRESSION: Chronic deformity of the pubic rami consistent with previous fractures. No acute pelvic fracture is observed. The observed portions of the hips reveal no acute abnormalities. Electronically Signed   By: David  [place] M.D.   On: 09/25/2016 15:16   Dg Shoulder Right  Result Date: 09/25/2016 CLINICAL DATA:  Status post fall.  Right shoulder pain. EXAM: RIGHT SHOULDER - 2+ VIEW COMPARISON:  None. FINDINGS: There is no evidence of fracture  or  dislocation. There is generalized osteopenia. The Soft tissues are unremarkable. IMPRESSION: No acute osseous injury of the right shoulder. Electronically Signed   By: Elige Ko   On: 09/25/2016 15:15   Dg Wrist Complete Right  Result Date: 09/25/2016 CLINICAL DATA:  Status post fall.  Erythematous swollen wrist. EXAM: RIGHT WRIST - COMPLETE 3+ VIEW COMPARISON:  None in PACs FINDINGS: The bones are subjectively osteopenic. There is a impacted nondisplaced fracture of the distal radial metaphysis. There is an ulnar styloid fracture. The carpal bones are grossly intact. There is mild narrowing of the radiocarpal joint. There is mild degenerative change of the first [facility] joint. There is calcification of the triangular fibrocartilage. The metacarpals appear intact. IMPRESSION: Nondisplaced impacted fracture of the distal right radial metaphysis. Minimally distracted ulnar styloid fracture. Electronically Signed   By: David  [place] M.D.   On: 09/25/2016 15:11   Ct Head Wo Contrast  Result Date: 09/25/2016 CLINICAL DATA:  Recent fall EXAM: CT HEAD WITHOUT CONTRAST CT CERVICAL SPINE WITHOUT CONTRAST TECHNIQUE: Multidetector CT imaging of the head and cervical spine was performed following the standard protocol without intravenous contrast. Multiplanar CT image reconstructions of the cervical spine were also generated. COMPARISON:  12/16/2012 FINDINGS: CT HEAD FINDINGS Brain: Mild atrophic changes and chronic white matter ischemic change is seen. No findings to suggest acute hemorrhage, acute infarction or space-occupying mass lesion are noted. Vascular: No hyperdense vessel or unexpected calcification. Skull: Postsurgical changes are noted in the mastoid air cells on the right. A cochlear implantation device is seen on the right. No acute fracture is noted. Sinuses/Orbits: No acute finding. Other: None. CT CERVICAL SPINE FINDINGS Alignment: Within normal limits Skull base and vertebrae: 7 cervical vertebra are  well visualized. Vertebral body height is well maintained. Osteophytic changes are noted at multiple levels as well as in the upper thoracic levels. Mild facet hypertrophic changes are seen. No findings to suggest acute fracture or acute facet abnormality are noted. Soft tissues and spinal canal: Heavy carotid calcifications are noted bilaterally with changes suggestive of high-grade stenosis. The need for further evaluation can be determined on a clinical basis. Disc levels:  Disc space narrowing is noted at C5-6. Upper chest: Within normal limits. IMPRESSION: CT of the head: Chronic changes without acute abnormality. CT of the cervical spine: Degenerative change without acute abnormality. Electronically Signed   By: Alcide Clever M.D.   On: 09/25/2016 14:51   Ct Cervical Spine Wo Contrast  Result Date: 09/25/2016 CLINICAL DATA:  Recent fall EXAM: CT HEAD WITHOUT CONTRAST CT CERVICAL SPINE WITHOUT CONTRAST TECHNIQUE: Multidetector CT imaging of the head and cervical spine was performed following the standard protocol without intravenous contrast. Multiplanar CT image reconstructions of the cervical spine were also generated. COMPARISON:  12/16/2012 FINDINGS: CT HEAD FINDINGS Brain: Mild atrophic changes and chronic white matter ischemic change is seen. No findings to suggest acute hemorrhage, acute infarction or space-occupying mass lesion are noted. Vascular: No hyperdense vessel or unexpected calcification. Skull: Postsurgical changes are noted in the mastoid air cells on the right. A cochlear implantation device is seen on the right. No acute fracture is noted. Sinuses/Orbits: No acute finding. Other: None. CT CERVICAL SPINE FINDINGS Alignment: Within normal limits Skull base and vertebrae: 7 cervical vertebra are well visualized. Vertebral body height is well maintained. Osteophytic changes are noted at multiple levels as well as in the upper thoracic levels. Mild facet hypertrophic changes are seen. No  findings to suggest acute fracture or acute  facet abnormality are noted. Soft tissues and spinal canal: Heavy carotid calcifications are noted bilaterally with changes suggestive of high-grade stenosis. The need for further evaluation can be determined on a clinical basis. Disc levels:  Disc space narrowing is noted at C5-6. Upper chest: Within normal limits. IMPRESSION: CT of the head: Chronic changes without acute abnormality. CT of the cervical spine: Degenerative change without acute abnormality. Electronically Signed   By: Alcide Clever M.D.   On: 09/25/2016 14:51   Ct Abdomen Pelvis W Contrast  Result Date: 09/26/2016 CLINICAL DATA:  Status post fall, with lower back pain. Concern for abdominal injury. Initial encounter. EXAM: CT ABDOMEN AND PELVIS WITH CONTRAST TECHNIQUE: Multidetector CT imaging of the abdomen and pelvis was performed using the standard protocol following bolus administration of intravenous contrast. CONTRAST:  ISOVUE-300 IOPAMIDOL (ISOVUE-300) INJECTION 61% COMPARISON:  Left hip CT performed 12/2002, and abdominal ultrasound performed 11/21/2010 FINDINGS: Lower chest: Mild bibasilar atelectasis is noted. Scattered coronary artery calcifications are seen. Hepatobiliary: The liver is unremarkable in appearance. The gallbladder is unremarkable in appearance. The common bile duct remains normal in caliber. Pancreas: The pancreas is within normal limits. Spleen: The spleen is unremarkable in appearance. Adrenals/Urinary Tract: The adrenal glands are unremarkable in appearance. The kidneys are within normal limits. There is no evidence of hydronephrosis. No renal or ureteral stones are identified. No perinephric stranding is seen. Stomach/Bowel: The stomach is unremarkable in appearance. The small bowel is within normal limits. The appendix is not visualized; there is no evidence for appendicitis. The colon is unremarkable in appearance. Vascular/Lymphatic: Diffuse calcification is seen  along the abdominal aorta and its branches. There is mild luminal narrowing at the distal abdominal aorta. The inferior vena cava is grossly unremarkable. No retroperitoneal lymphadenopathy is seen. No pelvic sidewall lymphadenopathy is identified. Reproductive: The bladder is mildly distended and grossly unremarkable. The patient is status post hysterectomy. The ovaries are relatively symmetric. No suspicious adnexal masses are seen. Other: No additional soft tissue abnormalities are seen. Musculoskeletal: No acute osseous abnormalities are identified. The patient is status post vertebroplasty at L1 and L3, and chronic compression deformity is also noted at L4. Chronic bilateral superior and inferior rami deformities are noted. The visualized musculature is unremarkable in appearance. IMPRESSION: 1. No evidence of traumatic injury to the abdomen or pelvis. 2. Chronic compression deformity at L4. 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 3. Diffuse aortic atherosclerosis. Mild luminal narrowing at the distal abdominal aorta. 4. Mild bibasilar atelectasis. 5. Scattered coronary artery calcifications seen. Electronically Signed   By: Roanna Raider M.D.   On: 09/26/2016 00:54    Assessment/Plan 1. Dementia with behavioral disturbance Depakote recently reduced to twice daily. Will continue with current dose for now. Monitor for increased daytime sleepiness then changed Depakote to once daily.   2. Chronic pain syndrome Lower back pain.will get urine specimen for U/A and C/s rule out UTI. PMR consult for pain management.   3. Fall at nursing home Status post unwitnessed fall. No acute injuries. Continue to monitor. Will obtain Urine specimen as above.   Family/ staff Communication: Reviewed plan of care with patient and facility Nurse supervisor  Labs/tests ordered: None   Caesar Bookman, NP

## 2016-10-09 DIAGNOSIS — R531 Weakness: Secondary | ICD-10-CM | POA: Diagnosis not present

## 2016-10-10 DIAGNOSIS — G894 Chronic pain syndrome: Secondary | ICD-10-CM | POA: Diagnosis not present

## 2016-10-10 DIAGNOSIS — M25531 Pain in right wrist: Secondary | ICD-10-CM | POA: Diagnosis not present

## 2016-10-10 DIAGNOSIS — M545 Low back pain: Secondary | ICD-10-CM | POA: Diagnosis not present

## 2016-10-10 DIAGNOSIS — Z9181 History of falling: Secondary | ICD-10-CM | POA: Diagnosis not present

## 2016-10-12 DIAGNOSIS — Z9181 History of falling: Secondary | ICD-10-CM | POA: Diagnosis not present

## 2016-10-12 DIAGNOSIS — M79601 Pain in right arm: Secondary | ICD-10-CM | POA: Diagnosis not present

## 2016-10-17 LAB — CBC AND DIFFERENTIAL
HEMATOCRIT: 36 % (ref 36–46)
Hemoglobin: 11.3 g/dL — AB (ref 12.0–16.0)
PLATELETS: 128 10*3/uL — AB (ref 150–399)
WBC: 5.3 10^3/mL

## 2016-10-22 DIAGNOSIS — M79601 Pain in right arm: Secondary | ICD-10-CM | POA: Diagnosis not present

## 2016-10-22 DIAGNOSIS — Z9181 History of falling: Secondary | ICD-10-CM | POA: Diagnosis not present

## 2016-10-29 DIAGNOSIS — R531 Weakness: Secondary | ICD-10-CM | POA: Diagnosis not present

## 2016-10-31 ENCOUNTER — Non-Acute Institutional Stay (SKILLED_NURSING_FACILITY): Payer: Medicare Other | Admitting: Family

## 2016-10-31 ENCOUNTER — Encounter: Payer: Self-pay | Admitting: Family

## 2016-10-31 DIAGNOSIS — E039 Hypothyroidism, unspecified: Secondary | ICD-10-CM | POA: Diagnosis not present

## 2016-10-31 DIAGNOSIS — E782 Mixed hyperlipidemia: Secondary | ICD-10-CM | POA: Diagnosis not present

## 2016-10-31 DIAGNOSIS — Z794 Long term (current) use of insulin: Secondary | ICD-10-CM

## 2016-10-31 DIAGNOSIS — E1100 Type 2 diabetes mellitus with hyperosmolarity without nonketotic hyperglycemic-hyperosmolar coma (NKHHC): Secondary | ICD-10-CM

## 2016-11-01 ENCOUNTER — Encounter (HOSPITAL_COMMUNITY): Payer: Self-pay

## 2016-11-01 ENCOUNTER — Emergency Department (HOSPITAL_COMMUNITY)
Admission: EM | Admit: 2016-11-01 | Discharge: 2016-11-01 | Disposition: A | Discharge status: Home / Self Care | Payer: Medicare Other | Attending: Emergency Medicine | Admitting: Emergency Medicine

## 2016-11-01 ENCOUNTER — Emergency Department (HOSPITAL_COMMUNITY): Payer: Medicare Other

## 2016-11-01 DIAGNOSIS — E039 Hypothyroidism, unspecified: Secondary | ICD-10-CM | POA: Diagnosis not present

## 2016-11-01 DIAGNOSIS — S098XXA Other specified injuries of head, initial encounter: Secondary | ICD-10-CM | POA: Diagnosis not present

## 2016-11-01 DIAGNOSIS — Z7982 Long term (current) use of aspirin: Secondary | ICD-10-CM | POA: Diagnosis not present

## 2016-11-01 DIAGNOSIS — Y999 Unspecified external cause status: Secondary | ICD-10-CM | POA: Insufficient documentation

## 2016-11-01 DIAGNOSIS — S199XXA Unspecified injury of neck, initial encounter: Secondary | ICD-10-CM | POA: Diagnosis not present

## 2016-11-01 DIAGNOSIS — M545 Low back pain, unspecified: Secondary | ICD-10-CM

## 2016-11-01 DIAGNOSIS — Z794 Long term (current) use of insulin: Secondary | ICD-10-CM | POA: Diagnosis not present

## 2016-11-01 DIAGNOSIS — I10 Essential (primary) hypertension: Secondary | ICD-10-CM | POA: Insufficient documentation

## 2016-11-01 DIAGNOSIS — S0083XA Contusion of other part of head, initial encounter: Secondary | ICD-10-CM | POA: Diagnosis not present

## 2016-11-01 DIAGNOSIS — Z79899 Other long term (current) drug therapy: Secondary | ICD-10-CM | POA: Diagnosis not present

## 2016-11-01 DIAGNOSIS — E1143 Type 2 diabetes mellitus with diabetic autonomic (poly)neuropathy: Secondary | ICD-10-CM | POA: Insufficient documentation

## 2016-11-01 DIAGNOSIS — K3184 Gastroparesis: Secondary | ICD-10-CM | POA: Insufficient documentation

## 2016-11-01 DIAGNOSIS — E1110 Type 2 diabetes mellitus with ketoacidosis without coma: Secondary | ICD-10-CM | POA: Diagnosis not present

## 2016-11-01 DIAGNOSIS — S0101XA Laceration without foreign body of scalp, initial encounter: Secondary | ICD-10-CM | POA: Diagnosis not present

## 2016-11-01 DIAGNOSIS — Y92121 Bathroom in nursing home as the place of occurrence of the external cause: Secondary | ICD-10-CM | POA: Insufficient documentation

## 2016-11-01 DIAGNOSIS — Y9389 Activity, other specified: Secondary | ICD-10-CM | POA: Insufficient documentation

## 2016-11-01 DIAGNOSIS — Z23 Encounter for immunization: Secondary | ICD-10-CM | POA: Diagnosis not present

## 2016-11-01 DIAGNOSIS — W19XXXA Unspecified fall, initial encounter: Secondary | ICD-10-CM

## 2016-11-01 DIAGNOSIS — W182XXA Fall in (into) shower or empty bathtub, initial encounter: Secondary | ICD-10-CM | POA: Insufficient documentation

## 2016-11-01 DIAGNOSIS — S0990XA Unspecified injury of head, initial encounter: Secondary | ICD-10-CM | POA: Diagnosis not present

## 2016-11-01 LAB — CBC
HEMATOCRIT: 37.8 % (ref 36.0–46.0)
Hemoglobin: 11.6 g/dL — ABNORMAL LOW (ref 12.0–15.0)
MCH: 26 pg (ref 26.0–34.0)
MCHC: 30.7 g/dL (ref 30.0–36.0)
MCV: 84.8 fL (ref 78.0–100.0)
PLATELETS: 139 10*3/uL — AB (ref 150–400)
RBC: 4.46 MIL/uL (ref 3.87–5.11)
RDW: 17.7 % — AB (ref 11.5–15.5)
WBC: 12.1 10*3/uL — ABNORMAL HIGH (ref 4.0–10.5)

## 2016-11-01 LAB — BASIC METABOLIC PANEL
Anion gap: 14 (ref 5–15)
BUN: 20 mg/dL (ref 6–20)
CHLORIDE: 98 mmol/L — AB (ref 101–111)
CO2: 24 mmol/L (ref 22–32)
CREATININE: 1.16 mg/dL — AB (ref 0.44–1.00)
Calcium: 9.4 mg/dL (ref 8.9–10.3)
GFR calc Af Amer: 52 mL/min — ABNORMAL LOW (ref >60)
GFR calc non Af Amer: 45 mL/min — ABNORMAL LOW (ref >60)
GLUCOSE: 255 mg/dL — AB (ref 65–99)
POTASSIUM: 4.3 mmol/L (ref 3.5–5.1)
Sodium: 136 mmol/L (ref 135–145)

## 2016-11-01 LAB — URINALYSIS, ROUTINE W REFLEX MICROSCOPIC
Bilirubin Urine: NEGATIVE
HGB URINE DIPSTICK: NEGATIVE
KETONES UR: NEGATIVE mg/dL
Leukocytes, UA: NEGATIVE
NITRITE: NEGATIVE
Protein, ur: NEGATIVE mg/dL
Specific Gravity, Urine: 1.017 (ref 1.005–1.030)
pH: 6 (ref 5.0–8.0)

## 2016-11-01 LAB — CBG MONITORING, ED: Glucose-Capillary: 235 mg/dL — ABNORMAL HIGH (ref 65–99)

## 2016-11-01 LAB — CK: Total CK: 211 U/L (ref 38–234)

## 2016-11-01 MED ORDER — TETANUS-DIPHTH-ACELL PERTUSSIS 5-2.5-18.5 LF-MCG/0.5 IM SUSP
0.5000 mL | Freq: Once | INTRAMUSCULAR | Status: AC
  Administered 2016-11-01: 0.5 mL via INTRAMUSCULAR
  Filled 2016-11-01: qty 0.5

## 2016-11-01 MED ORDER — LIDOCAINE-EPINEPHRINE (PF) 2 %-1:200000 IJ SOLN
10.0000 mL | Freq: Once | INTRAMUSCULAR | Status: AC
  Administered 2016-11-01: 10 mL
  Filled 2016-11-01: qty 20

## 2016-11-01 MED ORDER — ACETAMINOPHEN 325 MG PO TABS
650.0000 mg | ORAL_TABLET | Freq: Once | ORAL | Status: AC
  Administered 2016-11-01: 650 mg via ORAL
  Filled 2016-11-01: qty 2

## 2016-11-01 MED ORDER — HYDROCODONE-ACETAMINOPHEN 5-325 MG PO TABS: 1.0000 | ORAL_TABLET | Freq: Once | ORAL | Status: DC

## 2016-11-01 NOTE — ED Notes (Signed)
Attempted to access IV x 2

## 2016-11-01 NOTE — Discharge Instructions (Signed)
Lab work and imaging has been normal. Make sure patient is walking with a walker with assistance. 4 staples were placed in her scalp laceration in need to be removed in 5-7 days watch out for signs of infection.

## 2016-11-01 NOTE — ED Notes (Signed)
CBG 235 

## 2016-11-01 NOTE — ED Provider Notes (Signed)
Level V caveat dementia patient had a fall at nursing home earlier today. She suffered laceration to her scalp as result of event. She looks at baseline. Her son who accompanies her. On exam alert in no distress. Neurologic moves all extremities. Pelvis stable nontender. No pain on internal or external rotation of either thigh. All 4 extremities atraumatic, neurovascularly intact   Doug SouJacubowitz, Osmani Kersten, MD 11/01/16 [phone]

## 2016-11-01 NOTE — ED Notes (Signed)
PTAR at the bedside. Pt to be taken back to nursing facility.

## 2016-11-01 NOTE — Progress Notes (Signed)
Location:  [facility] Room Number: [phone] Place of Service:  SNF (31) Provider: Gaetano Romberger FNP-C  Georgann Housekeeper, MD  Patient Care Team: Georgann Housekeeper, MD as PCP - General (Internal Medicine)  Extended Emergency Contact Information Primary Emergency Contact: Reid,David Address: [address] [place], [place] [address] [place] Home Phone: [phone] Relation: Son Secondary Emergency Contact: Gulbranson,Laura  [place] Mobile Phone: [phone] Relation: Other  Code Status:  DNR  Goals of care: Advanced Directive information Advanced Directives 10/31/2016  Does Patient Have a Medical Advance Directive? Yes  Type of Advance Directive Out of facility DNR (pink MOST or yellow form)  Does patient want to make changes to medical advance directive? -  Would patient like information on creating a medical advance directive? -  Pre-existing out of facility DNR order (yellow form or pink MOST form) Yellow form placed in chart (order not valid for inpatient use)     Chief Complaint  Patient presents with  . Medical Management of Chronic Issues    routine visit    HPI:  Pt is a 76 y.o. female seen today at [facility] for Medical management of chronic issues.She has a significant medical history of type 2 DM, Hypothyroidism, Dementia, chronic pain,Anemia among other conditions. She is seen in her room today.She denies any acute issues this visit. She is very hard of hearing and pleasantly confused unable to provide detail HPI and ROS.she has gained 8 pounds over the past one months weight gain beneficial for patient.she has had no recent  fall episode since prior visit.   Past Medical History:  Diagnosis Date  . Dementia   . Diabetes mellitus   . Hypertension    Past Surgical History:  Procedure Laterality Date  . BACK SURGERY    . INNER EAR SURGERY    . NOSE SURGERY      Allergies    Allergen Reactions  . Bactrim Nausea And Vomiting    Allergies as of 10/31/2016      Reactions   Bactrim Nausea And Vomiting      Medication List       Accurate as of 10/31/16 11:59 PM. Always use your most recent med list.          acetaminophen 500 MG tablet Commonly known as:  TYLENOL Take 1,000 mg by mouth 3 (three) times daily.   aspirin EC 81 MG tablet Take 81 mg by mouth daily.   Calcium-Vitamin D 600-200 MG-UNIT Caps Take 1 tablet by mouth daily.   cholecalciferol 1000 units tablet Commonly known as:  VITAMIN D Take 1,000 Units by mouth daily.   divalproex 125 MG capsule Commonly known as:  DEPAKOTE SPRINKLE Take 1 capsule (125 mg total) by mouth every 12 (twelve) hours.   donepezil 10 MG tablet Commonly known as:  ARICEPT Take 10 mg by mouth at bedtime.   fish oil-omega-3 fatty acids 1000 MG capsule Take 1 g by mouth daily.   ibuprofen 200 MG tablet Commonly known as:  ADVIL,MOTRIN Take 600 mg by mouth every 6 (six) hours as needed. pain   insulin aspart 100 UNIT/ML injection Commonly known as:  novoLOG Inject 0-10 Units into the skin 3 (three) times daily as needed for high blood sugar.   insulin glargine 100 UNIT/ML injection Commonly known as:  LANTUS Inject 10 Units into the skin at bedtime.   levothyroxine  88 MCG tablet Commonly known as:  SYNTHROID, LEVOTHROID Take 88 mcg by mouth daily before breakfast.   lovastatin 20 MG tablet Commonly known as:  MEVACOR Take 20 mg by mouth at bedtime.   magnesium chloride 64 MG Tbec SR tablet Commonly known as:  SLOW-MAG Take 2 tablets (128 mg total) by mouth daily.   mirtazapine 7.5 MG tablet Commonly known as:  REMERON Take 7.5 mg by mouth at bedtime.   multivitamin with minerals Tabs tablet Take 1 tablet by mouth daily.   potassium chloride 10 MEQ tablet Commonly known as:  K-DUR,KLOR-CON Take 1 tablet (10 mEq total) by mouth 2 (two) times daily.   QUEtiapine 50 MG tablet Commonly  known as:  SEROQUEL Take 1 tablet (50 mg total) by mouth at bedtime.   UNABLE TO FIND Med Name: Med pass 120 mL by mouth 2 times daily   vitamin B-12 1000 MCG tablet Commonly known as:  CYANOCOBALAMIN Take 1,000 mcg by mouth daily.   vitamin C 500 MG tablet Commonly known as:  ASCORBIC ACID Take 500 mg by mouth daily.       Review of Systems  Unable to perform ROS: Dementia    Immunization History  Administered Date(s) Administered  . PPD Test 09/29/2016, 10/12/2016   Pertinent  Health Maintenance Due  Topic Date Due  . FOOT EXAM  11/21/1950  . OPHTHALMOLOGY EXAM  11/21/1950  . URINE MICROALBUMIN  11/21/1950  . PNA vac Low Risk Adult (1 of 2 - PCV13) 11/21/2004  . INFLUENZA VACCINE  01/09/2017  . HEMOGLOBIN A1C  03/28/2017  . DEXA SCAN  Completed    Vitals:   10/31/16 1429  BP: 131/76  Pulse: 74  Resp: 18  Temp: 97.1 F (36.2 C)  SpO2: 91%  Weight: 126 lb (57.2 kg)  Height: 5\' 5"  (1.651 m)   Body mass index is 20.97 kg/m. Physical Exam  Constitutional:  Thin frail elderly pleasantly confused at baseline   HENT:  Head: Normocephalic.  Mouth/Throat: Oropharynx is clear and moist. No oropharyngeal exudate.  Very HOH   Eyes: Conjunctivae and EOM are normal. Pupils are equal, round, and reactive to light. Right eye exhibits no discharge. Left eye exhibits no discharge. No scleral icterus.  Neck: Normal range of motion. No JVD present. No thyromegaly present.  Cardiovascular: Normal rate, regular rhythm, normal heart sounds and intact distal pulses.  Exam reveals no gallop and no friction rub.   No murmur heard. Pulmonary/Chest: Effort normal and breath sounds normal. No respiratory distress. She has no wheezes. She has no rales.  Abdominal: Soft. Bowel sounds are normal. She exhibits no distension. There is no tenderness. There is no rebound and no guarding.  LBM 10/31/2016   Genitourinary:  Genitourinary Comments: Incontinent   Musculoskeletal: She exhibits  no edema, tenderness or deformity.  Moves x 4 extremities without any difficulties except right arm cast in place.   Lymphadenopathy:    She has no cervical adenopathy.  Neurological: She is alert.  Pleasantly confused at baseline  Skin: Skin is warm and dry. No rash noted. No erythema. No pallor.  Psychiatric: She has a normal mood and affect.    Labs reviewed:  Recent Labs  09/26/16 0545 09/27/16 0538 09/28/16 0515 10/04/16  NA 140 135 137 139  K 3.5 3.5 3.8 4.3  CL 107 101 102  --   CO2 24 26 25   --   GLUCOSE 128* 235* 239*  --   BUN 16 13 17  24*  CREATININE 0.77 1.09* 1.05* 1.0  CALCIUM 8.3* 8.3* 8.4*  --   MG 2.1 1.3*  1.2* 1.7  --   PHOS  --  3.3  --   --     Recent Labs  09/25/16 2107  09/26/16 0545 09/27/16 0538 09/28/16 0515 10/04/16 10/17/16  WBC 7.5  --  7.0 7.0 6.3 4.9 5.3  NEUTROABS 5.6  --   --  4.4  --   --   --   HGB 8.7*  < > 10.0* 9.5* 9.8* 10.6* 11.3*  HCT 27.6*  < > 32.2* 30.2* 31.2* 35* 36  MCV 81.9  --  82.4 81.4 81.5  --   --   PLT 203  --  238 208 203 249 128*  < > = values in this interval not displayed. Lab Results  Component Value Date   TSH 1.109 12/18/2012   Lab Results  Component Value Date   HGBA1C 6.8 (H) 09/26/2016   Lab Results  Component Value Date   CHOL 200 09/26/2016   HDL 42 09/26/2016   LDLCALC 121 (H) 09/26/2016   TRIG 187 (H) 09/26/2016   CHOLHDL 4.8 09/26/2016   Assessment/Plan 1. Hypothyroidism   Continue on levothyroxine. Check TSH level 11/02/2016.   2. Hyperlipidemia  Continue on lovastatin 20 mg tablet daily. Continue on heart healthy diet. Monitor lipid panel periodically.   3. Type 2 DM Lab Results  Component Value Date   HGBA1C 6.8 (H) 09/26/2016   CBG's ranging in the 110's-300's with occasional 400's. Continue on Novolog per SSI. Increase Lantus to 12 units SQ at bedtime. Continue on ASA EC 81 mg tablet and Lovastatin 20 mg Tablet. Check urine microalbumin ratio.   Family/ staff Communication:  Reviewed plan of care with patient and facility Nurse supervisor  Labs/tests ordered: TSH level 11/02/2016. urine microalbumin ratio. Caesar Bookman, NP

## 2016-11-01 NOTE — ED Notes (Signed)
Pt taken to CT.

## 2016-11-01 NOTE — ED Triage Notes (Addendum)
Per [facility], Pt is coming from [facility] where she had an unwitnessed fall in the bathroom. Pt has small laceration to her left posterior head with scant bleeding. Reports lumbar pain with no injury noted upon assessment. Pt has no deformities noted. Equal grip strength and strength in bilateral legs. Pt is alert to situation and person per baseline. Unknown what happened before the fall per pt.   Vitals per EMS: 267 CBG, 90 HR, 168/90

## 2016-11-01 NOTE — ED Notes (Addendum)
PA staples patient and gave food. Pt to go home.

## 2016-11-01 NOTE — ED Provider Notes (Signed)
[facility]-EMERGENCY DEPT Provider Note   CSN: [id number] Arrival date & time: 11/01/16  1144     History   Chief Complaint Chief Complaint  Patient presents with  . Fall    HPI Sheena [name] is a 76 y.o. female.  HPI 76 year old Caucasian female past medical history significant for hypertension, diabetes, dementia that presents to the emergency department by EMS from nursing facility with unwitnessed fall. Patient is a poor historian due to dementia. Son is at bedside and is able to give limited report because he was not there. Spoke with the nursing facility in the nurse. States that the tech went to get patient in the room and was on the floor in the bathroom. States that the patient attempted to get up to go the bathroom without assistance. Patient is supposed to walk with walker assistance and is a high fall risk. Has had 2 falls in the last 2 months. Unsure how long patient was on the ground. Unsure of loss of consciousness. Patient was placed in cervical spine precautions per EMS. Patient IS low back pain which has been chronic since her last fall where she broke her wrist. Denies any new complaints. The patient did have lacerations to the left occiput with bleeding controlled in the field. Son states the patient is at her baseline. Patient has poor hearing and does not have her hearing aids.  Level V cavity due to dementia. Past Medical History:  Diagnosis Date  . Dementia   . Diabetes mellitus   . Hypertension     Patient Active Problem List   Diagnosis Date Noted  . Fall 09/25/2016  . Acute encephalopathy 09/25/2016  . Wrist fracture, closed, right, initial encounter 09/25/2016  . Hypoglycemia 09/25/2016  . Normocytic anemia 09/25/2016  . Abdominal pain 09/25/2016  . Closed fracture of right wrist   . Psychosis 12/22/2012  . High anion gap metabolic acidosis 12/17/2012  . DKA (diabetic ketoacidoses) (HCC) 12/17/2012  . Altered mental status 12/17/2012  . Severe Diabetic  gastroparesis 12/17/2012  . H/O diarrhea 12/17/2012  . Dementia 12/17/2012  . Hypothyroidism 12/17/2012  . Diabetes mellitus, type 2 (HCC) 12/17/2012  . Hypokalemia 12/17/2012  . Dehydration 12/17/2012  . Chronic pain 12/17/2012    Past Surgical History:  Procedure Laterality Date  . BACK SURGERY    . INNER EAR SURGERY    . NOSE SURGERY      OB History    No data available       Home Medications    Prior to Admission medications   Medication Sig Start Date End Date Taking? Authorizing Provider  acetaminophen (TYLENOL) 500 MG tablet Take 1,000 mg by mouth 3 (three) times daily.    [provider]  aspirin EC 81 MG tablet Take 81 mg by mouth daily.      [provider]  Calcium Carbonate-Vitamin D (CALCIUM-VITAMIN D) 600-200 MG-UNIT CAPS Take 1 tablet by mouth daily.    [provider]  cholecalciferol (VITAMIN D) 1000 UNITS tablet Take 1,000 Units by mouth daily.    [provider]  divalproex (DEPAKOTE SPRINKLE) 125 MG capsule Take 1 capsule (125 mg total) by mouth every 12 (twelve) hours. 12/23/12   Georgann HousekeeperHusain, Karrar, MD  donepezil (ARICEPT) 10 MG tablet Take 10 mg by mouth at bedtime.      [provider]  fish oil-omega-3 fatty acids 1000 MG capsule Take 1 g by mouth daily.    [provider]  ibuprofen (ADVIL,MOTRIN) 200 MG  tablet Take 600 mg by mouth every 6 (six) hours as needed. pain    [provider]  insulin aspart (NOVOLOG) 100 UNIT/ML injection Inject 0-10 Units into the skin 3 (three) times daily as needed for high blood sugar.    [provider]  insulin glargine (LANTUS) 100 UNIT/ML injection Inject 10 Units into the skin at bedtime.    [provider]  levothyroxine (SYNTHROID, LEVOTHROID) 88 MCG tablet Take 88 mcg by mouth daily before breakfast.  07/10/16   [provider]  lovastatin (MEVACOR) 20 MG tablet Take 20 mg by mouth at bedtime.    [provider]  magnesium  chloride (SLOW-MAG) 64 MG TBEC Take 2 tablets (128 mg total) by mouth daily. 12/23/12   Georgann Housekeeper, MD  mirtazapine (REMERON) 7.5 MG tablet Take 7.5 mg by mouth at bedtime.      [provider]  Multiple Vitamin (MULITIVITAMIN WITH MINERALS) TABS Take 1 tablet by mouth daily.    [provider]  potassium chloride (K-DUR,KLOR-CON) 10 MEQ tablet Take 1 tablet (10 mEq total) by mouth 2 (two) times daily. 12/23/12   Georgann Housekeeper, MD  QUEtiapine (SEROQUEL) 50 MG tablet Take 1 tablet (50 mg total) by mouth at bedtime. 12/23/12   Georgann Housekeeper, MD  UNABLE TO FIND Med Name: Med pass 120 mL by mouth 2 times daily    [provider]  vitamin B-12 (CYANOCOBALAMIN) 1000 MCG tablet Take 1,000 mcg by mouth daily.    [provider]  vitamin C (ASCORBIC ACID) 500 MG tablet Take 500 mg by mouth daily.    [provider]    Family History Family History  Problem Relation Age of Onset  . Dementia Mother     Social History Social History  Substance Use Topics  . Smoking status: Never Smoker  . Smokeless tobacco: Never Used  . Alcohol use No     Allergies   Bactrim   Review of Systems Review of Systems  Unable to perform ROS: Dementia     Physical Exam Updated Vital Signs BP (!) 167/77   Pulse 80   Temp 98.2 F (36.8 C)   Resp 14   Ht 5\' 5"  (1.651 m)   Wt 57.2 kg (126 lb)   SpO2 99%   BMI 20.97 kg/m   Physical Exam  Constitutional: She is oriented to person, place, and time. She appears well-developed and well-nourished. No distress.  Difficult of hearing which is baseline for patient.  HENT:  Head: Normocephalic.  Mouth/Throat: Oropharynx is clear and moist.  No hemotympanum bilaterally. No septal hematoma.  2 cm laceration to the left occiput with bleeding controlled. Hematoma noted. No skull depression noted.  Eyes: Conjunctivae and EOM are normal. Pupils are equal, round, and reactive to light. Right eye exhibits no discharge.  Left eye exhibits no discharge. No scleral icterus.  Neck: Normal range of motion. Neck supple. No thyromegaly present.  Cardiovascular: Normal rate, regular rhythm, normal heart sounds and intact distal pulses.  Exam reveals no gallop and no friction rub.   No murmur heard. Pulmonary/Chest: Effort normal and breath sounds normal. No respiratory distress. She has no wheezes. She has no rales. She exhibits no tenderness.  Abdominal: Soft. Bowel sounds are normal. She exhibits no distension. There is no tenderness. There is no rebound and no guarding.  Musculoskeletal: Normal range of motion.  Midline L-spine tenderness. No deformity or step-offs noted. Paraspinal tenderness noted. No midline T-spine tenderness. Moving all 4  extremities without difficulties.  DP pulses are 2+ bilaterally. Sensation intact. Cap refill normal.  Pelvis is stable. No pain with internal/external rotation of hips.  Lymphadenopathy:    She has no cervical adenopathy.  Neurological: She is alert and oriented to person, place, and time.  Difficult to perform accurate neurologic exam due to the patient's dementia. Patient does have strength 5 out of 5 in lower extremities bilaterally. Reflexes are normal.  Skin: Skin is warm and dry. Capillary refill takes less than 2 seconds.  Nursing note and vitals reviewed.    ED Treatments / Results  Labs (all labs ordered are listed, but only abnormal results are displayed) Labs Reviewed  BASIC METABOLIC PANEL - Abnormal; Notable for the following:       Result Value   Chloride 98 (*)    Glucose, Bld 255 (*)    Creatinine, Ser 1.16 (*)    GFR calc non Af Amer 45 (*)    GFR calc Af Amer 52 (*)    All other components within normal limits  CBC - Abnormal; Notable for the following:    WBC 12.1 (*)    Hemoglobin 11.6 (*)    RDW 17.7 (*)    Platelets 139 (*)    All other components within normal limits  URINALYSIS, ROUTINE W REFLEX MICROSCOPIC - Abnormal; Notable for the  following:    Glucose, UA >=500 (*)    Bacteria, UA RARE (*)    Squamous Epithelial / LPF 0-5 (*)    All other components within normal limits  CBG MONITORING, ED - Abnormal; Notable for the following:    Glucose-Capillary 235 (*)    All other components within normal limits  CK    EKG  EKG Interpretation  Date/Time:  Thursday Nov 01 2016 11:49:54 EDT Ventricular Rate:  90 PR Interval:    QRS Duration: 74 QT Interval:  389 QTC Calculation: 476 R Axis:   -48 Text Interpretation:  Sinus rhythm Left anterior fascicular block Low voltage, precordial leads No significant change since last tracing Confirmed by Doug Sou [id number]) on 11/01/2016 2:08:22 PM       Radiology Dg Lumbar Spine Complete  Result Date: 11/01/2016 CLINICAL DATA:  Low back pain after falling EXAM: LUMBAR SPINE - COMPLETE 4+ VIEW COMPARISON:  Lumbar spine radiograph 09/25/2016 FINDINGS: There is transitional lumbosacral anatomy with a partially sacralized L5 with a left assimilation joint. There are post augmentation changes at L1 and L3. Alignment is normal. No acute fracture. Height loss of the L4 vertebral body is unchanged, as is the height loss of L1. Visualized abdominal soft tissues are unremarkable. Aortic atherosclerosis. IMPRESSION: Unchanged appearance of the lumbar spine, status post vertebral augmentation at L1 and L3. Aortic atherosclerosis. Electronically Signed   By: Deatra Robinson M.D.   On: 11/01/2016 13:47   Ct Head Wo Contrast  Result Date: 11/01/2016 CLINICAL DATA:  76 year old female with a history of fall EXAM: CT HEAD WITHOUT CONTRAST CT CERVICAL SPINE WITHOUT CONTRAST TECHNIQUE: Multidetector CT imaging of the head and cervical spine was performed following the standard protocol without intravenous contrast. Multiplanar CT image reconstructions of the cervical spine were also generated. COMPARISON:  None. FINDINGS: CT HEAD FINDINGS Brain: No acute intracranial hemorrhage. No midline shift or  mass effect. Brain volume loss, similar to the comparison. Similar appearance of white matter hypodensity. Unchanged configuration the ventricles. Gray-white differentiation maintained. Vascular: Mild intracranial atherosclerosis. Skull: No acute fracture.  No aggressive bony lesion. Sinuses/Orbits: Unremarkable appearance of  the orbits. Unremarkable paranasal sinuses. Other: Surgical changes of right-sided cochlear implant. Fluid layered within the mastoidectomy site, unchanged. CT CERVICAL SPINE FINDINGS Alignment: No subluxation. Cervical vertebral elements maintain alignment. Similar appearance of focal kyphosis at the cervicothoracic junction. Skull base and vertebrae: Craniocervical junction maintains alignment. No skullbase fracture. No fracture line identified. Unchanged configuration of the C7 vertebral body and T1 vertebral body. Osteopenia. Soft tissues and spinal canal: Surgical changes of thyroidectomy. Dense calcifications of the bilateral carotid arteries. Lymph nodes are present, not enlarged by CT size criteria. Disc levels: Multilevel degenerative disc disease. Most pronounced changes at C5-C6 where there is posterior disc osteophyte complex without significant narrowing. There are also posterior disc osteophyte complex at C3-C4 and C4-C5 with associated uncovertebral joint disease. Ankylosis of the C2-C3 the sets bilaterally. Upper chest: Unremarkable appearance of the lungs. Other: None IMPRESSION: Head CT: No CT evidence of acute intracranial abnormality. Similar appearance of chronic white matter disease. Cervical spine: No CT evidence of acute fracture or malalignment of the cervical spine. Multilevel degenerative disc disease. Dense carotid calcified atherosclerosis Electronically Signed   By: Gilmer Mor D.O.   On: 11/01/2016 13:21   Ct Cervical Spine Wo Contrast  Result Date: 11/01/2016 CLINICAL DATA:  76 year old female with a history of fall EXAM: CT HEAD WITHOUT CONTRAST CT  CERVICAL SPINE WITHOUT CONTRAST TECHNIQUE: Multidetector CT imaging of the head and cervical spine was performed following the standard protocol without intravenous contrast. Multiplanar CT image reconstructions of the cervical spine were also generated. COMPARISON:  None. FINDINGS: CT HEAD FINDINGS Brain: No acute intracranial hemorrhage. No midline shift or mass effect. Brain volume loss, similar to the comparison. Similar appearance of white matter hypodensity. Unchanged configuration the ventricles. Gray-white differentiation maintained. Vascular: Mild intracranial atherosclerosis. Skull: No acute fracture.  No aggressive bony lesion. Sinuses/Orbits: Unremarkable appearance of the orbits. Unremarkable paranasal sinuses. Other: Surgical changes of right-sided cochlear implant. Fluid layered within the mastoidectomy site, unchanged. CT CERVICAL SPINE FINDINGS Alignment: No subluxation. Cervical vertebral elements maintain alignment. Similar appearance of focal kyphosis at the cervicothoracic junction. Skull base and vertebrae: Craniocervical junction maintains alignment. No skullbase fracture. No fracture line identified. Unchanged configuration of the C7 vertebral body and T1 vertebral body. Osteopenia. Soft tissues and spinal canal: Surgical changes of thyroidectomy. Dense calcifications of the bilateral carotid arteries. Lymph nodes are present, not enlarged by CT size criteria. Disc levels: Multilevel degenerative disc disease. Most pronounced changes at C5-C6 where there is posterior disc osteophyte complex without significant narrowing. There are also posterior disc osteophyte complex at C3-C4 and C4-C5 with associated uncovertebral joint disease. Ankylosis of the C2-C3 the sets bilaterally. Upper chest: Unremarkable appearance of the lungs. Other: None IMPRESSION: Head CT: No CT evidence of acute intracranial abnormality. Similar appearance of chronic white matter disease. Cervical spine: No CT evidence of  acute fracture or malalignment of the cervical spine. Multilevel degenerative disc disease. Dense carotid calcified atherosclerosis Electronically Signed   By: Gilmer Mor D.O.   On: 11/01/2016 13:21    Procedures Procedures (including critical care time) LACERATION REPAIR Performed by: Demetrios Loll Authorized by: Demetrios Loll Consent: Verbal consent obtained. Risks and benefits: risks, benefits and alternatives were discussed Consent given by: patient Patient identity confirmed: provided demographic data Prepped and Draped in normal sterile fashion Wound explored  Laceration Location: left occiput of scalp  Laceration Length: 2 cm  No Foreign Bodies seen or palpated  Anesthesia: local infiltration  Local anesthetic: lidocaine 1% with epinephrine  Anesthetic total:  10 ml  Irrigation method: syringe Amount of cleaning: standard 50cc of ns  Skin closure: staples  Number of sutures: 4  Technique: simple closure  Patient tolerance: Patient tolerated the procedure well with no immediate complications.  Medications Ordered in ED Medications  acetaminophen (TYLENOL) tablet 650 mg (650 mg Oral Given 11/01/16 1345)  Tdap (BOOSTRIX) injection 0.5 mL (0.5 mLs Intramuscular Given 11/01/16 1528)  lidocaine-EPINEPHrine (XYLOCAINE W/EPI) 2 %-1:200000 (PF) injection 10 mL (10 mLs Infiltration Given 11/01/16 1531)     Initial Impression / Assessment and Plan / ED Course  I have reviewed the triage vital signs and the nursing notes.  Pertinent labs & imaging results that were available during my care of the patient were reviewed by me and considered in my medical decision making (see chart for details).     Patient resents to the emergency department today after being found in the bathroom of an apparent fall. Nursing home states that patient is supposed to have assistance to walk but try to get up by herself to go to the bathroom. Patient does have laceration to left  occiput is a cervical c-collar. She complains of low back pain. Patient has had 2 falls in the past 2 months. Son at bedside states the patient is best use a walker. He states the patient is at her baseline. No apparent neuro deficits. Moving all 4 extremities. All extremities are neurovascularly intact. Abdomen is nontender. Labs are grossly unremarkable. Creatinine is at baseline. Thrombocytosis is noted which is at baseline. Mild leukocytosis of 12,000 but patient denies any infectious complaints. Elevated glucose with patient is diabetic. CK is normal. UA shows no signs of infection. Vital signs remained stable in the ED. EKG shows no changes as compared with prior as reviewed by myself and Dr. Ethelda Chick. Unknown tetanus status. Tetanus was updated. Laceration was repaired. Imaging of head, cervical spine, lumbar spine was obtained that showed no acute changes. No urinary retention, loss of bowel or bladder, saddle paresthesias concerning for cauda equina. Patient was by mouth challenged without any difficulties. Able to ambulate with assistance at baseline. Patient to be discharged back home by EMS to nursing facility. Updated on plan of care. She is hemodynamically stable in no acute distress at this time. Pt seen and evaluated by Dr. Ethelda Chick who is agreeable with the above plan.   Final Clinical Impressions(s) / ED Diagnoses   Final diagnoses:  Fall, initial encounter  Laceration of scalp, initial encounter  Acute midline low back pain without sciatica    New Prescriptions New Prescriptions   No medications on file     Wallace Keller 11/01/16 1819    Doug Sou, MD 11/02/16 [phone]

## 2016-11-01 NOTE — ED Notes (Signed)
Pt returned from CT °

## 2016-11-07 DIAGNOSIS — Z9181 History of falling: Secondary | ICD-10-CM | POA: Diagnosis not present

## 2016-11-07 DIAGNOSIS — M25531 Pain in right wrist: Secondary | ICD-10-CM | POA: Diagnosis not present

## 2016-11-07 DIAGNOSIS — M79601 Pain in right arm: Secondary | ICD-10-CM | POA: Diagnosis not present

## 2016-11-19 DIAGNOSIS — E785 Hyperlipidemia, unspecified: Secondary | ICD-10-CM | POA: Diagnosis not present

## 2016-11-19 DIAGNOSIS — F0390 Unspecified dementia without behavioral disturbance: Secondary | ICD-10-CM | POA: Diagnosis not present

## 2016-11-19 DIAGNOSIS — R2689 Other abnormalities of gait and mobility: Secondary | ICD-10-CM | POA: Diagnosis not present

## 2016-11-19 DIAGNOSIS — E119 Type 2 diabetes mellitus without complications: Secondary | ICD-10-CM | POA: Diagnosis not present

## 2016-11-19 DIAGNOSIS — M4856XD Collapsed vertebra, not elsewhere classified, lumbar region, subsequent encounter for fracture with routine healing: Secondary | ICD-10-CM | POA: Diagnosis not present

## 2016-11-19 DIAGNOSIS — Z7409 Other reduced mobility: Secondary | ICD-10-CM | POA: Diagnosis not present

## 2016-11-19 DIAGNOSIS — R278 Other lack of coordination: Secondary | ICD-10-CM | POA: Diagnosis not present

## 2016-11-19 DIAGNOSIS — Z4789 Encounter for other orthopedic aftercare: Secondary | ICD-10-CM | POA: Diagnosis not present

## 2016-11-19 DIAGNOSIS — M25531 Pain in right wrist: Secondary | ICD-10-CM | POA: Diagnosis not present

## 2016-11-19 DIAGNOSIS — M545 Low back pain: Secondary | ICD-10-CM | POA: Diagnosis not present

## 2016-11-19 DIAGNOSIS — R488 Other symbolic dysfunctions: Secondary | ICD-10-CM | POA: Diagnosis not present

## 2016-11-19 DIAGNOSIS — Z9181 History of falling: Secondary | ICD-10-CM | POA: Diagnosis not present

## 2016-11-19 DIAGNOSIS — S72142D Displaced intertrochanteric fracture of left femur, subsequent encounter for closed fracture with routine healing: Secondary | ICD-10-CM | POA: Diagnosis not present

## 2016-11-19 DIAGNOSIS — M6281 Muscle weakness (generalized): Secondary | ICD-10-CM | POA: Diagnosis not present

## 2016-11-19 DIAGNOSIS — R2681 Unsteadiness on feet: Secondary | ICD-10-CM | POA: Diagnosis not present

## 2016-11-19 DIAGNOSIS — R1310 Dysphagia, unspecified: Secondary | ICD-10-CM | POA: Diagnosis not present

## 2016-11-19 DIAGNOSIS — R296 Repeated falls: Secondary | ICD-10-CM | POA: Diagnosis not present

## 2016-11-19 DIAGNOSIS — E039 Hypothyroidism, unspecified: Secondary | ICD-10-CM | POA: Diagnosis not present

## 2016-11-19 DIAGNOSIS — S62101D Fracture of unspecified carpal bone, right wrist, subsequent encounter for fracture with routine healing: Secondary | ICD-10-CM | POA: Diagnosis not present

## 2016-11-19 DIAGNOSIS — R262 Difficulty in walking, not elsewhere classified: Secondary | ICD-10-CM | POA: Diagnosis not present

## 2016-11-19 DIAGNOSIS — M79601 Pain in right arm: Secondary | ICD-10-CM | POA: Diagnosis not present

## 2016-11-19 DIAGNOSIS — R531 Weakness: Secondary | ICD-10-CM | POA: Diagnosis not present

## 2016-11-21 DIAGNOSIS — M79601 Pain in right arm: Secondary | ICD-10-CM | POA: Diagnosis not present

## 2016-11-21 DIAGNOSIS — Z9181 History of falling: Secondary | ICD-10-CM | POA: Diagnosis not present

## 2016-12-17 DIAGNOSIS — E039 Hypothyroidism, unspecified: Secondary | ICD-10-CM | POA: Diagnosis not present

## 2016-12-19 DIAGNOSIS — M25531 Pain in right wrist: Secondary | ICD-10-CM | POA: Diagnosis not present

## 2016-12-19 DIAGNOSIS — R531 Weakness: Secondary | ICD-10-CM | POA: Diagnosis not present

## 2016-12-20 ENCOUNTER — Other Ambulatory Visit: Payer: Self-pay | Admitting: Orthopedic Surgery

## 2016-12-20 DIAGNOSIS — Z7409 Other reduced mobility: Secondary | ICD-10-CM | POA: Diagnosis not present

## 2016-12-20 DIAGNOSIS — F0390 Unspecified dementia without behavioral disturbance: Secondary | ICD-10-CM | POA: Diagnosis not present

## 2016-12-20 DIAGNOSIS — M545 Low back pain, unspecified: Secondary | ICD-10-CM

## 2016-12-20 DIAGNOSIS — E119 Type 2 diabetes mellitus without complications: Secondary | ICD-10-CM | POA: Diagnosis not present

## 2016-12-20 DIAGNOSIS — E039 Hypothyroidism, unspecified: Secondary | ICD-10-CM | POA: Diagnosis not present

## 2016-12-28 ENCOUNTER — Ambulatory Visit
Admission: RE | Admit: 2016-12-28 | Discharge: 2016-12-28 | Disposition: A | Discharge status: Home / Self Care | Payer: Medicare Other | Source: Ambulatory Visit | Attending: Orthopedic Surgery | Admitting: Orthopedic Surgery

## 2016-12-28 DIAGNOSIS — M545 Low back pain, unspecified: Secondary | ICD-10-CM

## 2017-01-04 DIAGNOSIS — R1310 Dysphagia, unspecified: Secondary | ICD-10-CM | POA: Diagnosis not present

## 2017-01-04 DIAGNOSIS — Z7409 Other reduced mobility: Secondary | ICD-10-CM | POA: Diagnosis not present

## 2017-01-04 DIAGNOSIS — E039 Hypothyroidism, unspecified: Secondary | ICD-10-CM | POA: Diagnosis not present

## 2017-01-04 DIAGNOSIS — F0390 Unspecified dementia without behavioral disturbance: Secondary | ICD-10-CM | POA: Diagnosis not present

## 2017-01-09 DIAGNOSIS — M545 Low back pain: Secondary | ICD-10-CM | POA: Diagnosis not present

## 2017-01-09 DIAGNOSIS — M4856XD Collapsed vertebra, not elsewhere classified, lumbar region, subsequent encounter for fracture with routine healing: Secondary | ICD-10-CM | POA: Diagnosis not present

## 2017-01-15 ENCOUNTER — Other Ambulatory Visit: Payer: Self-pay | Admitting: Physical Medicine and Rehabilitation

## 2017-01-15 DIAGNOSIS — S32020D Wedge compression fracture of second lumbar vertebra, subsequent encounter for fracture with routine healing: Secondary | ICD-10-CM

## 2017-01-15 DIAGNOSIS — S32040D Wedge compression fracture of fourth lumbar vertebra, subsequent encounter for fracture with routine healing: Secondary | ICD-10-CM

## 2017-01-16 DIAGNOSIS — M6281 Muscle weakness (generalized): Secondary | ICD-10-CM | POA: Diagnosis not present

## 2017-01-16 DIAGNOSIS — R488 Other symbolic dysfunctions: Secondary | ICD-10-CM | POA: Diagnosis not present

## 2017-01-16 DIAGNOSIS — R531 Weakness: Secondary | ICD-10-CM | POA: Diagnosis not present

## 2017-01-17 DIAGNOSIS — R488 Other symbolic dysfunctions: Secondary | ICD-10-CM | POA: Diagnosis not present

## 2017-01-17 DIAGNOSIS — I10 Essential (primary) hypertension: Secondary | ICD-10-CM | POA: Diagnosis not present

## 2017-01-17 DIAGNOSIS — M6281 Muscle weakness (generalized): Secondary | ICD-10-CM | POA: Diagnosis not present

## 2017-01-18 DIAGNOSIS — R488 Other symbolic dysfunctions: Secondary | ICD-10-CM | POA: Diagnosis not present

## 2017-01-18 DIAGNOSIS — M6281 Muscle weakness (generalized): Secondary | ICD-10-CM | POA: Diagnosis not present

## 2017-01-21 DIAGNOSIS — M6281 Muscle weakness (generalized): Secondary | ICD-10-CM | POA: Diagnosis not present

## 2017-01-21 DIAGNOSIS — R488 Other symbolic dysfunctions: Secondary | ICD-10-CM | POA: Diagnosis not present

## 2017-01-22 DIAGNOSIS — M6281 Muscle weakness (generalized): Secondary | ICD-10-CM | POA: Diagnosis not present

## 2017-01-22 DIAGNOSIS — R488 Other symbolic dysfunctions: Secondary | ICD-10-CM | POA: Diagnosis not present

## 2017-01-23 DIAGNOSIS — M6281 Muscle weakness (generalized): Secondary | ICD-10-CM | POA: Diagnosis not present

## 2017-01-23 DIAGNOSIS — R488 Other symbolic dysfunctions: Secondary | ICD-10-CM | POA: Diagnosis not present

## 2017-01-24 DIAGNOSIS — R488 Other symbolic dysfunctions: Secondary | ICD-10-CM | POA: Diagnosis not present

## 2017-01-24 DIAGNOSIS — M6281 Muscle weakness (generalized): Secondary | ICD-10-CM | POA: Diagnosis not present

## 2017-01-25 DIAGNOSIS — M6281 Muscle weakness (generalized): Secondary | ICD-10-CM | POA: Diagnosis not present

## 2017-01-25 DIAGNOSIS — R488 Other symbolic dysfunctions: Secondary | ICD-10-CM | POA: Diagnosis not present

## 2017-01-28 DIAGNOSIS — M6281 Muscle weakness (generalized): Secondary | ICD-10-CM | POA: Diagnosis not present

## 2017-01-28 DIAGNOSIS — R488 Other symbolic dysfunctions: Secondary | ICD-10-CM | POA: Diagnosis not present

## 2017-01-29 DIAGNOSIS — R488 Other symbolic dysfunctions: Secondary | ICD-10-CM | POA: Diagnosis not present

## 2017-01-29 DIAGNOSIS — M6281 Muscle weakness (generalized): Secondary | ICD-10-CM | POA: Diagnosis not present

## 2017-01-30 DIAGNOSIS — M6281 Muscle weakness (generalized): Secondary | ICD-10-CM | POA: Diagnosis not present

## 2017-01-30 DIAGNOSIS — R488 Other symbolic dysfunctions: Secondary | ICD-10-CM | POA: Diagnosis not present

## 2017-01-31 DIAGNOSIS — R488 Other symbolic dysfunctions: Secondary | ICD-10-CM | POA: Diagnosis not present

## 2017-01-31 DIAGNOSIS — M6281 Muscle weakness (generalized): Secondary | ICD-10-CM | POA: Diagnosis not present

## 2017-02-01 DIAGNOSIS — M6281 Muscle weakness (generalized): Secondary | ICD-10-CM | POA: Diagnosis not present

## 2017-02-01 DIAGNOSIS — R488 Other symbolic dysfunctions: Secondary | ICD-10-CM | POA: Diagnosis not present

## 2017-02-04 DIAGNOSIS — R488 Other symbolic dysfunctions: Secondary | ICD-10-CM | POA: Diagnosis not present

## 2017-02-04 DIAGNOSIS — M6281 Muscle weakness (generalized): Secondary | ICD-10-CM | POA: Diagnosis not present

## 2017-02-05 DIAGNOSIS — M6281 Muscle weakness (generalized): Secondary | ICD-10-CM | POA: Diagnosis not present

## 2017-02-05 DIAGNOSIS — R488 Other symbolic dysfunctions: Secondary | ICD-10-CM | POA: Diagnosis not present

## 2017-02-06 DIAGNOSIS — R488 Other symbolic dysfunctions: Secondary | ICD-10-CM | POA: Diagnosis not present

## 2017-02-06 DIAGNOSIS — M6281 Muscle weakness (generalized): Secondary | ICD-10-CM | POA: Diagnosis not present

## 2017-02-07 DIAGNOSIS — R488 Other symbolic dysfunctions: Secondary | ICD-10-CM | POA: Diagnosis not present

## 2017-02-07 DIAGNOSIS — M6281 Muscle weakness (generalized): Secondary | ICD-10-CM | POA: Diagnosis not present

## 2017-02-08 DIAGNOSIS — M6281 Muscle weakness (generalized): Secondary | ICD-10-CM | POA: Diagnosis not present

## 2017-02-08 DIAGNOSIS — R488 Other symbolic dysfunctions: Secondary | ICD-10-CM | POA: Diagnosis not present

## 2017-02-11 DIAGNOSIS — M6281 Muscle weakness (generalized): Secondary | ICD-10-CM | POA: Diagnosis not present

## 2017-02-11 DIAGNOSIS — R488 Other symbolic dysfunctions: Secondary | ICD-10-CM | POA: Diagnosis not present

## 2017-02-12 DIAGNOSIS — R488 Other symbolic dysfunctions: Secondary | ICD-10-CM | POA: Diagnosis not present

## 2017-02-12 DIAGNOSIS — M6281 Muscle weakness (generalized): Secondary | ICD-10-CM | POA: Diagnosis not present

## 2017-02-13 DIAGNOSIS — M6281 Muscle weakness (generalized): Secondary | ICD-10-CM | POA: Diagnosis not present

## 2017-02-13 DIAGNOSIS — R488 Other symbolic dysfunctions: Secondary | ICD-10-CM | POA: Diagnosis not present

## 2017-02-15 DIAGNOSIS — R488 Other symbolic dysfunctions: Secondary | ICD-10-CM | POA: Diagnosis not present

## 2017-02-15 DIAGNOSIS — I10 Essential (primary) hypertension: Secondary | ICD-10-CM | POA: Diagnosis not present

## 2017-02-15 DIAGNOSIS — M6281 Muscle weakness (generalized): Secondary | ICD-10-CM | POA: Diagnosis not present

## 2017-02-17 ENCOUNTER — Inpatient Hospital Stay (HOSPITAL_COMMUNITY)
Admission: EM | Admit: 2017-02-17 | Discharge: 2017-02-23 | DRG: 480 | Disposition: A | Discharge status: Skilled Nursing Facility | Payer: Medicare Other | Attending: Nephrology | Admitting: Nephrology

## 2017-02-17 ENCOUNTER — Emergency Department (HOSPITAL_COMMUNITY): Payer: Medicare Other

## 2017-02-17 ENCOUNTER — Encounter (HOSPITAL_COMMUNITY): Payer: Self-pay | Admitting: Emergency Medicine

## 2017-02-17 DIAGNOSIS — Z9889 Other specified postprocedural states: Secondary | ICD-10-CM

## 2017-02-17 DIAGNOSIS — S52209A Unspecified fracture of shaft of unspecified ulna, initial encounter for closed fracture: Secondary | ICD-10-CM | POA: Diagnosis not present

## 2017-02-17 DIAGNOSIS — Z7982 Long term (current) use of aspirin: Secondary | ICD-10-CM

## 2017-02-17 DIAGNOSIS — R000 Tachycardia, unspecified: Secondary | ICD-10-CM | POA: Diagnosis not present

## 2017-02-17 DIAGNOSIS — F0390 Unspecified dementia without behavioral disturbance: Secondary | ICD-10-CM | POA: Diagnosis present

## 2017-02-17 DIAGNOSIS — M25511 Pain in right shoulder: Secondary | ICD-10-CM | POA: Diagnosis not present

## 2017-02-17 DIAGNOSIS — Z79899 Other long term (current) drug therapy: Secondary | ICD-10-CM

## 2017-02-17 DIAGNOSIS — S42201A Unspecified fracture of upper end of right humerus, initial encounter for closed fracture: Secondary | ICD-10-CM | POA: Diagnosis not present

## 2017-02-17 DIAGNOSIS — E1169 Type 2 diabetes mellitus with other specified complication: Secondary | ICD-10-CM

## 2017-02-17 DIAGNOSIS — S42211A Unspecified displaced fracture of surgical neck of right humerus, initial encounter for closed fracture: Secondary | ICD-10-CM | POA: Diagnosis present

## 2017-02-17 DIAGNOSIS — E1165 Type 2 diabetes mellitus with hyperglycemia: Secondary | ICD-10-CM | POA: Diagnosis present

## 2017-02-17 DIAGNOSIS — G9341 Metabolic encephalopathy: Secondary | ICD-10-CM | POA: Diagnosis present

## 2017-02-17 DIAGNOSIS — S42291A Other displaced fracture of upper end of right humerus, initial encounter for closed fracture: Secondary | ICD-10-CM

## 2017-02-17 DIAGNOSIS — W1830XA Fall on same level, unspecified, initial encounter: Secondary | ICD-10-CM | POA: Diagnosis present

## 2017-02-17 DIAGNOSIS — S52001A Unspecified fracture of upper end of right ulna, initial encounter for closed fracture: Secondary | ICD-10-CM | POA: Diagnosis present

## 2017-02-17 DIAGNOSIS — H9190 Unspecified hearing loss, unspecified ear: Secondary | ICD-10-CM | POA: Diagnosis present

## 2017-02-17 DIAGNOSIS — S62101A Fracture of unspecified carpal bone, right wrist, initial encounter for closed fracture: Secondary | ICD-10-CM | POA: Diagnosis not present

## 2017-02-17 DIAGNOSIS — E860 Dehydration: Secondary | ICD-10-CM | POA: Diagnosis present

## 2017-02-17 DIAGNOSIS — S299XXA Unspecified injury of thorax, initial encounter: Secondary | ICD-10-CM | POA: Diagnosis not present

## 2017-02-17 DIAGNOSIS — E119 Type 2 diabetes mellitus without complications: Secondary | ICD-10-CM

## 2017-02-17 DIAGNOSIS — I10 Essential (primary) hypertension: Secondary | ICD-10-CM | POA: Diagnosis present

## 2017-02-17 DIAGNOSIS — S72142A Displaced intertrochanteric fracture of left femur, initial encounter for closed fracture: Principal | ICD-10-CM | POA: Diagnosis present

## 2017-02-17 DIAGNOSIS — Z888 Allergy status to other drugs, medicaments and biological substances status: Secondary | ICD-10-CM

## 2017-02-17 DIAGNOSIS — N179 Acute kidney failure, unspecified: Secondary | ICD-10-CM | POA: Diagnosis not present

## 2017-02-17 DIAGNOSIS — S52101A Unspecified fracture of upper end of right radius, initial encounter for closed fracture: Secondary | ICD-10-CM | POA: Diagnosis present

## 2017-02-17 DIAGNOSIS — W19XXXA Unspecified fall, initial encounter: Secondary | ICD-10-CM | POA: Diagnosis present

## 2017-02-17 DIAGNOSIS — S0990XA Unspecified injury of head, initial encounter: Secondary | ICD-10-CM | POA: Diagnosis not present

## 2017-02-17 DIAGNOSIS — Z794 Long term (current) use of insulin: Secondary | ICD-10-CM

## 2017-02-17 DIAGNOSIS — R52 Pain, unspecified: Secondary | ICD-10-CM

## 2017-02-17 DIAGNOSIS — S72009A Fracture of unspecified part of neck of unspecified femur, initial encounter for closed fracture: Secondary | ICD-10-CM | POA: Diagnosis present

## 2017-02-17 DIAGNOSIS — Z09 Encounter for follow-up examination after completed treatment for conditions other than malignant neoplasm: Secondary | ICD-10-CM

## 2017-02-17 DIAGNOSIS — E039 Hypothyroidism, unspecified: Secondary | ICD-10-CM | POA: Diagnosis present

## 2017-02-17 DIAGNOSIS — Z419 Encounter for procedure for purposes other than remedying health state, unspecified: Secondary | ICD-10-CM

## 2017-02-17 DIAGNOSIS — Z9181 History of falling: Secondary | ICD-10-CM

## 2017-02-17 DIAGNOSIS — D62 Acute posthemorrhagic anemia: Secondary | ICD-10-CM | POA: Diagnosis not present

## 2017-02-17 DIAGNOSIS — D72829 Elevated white blood cell count, unspecified: Secondary | ICD-10-CM | POA: Diagnosis present

## 2017-02-17 DIAGNOSIS — S72021A Displaced fracture of epiphysis (separation) (upper) of right femur, initial encounter for closed fracture: Secondary | ICD-10-CM | POA: Diagnosis not present

## 2017-02-17 DIAGNOSIS — R079 Chest pain, unspecified: Secondary | ICD-10-CM | POA: Diagnosis not present

## 2017-02-17 DIAGNOSIS — R296 Repeated falls: Secondary | ICD-10-CM | POA: Diagnosis present

## 2017-02-17 DIAGNOSIS — D696 Thrombocytopenia, unspecified: Secondary | ICD-10-CM | POA: Diagnosis present

## 2017-02-17 DIAGNOSIS — Z66 Do not resuscitate: Secondary | ICD-10-CM | POA: Diagnosis present

## 2017-02-17 DIAGNOSIS — Z820 Family history of epilepsy and other diseases of the nervous system: Secondary | ICD-10-CM

## 2017-02-17 LAB — CBC WITH DIFFERENTIAL/PLATELET
Basophils Absolute: 0 10*3/uL (ref 0.0–0.1)
Basophils Relative: 0 %
EOS PCT: 0 %
Eosinophils Absolute: 0 10*3/uL (ref 0.0–0.7)
HEMATOCRIT: 31.4 % — AB (ref 36.0–46.0)
Hemoglobin: 9.8 g/dL — ABNORMAL LOW (ref 12.0–15.0)
LYMPHS PCT: 11 %
Lymphs Abs: 1.5 10*3/uL (ref 0.7–4.0)
MCH: 27.8 pg (ref 26.0–34.0)
MCHC: 31.2 g/dL (ref 30.0–36.0)
MCV: 89 fL (ref 78.0–100.0)
MONO ABS: 0.8 10*3/uL (ref 0.1–1.0)
MONOS PCT: 6 %
NEUTROS ABS: 11.1 10*3/uL — AB (ref 1.7–7.7)
Neutrophils Relative %: 83 %
PLATELETS: 241 10*3/uL (ref 150–400)
RBC: 3.53 MIL/uL — ABNORMAL LOW (ref 3.87–5.11)
RDW: 15.6 % — ABNORMAL HIGH (ref 11.5–15.5)
WBC: 13.5 10*3/uL — ABNORMAL HIGH (ref 4.0–10.5)

## 2017-02-17 LAB — COMPREHENSIVE METABOLIC PANEL
ALT: 19 U/L (ref 14–54)
ANION GAP: 13 (ref 5–15)
AST: 22 U/L (ref 15–41)
Albumin: 3.3 g/dL — ABNORMAL LOW (ref 3.5–5.0)
Alkaline Phosphatase: 127 U/L — ABNORMAL HIGH (ref 38–126)
BILIRUBIN TOTAL: 1.1 mg/dL (ref 0.3–1.2)
BUN: 27 mg/dL — AB (ref 6–20)
CHLORIDE: 100 mmol/L — AB (ref 101–111)
CO2: 22 mmol/L (ref 22–32)
Calcium: 8.5 mg/dL — ABNORMAL LOW (ref 8.9–10.3)
Creatinine, Ser: 1.46 mg/dL — ABNORMAL HIGH (ref 0.44–1.00)
GFR, EST AFRICAN AMERICAN: 39 mL/min — AB (ref >60)
GFR, EST NON AFRICAN AMERICAN: 33 mL/min — AB (ref >60)
Glucose, Bld: 508 mg/dL (ref 65–99)
POTASSIUM: 4.6 mmol/L (ref 3.5–5.1)
Sodium: 135 mmol/L (ref 135–145)
TOTAL PROTEIN: 6 g/dL — AB (ref 6.5–8.1)

## 2017-02-17 MED ORDER — FENTANYL CITRATE (PF) 100 MCG/2ML IJ SOLN
50.0000 ug | INTRAMUSCULAR | Status: DC | PRN
  Administered 2017-02-18: 50 ug via INTRAVENOUS
  Filled 2017-02-17: qty 2

## 2017-02-17 MED ORDER — INSULIN ASPART 100 UNIT/ML ~~LOC~~ SOLN
5.0000 [IU] | Freq: Once | SUBCUTANEOUS | Status: AC
  Administered 2017-02-18: 5 [IU] via INTRAVENOUS
  Filled 2017-02-17: qty 1

## 2017-02-17 MED ORDER — INSULIN GLARGINE 100 UNIT/ML ~~LOC~~ SOLN
10.0000 [IU] | Freq: Once | SUBCUTANEOUS | Status: AC
  Administered 2017-02-18: 10 [IU] via SUBCUTANEOUS
  Filled 2017-02-17: qty 0.1

## 2017-02-17 NOTE — ED Notes (Signed)
Message sent to pharmacy for meds

## 2017-02-17 NOTE — ED Notes (Signed)
Patient transported to X-ray 

## 2017-02-17 NOTE — ED Triage Notes (Signed)
[facility]. Witnessed by her roommate. Report was she got out of wheel chair and fell. Suppose to use a walker. On EMS arrival pt was in bed placed by staff. Pt is hard of hearing. Deformity of left femur

## 2017-02-17 NOTE — ED Provider Notes (Signed)
Emergency Department Provider Note   I have reviewed the triage vital signs and the nursing notes.   HISTORY  Chief Complaint Fall and Leg Injury   HPI Sheena [name] is a 76 y.o. female with a history of dementia and diabetes and hypertension presents the emergency department today after a mechanical fall with resultantleft-sided hip pain and difficulty walking. Also with right arm and shoulder pain. No other associated symptoms. Did have some fat on the way in which seemed to help with the symptoms. Pain is worse with movement.   Past Medical History:  Diagnosis Date  . Dementia   . Diabetes mellitus   . Hypertension     Patient Active Problem List   Diagnosis Date Noted  . Hip fracture (HCC) 02/18/2017  . Closed intertrochanteric fracture of left femur (HCC) 02/18/2017  . Closed fracture of right proximal radius and ulna 02/18/2017  . Closed fracture of proximal end of right humerus 02/18/2017  . Fall 09/25/2016  . Acute encephalopathy 09/25/2016  . Wrist fracture, closed, right, initial encounter 09/25/2016  . Hypoglycemia 09/25/2016  . Normocytic anemia 09/25/2016  . Abdominal pain 09/25/2016  . Closed fracture of right wrist   . Psychosis 12/22/2012  . High anion gap metabolic acidosis 12/17/2012  . DKA (diabetic ketoacidoses) (HCC) 12/17/2012  . Altered mental status 12/17/2012  . Severe Diabetic gastroparesis 12/17/2012  . H/O diarrhea 12/17/2012  . Dementia 12/17/2012  . Hypothyroidism 12/17/2012  . Diabetes mellitus, type 2 (HCC) 12/17/2012  . Hypokalemia 12/17/2012  . Dehydration 12/17/2012  . Chronic pain 12/17/2012    Past Surgical History:  Procedure Laterality Date  . BACK SURGERY    . FEMUR IM NAIL Left 02/18/2017   Procedure: INTRAMEDULLARY (IM) NAIL FEMORAL;  Surgeon: Sheral Apley, MD;  Location: [facility] OR;  Service: Orthopedics;  Laterality: Left;  . INNER EAR SURGERY    . NOSE SURGERY      Current Outpatient Rx  . Order #:  [id number] Class: Print  . Order #: [id number] Class: Print    Allergies Bactrim  Family History  Problem Relation Age of Onset  . Dementia Mother     Social History Social History  Substance Use Topics  . Smoking status: Never Smoker  . Smokeless tobacco: Never Used  . Alcohol use No    Review of Systems  All other systems negative except as documented in the HPI. All pertinent positives and negatives as reviewed in the HPI. ____________________________________________  PHYSICAL EXAM:  VITAL SIGNS: ED Triage Vitals [02/17/17 2039]  Enc Vitals Group     BP      Pulse      Resp      Temp      Temp src      SpO2 93 %     Weight      Height      Head Circumference      Peak Flow      Pain Score      Pain Loc      Pain Edu?      Excl. in GC?     Constitutional: Alert and oriented. Well appearing and in no acute distress. Eyes: Conjunctivae are normal. PERRL. EOMI. Head: Atraumatic. Nose: No congestion/rhinnorhea. Mouth/Throat: Mucous membranes are moist.  Oropharynx non-erythematous. Neck: No stridor.  No meningeal signs.   Cardiovascular: Normal rate, regular rhythm. Good peripheral circulation. Grossly normal heart sounds.   Respiratory: Normal respiratory effort.  No retractions. Lungs CTAB. Gastrointestinal: Soft and  nontender. No distention.  Musculoskeletal: tenderness to left hip and pelvis,pain with range of motion of left hip, tenderness to right distal forearm and elbow and shoulder all with range of motion pain as well. Left leg is shortened. Neurologic:  Normal speech and language. No gross focal neurologic deficits are appreciated.  Skin:  Skin is warm, dry and intact. No rash noted.  ____________________________________________   LABS (all labs ordered are listed, but only abnormal results are displayed)  Labs Reviewed  CBC WITH DIFFERENTIAL/PLATELET - Abnormal; Notable for the following:       Result Value   WBC 13.5 (*)    RBC 3.53 (*)     Hemoglobin 9.8 (*)    HCT 31.4 (*)    RDW 15.6 (*)    Neutro Abs 11.1 (*)    All other components within normal limits  COMPREHENSIVE METABOLIC PANEL - Abnormal; Notable for the following:    Chloride 100 (*)    Glucose, Bld 508 (*)    BUN 27 (*)    Creatinine, Ser 1.46 (*)    Calcium 8.5 (*)    Total Protein 6.0 (*)    Albumin 3.3 (*)    Alkaline Phosphatase 127 (*)    GFR calc non Af Amer 33 (*)    GFR calc Af Amer 39 (*)    All other components within normal limits  BASIC METABOLIC PANEL - Abnormal; Notable for the following:    CO2 21 (*)    Glucose, Bld 481 (*)    BUN 28 (*)    Creatinine, Ser 1.51 (*)    Calcium 8.2 (*)    GFR calc non Af Amer 32 (*)    GFR calc Af Amer 37 (*)    All other components within normal limits  CBC - Abnormal; Notable for the following:    WBC 13.3 (*)    RBC 2.97 (*)    Hemoglobin 8.2 (*)    HCT 26.5 (*)    All other components within normal limits  URINALYSIS, ROUTINE W REFLEX MICROSCOPIC - Abnormal; Notable for the following:    APPearance HAZY (*)    Glucose, UA >=500 (*)    Ketones, ur 20 (*)    Nitrite POSITIVE (*)    Bacteria, UA RARE (*)    Squamous Epithelial / LPF 0-5 (*)    All other components within normal limits  GLUCOSE, CAPILLARY - Abnormal; Notable for the following:    Glucose-Capillary 434 (*)    All other components within normal limits  GLUCOSE, CAPILLARY - Abnormal; Notable for the following:    Glucose-Capillary 436 (*)    All other components within normal limits  GLUCOSE, CAPILLARY - Abnormal; Notable for the following:    Glucose-Capillary 349 (*)    All other components within normal limits  GLUCOSE, CAPILLARY - Abnormal; Notable for the following:    Glucose-Capillary 228 (*)    All other components within normal limits  HEMOGLOBIN A1C - Abnormal; Notable for the following:    Hgb A1c MFr Bld 8.8 (*)    All other components within normal limits  BASIC METABOLIC PANEL - Abnormal; Notable for the  following:    Glucose, Bld 242 (*)    BUN 31 (*)    Creatinine, Ser 1.45 (*)    Calcium 7.8 (*)    GFR calc non Af Amer 34 (*)    GFR calc Af Amer 39 (*)    All other components within normal limits  CBC - Abnormal; Notable for the following:    RBC 2.21 (*)    Hemoglobin 6.0 (*)    HCT 20.2 (*)    MCHC 29.7 (*)    RDW 16.3 (*)    All other components within normal limits  GLUCOSE, CAPILLARY - Abnormal; Notable for the following:    Glucose-Capillary 250 (*)    All other components within normal limits  GLUCOSE, CAPILLARY - Abnormal; Notable for the following:    Glucose-Capillary 270 (*)    All other components within normal limits  GLUCOSE, CAPILLARY - Abnormal; Notable for the following:    Glucose-Capillary 238 (*)    All other components within normal limits  VITAMIN B12 - Abnormal; Notable for the following:    Vitamin B-12 1,308 (*)    All other components within normal limits  IRON AND TIBC - Abnormal; Notable for the following:    Iron 354 (*)    Saturation Ratios 84 (*)    All other components within normal limits  RETICULOCYTES - Abnormal; Notable for the following:    Retic Ct Pct 3.4 (*)    All other components within normal limits  GLUCOSE, CAPILLARY - Abnormal; Notable for the following:    Glucose-Capillary 238 (*)    All other components within normal limits  GLUCOSE, CAPILLARY - Abnormal; Notable for the following:    Glucose-Capillary 231 (*)    All other components within normal limits  GLUCOSE, CAPILLARY - Abnormal; Notable for the following:    Glucose-Capillary 202 (*)    All other components within normal limits  BASIC METABOLIC PANEL - Abnormal; Notable for the following:    Glucose, Bld 220 (*)    BUN 21 (*)    Creatinine, Ser 1.15 (*)    Calcium 7.7 (*)    GFR calc non Af Amer 45 (*)    GFR calc Af Amer 52 (*)    All other components within normal limits  CBC - Abnormal; Notable for the following:    RBC 3.41 (*)    Hemoglobin 9.6 (*)      HCT 29.3 (*)    RDW 15.8 (*)    Platelets 117 (*)    All other components within normal limits  GLUCOSE, CAPILLARY - Abnormal; Notable for the following:    Glucose-Capillary 173 (*)    All other components within normal limits  GLUCOSE, CAPILLARY - Abnormal; Notable for the following:    Glucose-Capillary 225 (*)    All other components within normal limits  GLUCOSE, CAPILLARY - Abnormal; Notable for the following:    Glucose-Capillary 217 (*)    All other components within normal limits  GLUCOSE, CAPILLARY - Abnormal; Notable for the following:    Glucose-Capillary 226 (*)    All other components within normal limits  CBG MONITORING, ED - Abnormal; Notable for the following:    Glucose-Capillary 390 (*)    All other components within normal limits  SURGICAL PCR SCREEN  FOLATE  FERRITIN  TYPE AND SCREEN  ABO/RH  PREPARE RBC (CROSSMATCH)   ____________________________________________  EKG   EKG Interpretation  Date/Time:  Sunday February 17 2017 22:10:14 EDT Ventricular Rate:  110 PR Interval:    QRS Duration: 68 QT Interval:  341 QTC Calculation: 462 R Axis:   -50 Text Interpretation:  Sinus tachycardia Left anterior fascicular block Low voltage, precordial leads No significant change since last tracing Confirmed by Cathren LaineSteinl, Kevin ([id number]) on 02/18/2017 11:38:13 AM  ____________________________________________  RADIOLOGY  No results found. ____________________________________________   PROCEDURES  Procedure(s) performed:   Procedures ____________________________________________   INITIAL IMPRESSION / ASSESSMENT AND PLAN / ED COURSE  Pertinent labs & imaging results that were available during my care of the patient were reviewed by me and considered in my medical decision making (see chart for details).  Ay 73-year-old with a proximal humerus, proximalradial head and distal ulnar fracture on the right side. Also with a left hip fracture. I  discussed the case with Dr. Eulah Pont with hand surgery and he will plan for surgery tomorrow for both hand and hip. Admit to medicine.   ____________________________________________  FINAL CLINICAL IMPRESSION(S) / ED DIAGNOSES  Final diagnoses:  Pain  Closed displaced intertrochanteric fracture of left femur, initial encounter (HCC)  Other closed displaced fracture of proximal end of right humerus, initial encounter  Closed fracture of right wrist, initial encounter    MEDICATIONS GIVEN DURING THIS VISIT:  Medications  senna-docusate (Senokot-S) tablet 1 tablet ( Oral MAR Unhold 02/18/17 1622)  ondansetron (ZOFRAN) tablet 4 mg ( Oral MAR Unhold 02/18/17 1622)    Or  ondansetron (ZOFRAN) injection 4 mg ( Intravenous MAR Unhold 02/18/17 1622)  labetalol (NORMODYNE,TRANDATE) injection 10 mg ( Intravenous MAR Unhold 02/18/17 1622)  insulin aspart (novoLOG) injection 5 Units ( Intravenous MAR Unhold 02/18/17 1622)  donepezil (ARICEPT) tablet 10 mg (10 mg Oral Given 02/19/17 2105)  levothyroxine (SYNTHROID, LEVOTHROID) tablet 100 mcg (100 mcg Oral Given 02/20/17 0648)  magnesium oxide (MAG-OX) tablet 200 mg (200 mg Oral Not Given 02/20/17 1000)  mirtazapine (REMERON) tablet 7.5 mg (7.5 mg Oral Given 02/19/17 2105)  multivitamin with minerals tablet 1 tablet (1 tablet Oral Given 02/19/17 1625)  QUEtiapine (SEROQUEL) tablet 50 mg (50 mg Oral Given 02/19/17 2105)  vitamin B-12 (CYANOCOBALAMIN) tablet 1,000 mcg (1,000 mcg Oral Given 02/19/17 1625)  potassium chloride (K-DUR,KLOR-CON) CR tablet 10 mEq (10 mEq Oral Not Given 02/20/17 1000)  vitamin C (ASCORBIC ACID) tablet 500 mg (500 mg Oral Given 02/19/17 1625)  HYDROcodone-acetaminophen (NORCO/VICODIN) 5-325 MG per tablet 1 tablet ( Oral MAR Unhold 02/18/17 1622)  acetaminophen (TYLENOL) tablet 650 mg ( Oral MAR Unhold 02/18/17 1622)  divalproex (DEPAKOTE SPRINKLE) capsule 125 mg (125 mg Oral Not Given 02/20/17 1000)  insulin aspart (novoLOG) injection 0-9  Units (3 Units Subcutaneous Given 02/20/17 0823)  insulin glargine (LANTUS) injection 30 Units (30 Units Subcutaneous Given 02/20/17 1005)  enoxaparin (LOVENOX) injection 40 mg (40 mg Subcutaneous Given 02/19/17 1228)  feeding supplement (GLUCERNA SHAKE) (GLUCERNA SHAKE) liquid 237 mL (237 mLs Oral Not Given 02/20/17 1000)  insulin glargine (LANTUS) injection 10 Units (10 Units Subcutaneous Given 02/18/17 0041)  insulin aspart (novoLOG) injection 5 Units (5 Units Intravenous Given 02/18/17 0032)  chlorhexidine (HIBICLENS) 4 % liquid 4 application (4 application Topical Given 02/18/17 1325)  povidone-iodine 10 % swab 2 application (2 application Topical Given 02/18/17 1006)  ceFAZolin (ANCEF) IVPB 2g/100 mL premix (2 g Intravenous Given 02/18/17 1454)  0.9 %  sodium chloride infusion ( Intravenous Stopped 02/19/17 1534)    NEW OUTPATIENT MEDICATIONS STARTED DURING THIS VISIT:  Current Discharge Medication List    START taking these medications   Details  enoxaparin (LOVENOX) 40 MG/0.4ML injection Inject 0.4 mLs (40 mg total) into the skin daily. For 30 days post op for DVT prophylaxis Qty: 30 Syringe, Refills: 0    HYDROcodone-acetaminophen (NORCO) 5-325 MG tablet Take 1 tablet by mouth every 6 (six) hours as needed for moderate pain. Qty:  20 tablet, Refills: 0        Note:  This document was prepared using Dragon voice recognition software and may include unintentional dictation errors.    Marily Memos, MD 02/20/17 1137

## 2017-02-17 NOTE — ED Notes (Signed)
Pt resting, hard of hearing but easy to arouse. Communicating with patient by witting on paper.

## 2017-02-17 NOTE — ED Notes (Signed)
ED Provider at bedside. 

## 2017-02-17 NOTE — ED Triage Notes (Signed)
Ems gave patient of fentnyl

## 2017-02-18 ENCOUNTER — Inpatient Hospital Stay (HOSPITAL_COMMUNITY): Payer: Medicare Other | Admitting: Certified Registered"

## 2017-02-18 ENCOUNTER — Encounter (HOSPITAL_COMMUNITY): Payer: Self-pay | Admitting: Certified Registered"

## 2017-02-18 ENCOUNTER — Inpatient Hospital Stay (HOSPITAL_COMMUNITY): Payer: Medicare Other

## 2017-02-18 ENCOUNTER — Encounter (HOSPITAL_COMMUNITY)
Admission: EM | Disposition: A | Discharge status: Skilled Nursing Facility | Payer: Self-pay | Source: Home / Self Care | Attending: Nephrology

## 2017-02-18 DIAGNOSIS — S72142D Displaced intertrochanteric fracture of left femur, subsequent encounter for closed fracture with routine healing: Secondary | ICD-10-CM | POA: Diagnosis not present

## 2017-02-18 DIAGNOSIS — S52101A Unspecified fracture of upper end of right radius, initial encounter for closed fracture: Secondary | ICD-10-CM | POA: Diagnosis not present

## 2017-02-18 DIAGNOSIS — Z820 Family history of epilepsy and other diseases of the nervous system: Secondary | ICD-10-CM | POA: Diagnosis not present

## 2017-02-18 DIAGNOSIS — Z4789 Encounter for other orthopedic aftercare: Secondary | ICD-10-CM | POA: Diagnosis not present

## 2017-02-18 DIAGNOSIS — S62101D Fracture of unspecified carpal bone, right wrist, subsequent encounter for fracture with routine healing: Secondary | ICD-10-CM | POA: Diagnosis not present

## 2017-02-18 DIAGNOSIS — Z09 Encounter for follow-up examination after completed treatment for conditions other than malignant neoplasm: Secondary | ICD-10-CM | POA: Diagnosis not present

## 2017-02-18 DIAGNOSIS — F0391 Unspecified dementia with behavioral disturbance: Secondary | ICD-10-CM

## 2017-02-18 DIAGNOSIS — R488 Other symbolic dysfunctions: Secondary | ICD-10-CM | POA: Diagnosis not present

## 2017-02-18 DIAGNOSIS — S42211A Unspecified displaced fracture of surgical neck of right humerus, initial encounter for closed fracture: Secondary | ICD-10-CM | POA: Diagnosis not present

## 2017-02-18 DIAGNOSIS — W1830XA Fall on same level, unspecified, initial encounter: Secondary | ICD-10-CM | POA: Diagnosis present

## 2017-02-18 DIAGNOSIS — F0390 Unspecified dementia without behavioral disturbance: Secondary | ICD-10-CM | POA: Diagnosis present

## 2017-02-18 DIAGNOSIS — E1169 Type 2 diabetes mellitus with other specified complication: Secondary | ICD-10-CM | POA: Diagnosis not present

## 2017-02-18 DIAGNOSIS — Z7982 Long term (current) use of aspirin: Secondary | ICD-10-CM | POA: Diagnosis not present

## 2017-02-18 DIAGNOSIS — W19XXXD Unspecified fall, subsequent encounter: Secondary | ICD-10-CM | POA: Diagnosis not present

## 2017-02-18 DIAGNOSIS — S42201A Unspecified fracture of upper end of right humerus, initial encounter for closed fracture: Secondary | ICD-10-CM | POA: Diagnosis not present

## 2017-02-18 DIAGNOSIS — S42309A Unspecified fracture of shaft of humerus, unspecified arm, initial encounter for closed fracture: Secondary | ICD-10-CM | POA: Diagnosis not present

## 2017-02-18 DIAGNOSIS — S52125A Nondisplaced fracture of head of left radius, initial encounter for closed fracture: Secondary | ICD-10-CM | POA: Diagnosis not present

## 2017-02-18 DIAGNOSIS — R52 Pain, unspecified: Secondary | ICD-10-CM | POA: Diagnosis not present

## 2017-02-18 DIAGNOSIS — R296 Repeated falls: Secondary | ICD-10-CM | POA: Diagnosis present

## 2017-02-18 DIAGNOSIS — Z79899 Other long term (current) drug therapy: Secondary | ICD-10-CM | POA: Diagnosis not present

## 2017-02-18 DIAGNOSIS — S52001A Unspecified fracture of upper end of right ulna, initial encounter for closed fracture: Secondary | ICD-10-CM | POA: Diagnosis not present

## 2017-02-18 DIAGNOSIS — S42295A Other nondisplaced fracture of upper end of left humerus, initial encounter for closed fracture: Secondary | ICD-10-CM | POA: Diagnosis not present

## 2017-02-18 DIAGNOSIS — Z810 Family history of intellectual disabilities: Secondary | ICD-10-CM | POA: Diagnosis not present

## 2017-02-18 DIAGNOSIS — S52101D Unspecified fracture of upper end of right radius, subsequent encounter for closed fracture with routine healing: Secondary | ICD-10-CM | POA: Diagnosis not present

## 2017-02-18 DIAGNOSIS — D62 Acute posthemorrhagic anemia: Secondary | ICD-10-CM | POA: Diagnosis not present

## 2017-02-18 DIAGNOSIS — E039 Hypothyroidism, unspecified: Secondary | ICD-10-CM | POA: Diagnosis not present

## 2017-02-18 DIAGNOSIS — S72009A Fracture of unspecified part of neck of unspecified femur, initial encounter for closed fracture: Secondary | ICD-10-CM | POA: Diagnosis not present

## 2017-02-18 DIAGNOSIS — E860 Dehydration: Secondary | ICD-10-CM | POA: Diagnosis present

## 2017-02-18 DIAGNOSIS — S42201D Unspecified fracture of upper end of right humerus, subsequent encounter for fracture with routine healing: Secondary | ICD-10-CM | POA: Diagnosis not present

## 2017-02-18 DIAGNOSIS — I10 Essential (primary) hypertension: Secondary | ICD-10-CM | POA: Diagnosis present

## 2017-02-18 DIAGNOSIS — G9341 Metabolic encephalopathy: Secondary | ICD-10-CM | POA: Diagnosis not present

## 2017-02-18 DIAGNOSIS — Z888 Allergy status to other drugs, medicaments and biological substances status: Secondary | ICD-10-CM | POA: Diagnosis not present

## 2017-02-18 DIAGNOSIS — R4020 Unspecified coma: Secondary | ICD-10-CM | POA: Diagnosis not present

## 2017-02-18 DIAGNOSIS — E1165 Type 2 diabetes mellitus with hyperglycemia: Secondary | ICD-10-CM | POA: Diagnosis present

## 2017-02-18 DIAGNOSIS — D72829 Elevated white blood cell count, unspecified: Secondary | ICD-10-CM | POA: Diagnosis present

## 2017-02-18 DIAGNOSIS — F0150 Vascular dementia without behavioral disturbance: Secondary | ICD-10-CM | POA: Diagnosis not present

## 2017-02-18 DIAGNOSIS — S52209A Unspecified fracture of shaft of unspecified ulna, initial encounter for closed fracture: Secondary | ICD-10-CM | POA: Diagnosis present

## 2017-02-18 DIAGNOSIS — S72142A Displaced intertrochanteric fracture of left femur, initial encounter for closed fracture: Secondary | ICD-10-CM | POA: Diagnosis not present

## 2017-02-18 DIAGNOSIS — R4182 Altered mental status, unspecified: Secondary | ICD-10-CM | POA: Diagnosis not present

## 2017-02-18 DIAGNOSIS — Z66 Do not resuscitate: Secondary | ICD-10-CM | POA: Diagnosis present

## 2017-02-18 DIAGNOSIS — R2681 Unsteadiness on feet: Secondary | ICD-10-CM | POA: Diagnosis not present

## 2017-02-18 DIAGNOSIS — S52025A Nondisplaced fracture of olecranon process without intraarticular extension of left ulna, initial encounter for closed fracture: Secondary | ICD-10-CM | POA: Diagnosis not present

## 2017-02-18 DIAGNOSIS — S52001D Unspecified fracture of upper end of right ulna, subsequent encounter for closed fracture with routine healing: Secondary | ICD-10-CM | POA: Diagnosis not present

## 2017-02-18 DIAGNOSIS — N179 Acute kidney failure, unspecified: Secondary | ICD-10-CM | POA: Diagnosis not present

## 2017-02-18 DIAGNOSIS — S42202A Unspecified fracture of upper end of left humerus, initial encounter for closed fracture: Secondary | ICD-10-CM | POA: Diagnosis not present

## 2017-02-18 DIAGNOSIS — E119 Type 2 diabetes mellitus without complications: Secondary | ICD-10-CM | POA: Diagnosis not present

## 2017-02-18 DIAGNOSIS — M6281 Muscle weakness (generalized): Secondary | ICD-10-CM | POA: Diagnosis not present

## 2017-02-18 DIAGNOSIS — D696 Thrombocytopenia, unspecified: Secondary | ICD-10-CM | POA: Diagnosis not present

## 2017-02-18 DIAGNOSIS — H9190 Unspecified hearing loss, unspecified ear: Secondary | ICD-10-CM | POA: Diagnosis not present

## 2017-02-18 DIAGNOSIS — Z9889 Other specified postprocedural states: Secondary | ICD-10-CM | POA: Diagnosis not present

## 2017-02-18 DIAGNOSIS — Z794 Long term (current) use of insulin: Secondary | ICD-10-CM | POA: Diagnosis not present

## 2017-02-18 DIAGNOSIS — R2689 Other abnormalities of gait and mobility: Secondary | ICD-10-CM | POA: Diagnosis not present

## 2017-02-18 HISTORY — PX: FEMUR IM NAIL: SHX1597

## 2017-02-18 LAB — URINALYSIS, ROUTINE W REFLEX MICROSCOPIC
Bilirubin Urine: NEGATIVE
Glucose, UA: >=500 mg/dL — AB
Hgb urine dipstick: NEGATIVE
KETONES UR: 20 mg/dL — AB
Leukocytes, UA: NEGATIVE
Nitrite: POSITIVE — AB
PH: 6 (ref 5.0–8.0)
Protein, ur: NEGATIVE mg/dL
RBC / HPF: NONE SEEN RBC/hpf (ref 0–5)
Specific Gravity, Urine: 1.027 (ref 1.005–1.030)

## 2017-02-18 LAB — BASIC METABOLIC PANEL
Anion gap: 12 (ref 5–15)
BUN: 28 mg/dL — AB (ref 6–20)
CHLORIDE: 105 mmol/L (ref 101–111)
CO2: 21 mmol/L — AB (ref 22–32)
CREATININE: 1.51 mg/dL — AB (ref 0.44–1.00)
Calcium: 8.2 mg/dL — ABNORMAL LOW (ref 8.9–10.3)
GFR calc Af Amer: 37 mL/min — ABNORMAL LOW (ref >60)
GFR calc non Af Amer: 32 mL/min — ABNORMAL LOW (ref >60)
Glucose, Bld: 481 mg/dL — ABNORMAL HIGH (ref 65–99)
Potassium: 4.6 mmol/L (ref 3.5–5.1)
Sodium: 138 mmol/L (ref 135–145)

## 2017-02-18 LAB — CBG MONITORING, ED: GLUCOSE-CAPILLARY: 390 mg/dL — AB (ref 65–99)

## 2017-02-18 LAB — GLUCOSE, CAPILLARY
GLUCOSE-CAPILLARY: 434 mg/dL — AB (ref 65–99)
GLUCOSE-CAPILLARY: 436 mg/dL — AB (ref 65–99)
Glucose-Capillary: 349 mg/dL — ABNORMAL HIGH (ref 65–99)
Glucose-Capillary: 228 mg/dL — ABNORMAL HIGH (ref 65–99)
Glucose-Capillary: 250 mg/dL — ABNORMAL HIGH (ref 65–99)

## 2017-02-18 LAB — SURGICAL PCR SCREEN
MRSA, PCR: NEGATIVE
STAPHYLOCOCCUS AUREUS: NEGATIVE

## 2017-02-18 LAB — CBC
HCT: 26.5 % — ABNORMAL LOW (ref 36.0–46.0)
Hemoglobin: 8.2 g/dL — ABNORMAL LOW (ref 12.0–15.0)
MCH: 27.6 pg (ref 26.0–34.0)
MCHC: 30.9 g/dL (ref 30.0–36.0)
MCV: 89.2 fL (ref 78.0–100.0)
PLATELETS: 276 10*3/uL (ref 150–400)
RBC: 2.97 MIL/uL — AB (ref 3.87–5.11)
RDW: 15.3 % (ref 11.5–15.5)
WBC: 13.3 10*3/uL — ABNORMAL HIGH (ref 4.0–10.5)

## 2017-02-18 LAB — HEMOGLOBIN A1C
Hgb A1c MFr Bld: 8.8 % — ABNORMAL HIGH (ref 4.8–5.6)
MEAN PLASMA GLUCOSE: 205.86 mg/dL

## 2017-02-18 LAB — ABO/RH: ABO/RH(D): B POS

## 2017-02-18 SURGERY — INSERTION, INTRAMEDULLARY ROD, FEMUR
Anesthesia: General | Site: Hip | Laterality: Left

## 2017-02-18 MED ORDER — LABETALOL HCL 5 MG/ML IV SOLN
10.0000 mg | INTRAVENOUS | Status: DC | PRN
  Administered 2017-02-20: 10 mg via INTRAVENOUS
  Filled 2017-02-18 (×3): qty 4

## 2017-02-18 MED ORDER — VITAMIN B-12 1000 MCG PO TABS
1000.0000 ug | ORAL_TABLET | Freq: Every evening | ORAL | Status: DC
  Administered 2017-02-19 – 2017-02-22 (×3): 1000 ug via ORAL
  Filled 2017-02-18 (×4): qty 1

## 2017-02-18 MED ORDER — FENTANYL CITRATE (PF) 250 MCG/5ML IJ SOLN
INTRAMUSCULAR | Status: DC | PRN
  Administered 2017-02-18 (×2): 50 ug via INTRAVENOUS

## 2017-02-18 MED ORDER — FENTANYL CITRATE (PF) 100 MCG/2ML IJ SOLN
12.5000 ug | INTRAMUSCULAR | Status: DC
  Administered 2017-02-18: 12.5 ug via INTRAVENOUS
  Filled 2017-02-18: qty 2

## 2017-02-18 MED ORDER — MAGNESIUM OXIDE 400 (241.3 MG) MG PO TABS
200.0000 mg | ORAL_TABLET | Freq: Every day | ORAL | Status: DC
  Administered 2017-02-18 – 2017-02-23 (×5): 200 mg via ORAL
  Filled 2017-02-18: qty 1
  Filled 2017-02-18: qty 0.5
  Filled 2017-02-18: qty 1
  Filled 2017-02-18 (×2): qty 0.5
  Filled 2017-02-18: qty 1
  Filled 2017-02-18 (×3): qty 0.5
  Filled 2017-02-18 (×2): qty 1

## 2017-02-18 MED ORDER — FENTANYL CITRATE (PF) 100 MCG/2ML IJ SOLN: 25.0000 ug | INTRAMUSCULAR | Status: DC | PRN

## 2017-02-18 MED ORDER — POTASSIUM CHLORIDE CRYS ER 10 MEQ PO TBCR
10.0000 meq | EXTENDED_RELEASE_TABLET | Freq: Two times a day (BID) | ORAL | Status: DC
  Administered 2017-02-18 – 2017-02-23 (×9): 10 meq via ORAL
  Filled 2017-02-18 (×10): qty 1

## 2017-02-18 MED ORDER — QUETIAPINE FUMARATE 50 MG PO TABS
50.0000 mg | ORAL_TABLET | Freq: Every day | ORAL | Status: DC
  Administered 2017-02-19 – 2017-02-22 (×4): 50 mg via ORAL
  Filled 2017-02-18 (×5): qty 1

## 2017-02-18 MED ORDER — FENTANYL CITRATE (PF) 250 MCG/5ML IJ SOLN
INTRAMUSCULAR | Status: AC
  Filled 2017-02-18: qty 5

## 2017-02-18 MED ORDER — PHENYLEPHRINE 40 MCG/ML (10ML) SYRINGE FOR IV PUSH (FOR BLOOD PRESSURE SUPPORT)
PREFILLED_SYRINGE | INTRAVENOUS | Status: AC
  Filled 2017-02-18: qty 10

## 2017-02-18 MED ORDER — SODIUM CHLORIDE 0.9 % IV SOLN
INTRAVENOUS | Status: DC
  Administered 2017-02-18 – 2017-02-19 (×2): via INTRAVENOUS

## 2017-02-18 MED ORDER — INSULIN GLARGINE 100 UNIT/ML ~~LOC~~ SOLN
10.0000 [IU] | Freq: Every day | SUBCUTANEOUS | Status: DC
  Filled 2017-02-18: qty 0.1

## 2017-02-18 MED ORDER — PHENYLEPHRINE HCL 10 MG/ML IJ SOLN
INTRAVENOUS | Status: DC | PRN
  Administered 2017-02-18: 40 ug/min via INTRAVENOUS

## 2017-02-18 MED ORDER — SUGAMMADEX SODIUM 200 MG/2ML IV SOLN
INTRAVENOUS | Status: DC | PRN
  Administered 2017-02-18: 200 mg via INTRAVENOUS

## 2017-02-18 MED ORDER — PROPOFOL 10 MG/ML IV BOLUS
INTRAVENOUS | Status: DC | PRN
Start: 2017-02-18 — End: 2017-02-18
  Administered 2017-02-18: 80 mg via INTRAVENOUS

## 2017-02-18 MED ORDER — PHENYLEPHRINE 40 MCG/ML (10ML) SYRINGE FOR IV PUSH (FOR BLOOD PRESSURE SUPPORT)
PREFILLED_SYRINGE | INTRAVENOUS | Status: DC | PRN
  Administered 2017-02-18 (×4): 80 ug via INTRAVENOUS

## 2017-02-18 MED ORDER — DONEPEZIL HCL 10 MG PO TABS
10.0000 mg | ORAL_TABLET | Freq: Every day | ORAL | Status: DC
  Administered 2017-02-19 – 2017-02-22 (×4): 10 mg via ORAL
  Filled 2017-02-18 (×5): qty 1

## 2017-02-18 MED ORDER — INSULIN ASPART 100 UNIT/ML ~~LOC~~ SOLN: 5.0000 [IU] | Freq: Once | SUBCUTANEOUS | Status: DC

## 2017-02-18 MED ORDER — DIVALPROEX SODIUM 125 MG PO DR TAB
125.0000 mg | DELAYED_RELEASE_TABLET | Freq: Two times a day (BID) | ORAL | Status: DC
  Administered 2017-02-18: 125 mg via ORAL
  Filled 2017-02-18 (×3): qty 1

## 2017-02-18 MED ORDER — ONDANSETRON HCL 4 MG PO TABS
4.0000 mg | ORAL_TABLET | Freq: Four times a day (QID) | ORAL | Status: DC | PRN
Start: 2017-02-18 — End: 2017-02-23

## 2017-02-18 MED ORDER — ADULT MULTIVITAMIN W/MINERALS CH
1.0000 | ORAL_TABLET | Freq: Every evening | ORAL | Status: DC
  Administered 2017-02-19 – 2017-02-22 (×3): 1 via ORAL
  Filled 2017-02-18 (×3): qty 1

## 2017-02-18 MED ORDER — CHLORHEXIDINE GLUCONATE 4 % EX LIQD
60.0000 mL | Freq: Once | CUTANEOUS | Status: AC
  Administered 2017-02-18: 4 via TOPICAL

## 2017-02-18 MED ORDER — INSULIN ASPART 100 UNIT/ML ~~LOC~~ SOLN
0.0000 [IU] | SUBCUTANEOUS | Status: DC
  Administered 2017-02-18: 3 [IU] via SUBCUTANEOUS
  Administered 2017-02-18 (×2): 9 [IU] via SUBCUTANEOUS
  Administered 2017-02-18: 7 [IU] via SUBCUTANEOUS
  Administered 2017-02-19: 5 [IU] via SUBCUTANEOUS
  Administered 2017-02-19: 3 [IU] via SUBCUTANEOUS

## 2017-02-18 MED ORDER — ROCURONIUM BROMIDE 10 MG/ML (PF) SYRINGE
PREFILLED_SYRINGE | INTRAVENOUS | Status: AC
  Filled 2017-02-18: qty 5

## 2017-02-18 MED ORDER — ONDANSETRON HCL 4 MG/2ML IJ SOLN
INTRAMUSCULAR | Status: AC
  Filled 2017-02-18: qty 2

## 2017-02-18 MED ORDER — SUGAMMADEX SODIUM 200 MG/2ML IV SOLN
INTRAVENOUS | Status: AC
  Filled 2017-02-18: qty 2

## 2017-02-18 MED ORDER — FENTANYL CITRATE (PF) 100 MCG/2ML IJ SOLN: 12.5000 ug | INTRAMUSCULAR | Status: DC | PRN

## 2017-02-18 MED ORDER — VITAMIN C 500 MG PO TABS
500.0000 mg | ORAL_TABLET | Freq: Every evening | ORAL | Status: DC
  Administered 2017-02-19 – 2017-02-22 (×3): 500 mg via ORAL
  Filled 2017-02-18 (×4): qty 1

## 2017-02-18 MED ORDER — INSULIN GLARGINE 100 UNIT/ML ~~LOC~~ SOLN
10.0000 [IU] | Freq: Every day | SUBCUTANEOUS | Status: DC
  Administered 2017-02-18: 10 [IU] via SUBCUTANEOUS
  Filled 2017-02-18 (×2): qty 0.1

## 2017-02-18 MED ORDER — EPHEDRINE 5 MG/ML INJ
INTRAVENOUS | Status: AC
  Filled 2017-02-18: qty 10

## 2017-02-18 MED ORDER — POVIDONE-IODINE 10 % EX SWAB
2.0000 "application " | Freq: Once | CUTANEOUS | Status: AC
  Administered 2017-02-18: 2 via TOPICAL

## 2017-02-18 MED ORDER — LEVOTHYROXINE SODIUM 100 MCG PO TABS
100.0000 ug | ORAL_TABLET | Freq: Every day | ORAL | Status: DC
  Administered 2017-02-19 – 2017-02-20 (×2): 100 ug via ORAL
  Filled 2017-02-18: qty 2
  Filled 2017-02-18: qty 1

## 2017-02-18 MED ORDER — MIRTAZAPINE 15 MG PO TABS
7.5000 mg | ORAL_TABLET | Freq: Every day | ORAL | Status: DC
  Administered 2017-02-19 – 2017-02-21 (×3): 7.5 mg via ORAL
  Filled 2017-02-18 (×4): qty 1

## 2017-02-18 MED ORDER — ONDANSETRON HCL 4 MG/2ML IJ SOLN: 4.0000 mg | Freq: Once | INTRAMUSCULAR | Status: DC | PRN

## 2017-02-18 MED ORDER — ROCURONIUM BROMIDE 10 MG/ML (PF) SYRINGE
PREFILLED_SYRINGE | INTRAVENOUS | Status: DC | PRN
  Administered 2017-02-18: 10 mg via INTRAVENOUS
  Administered 2017-02-18: 40 mg via INTRAVENOUS

## 2017-02-18 MED ORDER — CEFAZOLIN SODIUM-DEXTROSE 2-4 GM/100ML-% IV SOLN
2.0000 g | INTRAVENOUS | Status: AC
  Administered 2017-02-18: 2 g via INTRAVENOUS
  Filled 2017-02-18: qty 100

## 2017-02-18 MED ORDER — ONDANSETRON HCL 4 MG/2ML IJ SOLN: 4.0000 mg | Freq: Four times a day (QID) | INTRAMUSCULAR | Status: DC | PRN

## 2017-02-18 MED ORDER — ACETAMINOPHEN 325 MG PO TABS
650.0000 mg | ORAL_TABLET | ORAL | Status: DC | PRN
  Administered 2017-02-21 – 2017-02-22 (×2): 650 mg via ORAL
  Filled 2017-02-18 (×2): qty 2

## 2017-02-18 MED ORDER — LIDOCAINE 2% (20 MG/ML) 5 ML SYRINGE
INTRAMUSCULAR | Status: DC | PRN
  Administered 2017-02-18: 50 mg via INTRAVENOUS

## 2017-02-18 MED ORDER — LIDOCAINE 2% (20 MG/ML) 5 ML SYRINGE
INTRAMUSCULAR | Status: AC
  Filled 2017-02-18: qty 5

## 2017-02-18 MED ORDER — LACTATED RINGERS IV SOLN
INTRAVENOUS | Status: DC
  Administered 2017-02-18: 14:00:00 via INTRAVENOUS

## 2017-02-18 MED ORDER — ONDANSETRON HCL 4 MG/2ML IJ SOLN
INTRAMUSCULAR | Status: DC | PRN
  Administered 2017-02-18: 4 mg via INTRAVENOUS

## 2017-02-18 MED ORDER — INSULIN GLARGINE 100 UNIT/ML ~~LOC~~ SOLN: 10.0000 [IU] | Freq: Every day | SUBCUTANEOUS | Status: DC

## 2017-02-18 MED ORDER — SODIUM CHLORIDE 0.9 % IV SOLN
INTRAVENOUS | Status: DC
  Administered 2017-02-18: 03:00:00 via INTRAVENOUS

## 2017-02-18 MED ORDER — MORPHINE SULFATE (PF) 4 MG/ML IV SOLN: 2.0000 mg | INTRAVENOUS | Status: DC | PRN

## 2017-02-18 MED ORDER — HYDROCODONE-ACETAMINOPHEN 5-325 MG PO TABS: 1.0000 | ORAL_TABLET | ORAL | Status: DC | PRN

## 2017-02-18 MED ORDER — SENNOSIDES-DOCUSATE SODIUM 8.6-50 MG PO TABS: 1.0000 | ORAL_TABLET | Freq: Every evening | ORAL | Status: DC | PRN

## 2017-02-18 SURGICAL SUPPLY — 36 items
BIT DRILL AO GAMMA 4.2X180 (BIT) ×3 IMPLANT
CLOSURE STERI-STRIP 1/2X4 (GAUZE/BANDAGES/DRESSINGS) ×1
CLSR STERI-STRIP ANTIMIC 1/2X4 (GAUZE/BANDAGES/DRESSINGS) ×2 IMPLANT
COVER PERINEAL POST (MISCELLANEOUS) ×3 IMPLANT
COVER SURGICAL LIGHT HANDLE (MISCELLANEOUS) ×3 IMPLANT
DRAPE STERI IOBAN 125X83 (DRAPES) ×3 IMPLANT
DRSG MEPILEX BORDER 4X4 (GAUZE/BANDAGES/DRESSINGS) ×3 IMPLANT
DURAPREP 26ML APPLICATOR (WOUND CARE) ×3 IMPLANT
ELECT REM PT RETURN 9FT ADLT (ELECTROSURGICAL) ×3
ELECTRODE REM PT RTRN 9FT ADLT (ELECTROSURGICAL) ×1 IMPLANT
GLOVE BIO SURGEON STRL SZ7.5 (GLOVE) ×6 IMPLANT
GLOVE BIOGEL PI IND STRL 8 (GLOVE) ×2 IMPLANT
GLOVE BIOGEL PI INDICATOR 8 (GLOVE) ×4
GOWN STRL REUS W/ TWL LRG LVL3 (GOWN DISPOSABLE) ×3 IMPLANT
GOWN STRL REUS W/TWL LRG LVL3 (GOWN DISPOSABLE) ×9
GUIDEROD T2 3X1000 (ROD) ×3 IMPLANT
K-WIRE  3.2X450M STR (WIRE) ×2
K-WIRE 3.2X450M STR (WIRE) ×1
KIT BASIN OR (CUSTOM PROCEDURE TRAY) ×3 IMPLANT
KIT NAIL LONG 10X380MM (Orthopedic Implant) ×3 IMPLANT
KIT ROOM TURNOVER OR (KITS) ×3 IMPLANT
KWIRE 3.2X450M STR (WIRE) ×1 IMPLANT
MANIFOLD NEPTUNE II (INSTRUMENTS) ×3 IMPLANT
NS IRRIG 1000ML POUR BTL (IV SOLUTION) ×3 IMPLANT
PACK GENERAL/GYN (CUSTOM PROCEDURE TRAY) ×3 IMPLANT
PAD ARMBOARD 7.5X6 YLW CONV (MISCELLANEOUS) ×6 IMPLANT
SCREW LAG GAMMA 3 TI 10.5X90MM (Screw) ×3 IMPLANT
SCREW LOCKING T2 F/T  5X42.5MM (Screw) ×2 IMPLANT
SCREW LOCKING T2 F/T 5X42.5MM (Screw) ×1 IMPLANT
SUT MNCRL AB 4-0 PS2 18 (SUTURE) IMPLANT
SUT MON AB 2-0 CT1 36 (SUTURE) IMPLANT
SUT VIC AB 0 CT1 27 (SUTURE) ×3
SUT VIC AB 0 CT1 27XBRD ANBCTR (SUTURE) ×1 IMPLANT
TOWEL OR 17X24 6PK STRL BLUE (TOWEL DISPOSABLE) ×3 IMPLANT
TOWEL OR 17X26 10 PK STRL BLUE (TOWEL DISPOSABLE) ×3 IMPLANT
WATER STERILE IRR 1000ML POUR (IV SOLUTION) ×3 IMPLANT

## 2017-02-18 NOTE — Transfer of Care (Signed)
Immediate Anesthesia Transfer of Care Note  Patient: Sheena [name]  Procedure(s) Performed: Procedure(s): INTRAMEDULLARY (IM) NAIL FEMORAL (Left)  Patient Location: PACU  Anesthesia Type:General  Level of Consciousness: drowsy and responds to stimulation  Airway & Oxygen Therapy: Patient Spontanous Breathing and Patient connected to nasal cannula oxygen  Post-op Assessment: Report given to RN and Post -op Vital signs reviewed and stable  Post vital signs: Reviewed and stable  Last Vitals:  Vitals:   02/18/17 0251 02/18/17 0418  BP: (!) 154/59 136/61  Pulse: (!) 110 89  Resp: (!) 22 19  Temp: 37 C 37 C  SpO2: 96% 96%    Last Pain:  Vitals:   02/18/17 0418  TempSrc: Oral  PainSc:          Complications: No apparent anesthesia complications

## 2017-02-18 NOTE — Consult Note (Signed)
ORTHOPAEDIC CONSULTATION  REQUESTING PHYSICIAN: Debbe Odea, MD  Chief Complaint: right upper extremity and left hip pain after mechanical fall  Assessment / Plan: Principal Problem:   Closed intertrochanteric fracture of left femur ([place]) Active Problems:   Dementia   Hypothyroidism   Diabetes mellitus, type 2 ([place])   Fall   Hip fracture ([place])   Closed fracture of right proximal radius and ulna   Closed fracture of proximal end of right humerus  Closed left intertrochanteric femur fracture Plan for Operative fixation today  -NPO  -Medicine team to admit and perform pre-op clearance -Type and Cross for 2 units held for OR -PT/OT post op -Will ammend WB status postop, bedrest for now -Foley for comfort to be removed POD 1/2 -Likely to require Rehab or SNF placement upon discharge.  -VTE prophylaxis: SCDs  Closed right proximal humerus, radius, and ulna fractures: Plan for nonoperative management at this time. Sling for comfort. Nonweightbearing right upper extremity  HPI: Sheena [name] is a 76 y.o. female who complains of left hip and right upper extremity pain after mechanical fall. She presented to the emergency department where imaging shows a closed left intertrochanteric femur fracture and right proximal humerus, radius, and ulna fractures. Orthopedics was consulted for evaluation.  The patient, per her son does mobilize some with therapy. The risks benefits and alternatives of the procedure were discussed with the patient's son.  He verbalizes understanding and wishes to proceed.     Past Medical History:  Diagnosis Date  . Dementia   . Diabetes mellitus   . Hypertension    Past Surgical History:  Procedure Laterality Date  . BACK SURGERY    . INNER EAR SURGERY    . NOSE SURGERY     Social History   Social History  . Marital status: Widowed    Spouse name: N/A  . Number of children: N/A  . Years of education: N/A   Social History Main Topics    . Smoking status: Never Smoker  . Smokeless tobacco: Never Used  . Alcohol use No  . Drug use: No  . Sexual activity: Not Currently   Other Topics Concern  . None   Social History Narrative  . None   Family History  Problem Relation Age of Onset  . Dementia Mother    Allergies  Allergen Reactions  . Bactrim Nausea And Vomiting   Prior to Admission medications   Medication Sig Start Date End Date Taking? Authorizing Provider  acetaminophen (TYLENOL) 325 MG tablet Take 650 mg by mouth every 4 (four) hours as needed (pain).   Yes [provider]  aspirin EC 81 MG tablet Take 81 mg by mouth daily.     Yes [provider]  Calcium Carbonate-Vitamin D (CALCIUM-VITAMIN D) 600-200 MG-UNIT CAPS Take 1 tablet by mouth daily.   Yes [provider]  cholecalciferol (VITAMIN D) 1000 UNITS tablet Take 1,000 Units by mouth daily.   Yes [provider]  divalproex (DEPAKOTE SPRINKLE) 125 MG capsule Take 1 capsule (125 mg total) by mouth every 12 (twelve) hours. 12/23/12  Yes Wenda Low, MD  donepezil (ARICEPT) 10 MG tablet Take 10 mg by mouth at bedtime.     Yes [provider]  fish oil-omega-3 fatty acids 1000 MG capsule Take 1 g by mouth daily.   Yes [provider]  ibuprofen (ADVIL,MOTRIN) 200 MG tablet Take 600 mg by mouth every 6 (six) hours as needed (pain). pain  Yes [provider]  insulin aspart (NOVOLOG) 100 UNIT/ML injection Inject 0-10 Units into the skin See admin instructions. Inject 0-10 units subcutaneously before meals and at bedtime per sliding scale: CBG 60-149 0 units, 150-250 5 units, 251-300 8 units, 301-350 10 units, >350 notify provider   Yes [provider]  insulin glargine (LANTUS) 100 UNIT/ML injection Inject 10 Units into the skin at bedtime.   Yes [provider]  levothyroxine (SYNTHROID, LEVOTHROID) 100 MCG tablet Take 100 mcg by mouth daily before breakfast.   Yes [provider]  lovastatin (MEVACOR) 20 MG tablet Take 20 mg by mouth at bedtime.   Yes [provider]  magnesium oxide (MAG-OX) 400 MG tablet Take 200 mg by mouth daily.   Yes [provider]  mirtazapine (REMERON) 7.5 MG tablet Take 7.5 mg by mouth at bedtime.     Yes [provider]  Multiple Vitamin (MULITIVITAMIN WITH MINERALS) TABS Take 1 tablet by mouth every evening.    Yes [provider]  potassium chloride (K-DUR,KLOR-CON) 10 MEQ tablet Take 1 tablet (10 mEq total) by mouth 2 (two) times daily. 12/23/12  Yes Wenda Low, MD  QUEtiapine (SEROQUEL) 50 MG tablet Take 1 tablet (50 mg total) by mouth at bedtime. 12/23/12  Yes Wenda Low, MD  vitamin B-12 (CYANOCOBALAMIN) 1000 MCG tablet Take 1,000 mcg by mouth every evening.    Yes [provider]  vitamin C (ASCORBIC ACID) 500 MG tablet Take 500 mg by mouth every evening.    Yes [provider]  magnesium chloride (SLOW-MAG) 64 MG TBEC Take 2 tablets (128 mg total) by mouth daily. Patient not taking: Reported on 02/17/2017 12/23/12   Wenda Low, MD   Dg Chest 1 View  Result Date: 02/17/2017 CLINICAL DATA:  Pain after a fall. EXAM: CHEST 1 VIEW COMPARISON:  12/16/2012 FINDINGS: Shallow inspiration. Heart size and pulmonary vascularity are normal. Lungs are clear and expanded. No blunting of costophrenic angles. No pneumothorax. Calcified and tortuous aorta. Surgical clips in the base of the neck. Old right rib fractures. IMPRESSION: No evidence of active pulmonary disease. Electronically Signed   By: Lucienne Capers M.D.   On: 02/17/2017 22:06   Dg Pelvis 1-2 Views  Result Date: 02/17/2017 CLINICAL DATA:  [place] out of the chair. Deformity of left femur and right arm pain. EXAM: PELVIS - 1-2 VIEW COMPARISON:  09/25/2016 FINDINGS: Acute inter trochanteric fracture of the left proximal femur extending to the sub trochanteric region laterally. Displaced lesser trochanteric fragment with  displaced lateral infra trochanteric fragment. No dislocation of the hip joint. Old fracture deformities of the superior and inferior pubic rami bilaterally are unchanged since previous study. Calcification along the right greater trochanter is also unchanged. Diffuse bone demineralization. Degenerative changes in the lower lumbar spine and in both hips. SI joints and symphysis pubis are not displaced. Visualized sacrum appears intact. IMPRESSION: Acute comminuted inter trochanteric and sub trochanteric fracture of the left proximal femur. Diffuse bone demineralization and old fracture deformities as described. Electronically Signed   By: Lucienne Capers M.D.   On: 02/17/2017 21:48   Dg Forearm Right  Result Date: 02/17/2017 CLINICAL DATA:  Right arm pain after a fall. EXAM: RIGHT FOREARM - 2 VIEW COMPARISON:  Right wrist 09/25/2016 FINDINGS: Acute fracture of the right radial neck with mild angulation. No definite intra-articular extension on the views obtained. Linear nondisplaced fracture through the olecranon portion of the proximal ulna with extension to the articular surface. No  dislocation of the elbow joint is suggested although dedicated elbow views would be useful in complete evaluation. There is deformity and sclerosis of the distal right radius which appears similar to previous study suggesting an old fracture deformity. Old ununited ossicle at the ulnar styloid process. Degenerative changes in the carpus. Diffuse bone demineralization. IMPRESSION: Acute fractures of the right radial neck and proximal ulna. Ulnar fractures extend to the articular surface. Old fracture deformities of the distal right radius and ulna. Electronically Signed   By: Lucienne Capers M.D.   On: 02/17/2017 21:52   Ct Head Wo Contrast  Result Date: 02/17/2017 CLINICAL DATA:  Fall earlier. Head trauma. No loss of consciousness. EXAM: CT HEAD WITHOUT CONTRAST TECHNIQUE: Contiguous axial images were obtained from the base of  the skull through the vertex without intravenous contrast. COMPARISON:  11/01/2016 FINDINGS: Brain: Diffuse cerebral atrophy. Mild ventricular dilatation consistent with central atrophy. Low-attenuation changes throughout the deep white matter consistent with small vessel ischemia. No mass effect or midline shift. No abnormal extra-axial fluid collections. Gray-white matter junctions are distinct. Basal cisterns are not effaced. No acute intracranial hemorrhage. Vascular: Vascular calcifications are demonstrated in the internal carotid arteries. Skull: Calvarium appears intact. Sinuses/Orbits: Right cochlear implant with streak artifact limiting visualization of some areas. Postoperative changes in the right temporal bone. Visualized paranasal sinuses are clear. Other: No significant changes since previous study. IMPRESSION: No acute intracranial abnormalities. Chronic atrophy and small vessel ischemic changes. Electronically Signed   By: Lucienne Capers M.D.   On: 02/17/2017 22:10   Dg Humerus Right  Result Date: 02/17/2017 CLINICAL DATA:  Right arm pain and deformity after a fall. EXAM: RIGHT HUMERUS - 2+ VIEW COMPARISON:  Right shoulder 09/25/2016 FINDINGS: Diffuse bone demineralization. Comminuted and impacted fractures of the surgical neck of the right humerus. Fracture lines extend along the lateral aspect of the humeral head with displaced greater tuberosity fragment. Mild lateral angulation of the distal fracture fragment. Degenerative changes in the glenohumeral joint. Subacromial spurring. 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 Midshaft and distal humerus appear intact. IMPRESSION: Comminuted and impacted fractures of the surgical neck of the right humerus with fracture lines extending along the lateral aspect of the humeral head with displaced greater tuberosity fragment. Chronic degenerative changes in the  glenohumeral and acromioclavicular joints. Subacromial calcifications suggesting calcific tendinosis. Electronically Signed   By: Lucienne Capers M.D.   On: 02/17/2017 21:49   Dg Femur Min 2 Views Left  Result Date: 02/17/2017 CLINICAL DATA:  Pain and deformity of the left femur after a fall. EXAM: LEFT FEMUR 2 VIEWS COMPARISON:  None. FINDINGS: Acute comminuted fractures of the inter trochanteric and sub trochanteric left hip. Displaced lesser trochanteric fragment. Displaced fragment off of the inferior aspect of the greater trochanter extending to the sub trochanteric region. Mild varus angulation. No dislocation of the left hip. Degenerative changes in the left hip. Old fracture deformities of the pubic rami. Midshaft and distal femur appear intact. Diffuse bone demineralization. IMPRESSION: Acute comminuted fractures of the inter trochanteric and sub trochanteric left hip with displaced lesser trochanteric fragment. Electronically Signed   By: Lucienne Capers M.D.   On: 02/17/2017 22:05    Positive ROS: All other systems have been reviewed and were otherwise negative with the exception of those mentioned in the HPI and as above.  Objective: Labs cbc  Recent Labs  02/17/17 2207 02/18/17 0302  WBC 13.5* 13.3*  HGB 9.8*  8.2*  HCT 31.4* 26.5*  PLT 241 276    Labs inflam No results for input(s): CRP in the last 72 hours.  Invalid input(s): ESR  Labs coag No results for input(s): INR, PTT in the last 72 hours.  Invalid input(s): PT   Recent Labs  02/17/17 2207 02/18/17 0302  NA 135 138  K 4.6 4.6  CL 100* 105  CO2 22 21*  GLUCOSE 508* 481*  BUN 27* 28*  CREATININE 1.46* 1.51*  CALCIUM 8.5* 8.2*    Physical Exam: Vitals:   02/18/17 0251 02/18/17 0418  BP: (!) 154/59 136/61  Pulse: (!) 110 89  Resp: (!) 22 19  Temp: 98.6 F (37 C) 98.6 F (37 C)  SpO2: 96% 96%   General: no acute distress Mental status: demented, not interactive Respiratory: No cyanosis, no  use of accessory musculature Cardiovascular: No pedal edema GI: Abdomen is soft and non-tender, non-distended. Skin: Warm and dry.  No lesions in the area of chief complaint Extremities: Warm and well perfused w/o edema Psychiatric: Patient is not competent for consent   MUSCULOSKELETAL:  Left lower extremity flexed at the hip. Pain with range of motion. Sensation and motor function difficult to assess as patient is severely demented and not interactive. She does move her feet and toes. Right upper extremity pain with any range of motion.  Sensorimotor exam again difficult to assess due to patient's dementia. She is not interactive and there is significant pain with any range of motion in the elbow. There are no lesions here. Elbow is moderately swollen. Other extremities are atraumatic with painless ROM and NVI.   Prudencio Burly III PA-C 02/18/2017 7:32 AM

## 2017-02-18 NOTE — Progress Notes (Signed)
Orthopedic Tech Progress Note Patient Details:  Sheena [name] [birth year] [id number]  Ortho Devices Type of Ortho Device: Sling immobilizer Ortho Device/Splint Location: rue Ortho Device/Splint Interventions: Application   Taja Pentland 02/18/2017, 8:19 AM

## 2017-02-18 NOTE — Anesthesia Procedure Notes (Signed)
Procedure Name: Intubation Date/Time: 02/18/2017 2:46 PM Performed by: Myna Bright Pre-anesthesia Checklist: Patient identified, Emergency Drugs available, Suction available and Patient being monitored Patient Re-evaluated:Patient Re-evaluated prior to induction Oxygen Delivery Method: Circle system utilized Preoxygenation: Pre-oxygenation with 100% oxygen Induction Type: IV induction Ventilation: Mask ventilation without difficulty Laryngoscope Size: Mac and 3 Grade View: Grade I Tube type: Oral Tube size: 7.5 mm Number of attempts: 1 Airway Equipment and Method: Stylet Placement Confirmation: ETT inserted through vocal cords under direct vision,  positive ETCO2 and breath sounds checked- equal and bilateral Secured at: 21 cm Tube secured with: Tape Dental Injury: Teeth and Oropharynx as per pre-operative assessment

## 2017-02-18 NOTE — ED Notes (Signed)
PureWick placed for pt comfort

## 2017-02-18 NOTE — H&P (View-Only) (Signed)
ORTHOPAEDIC CONSULTATION  REQUESTING PHYSICIAN: Debbe Odea, MD  Chief Complaint: right upper extremity and left hip pain after mechanical fall  Assessment / Plan: Principal Problem:   Closed intertrochanteric fracture of left femur ([place]) Active Problems:   Dementia   Hypothyroidism   Diabetes mellitus, type 2 ([place])   Fall   Hip fracture ([place])   Closed fracture of right proximal radius and ulna   Closed fracture of proximal end of right humerus  Closed left intertrochanteric femur fracture Plan for Operative fixation today  -NPO  -Medicine team to admit and perform pre-op clearance -Type and Cross for 2 units held for OR -PT/OT post op -Will ammend WB status postop, bedrest for now -Foley for comfort to be removed POD 1/2 -Likely to require Rehab or SNF placement upon discharge.  -VTE prophylaxis: SCDs  Closed right proximal humerus, radius, and ulna fractures: Plan for nonoperative management at this time. Sling for comfort. Nonweightbearing right upper extremity  HPI: Sheena [name] is a 76 y.o. female who complains of left hip and right upper extremity pain after mechanical fall. She presented to the emergency department where imaging shows a closed left intertrochanteric femur fracture and right proximal humerus, radius, and ulna fractures. Orthopedics was consulted for evaluation.  The patient, per her son does mobilize some with therapy. The risks benefits and alternatives of the procedure were discussed with the patient's son.  He verbalizes understanding and wishes to proceed.     Past Medical History:  Diagnosis Date  . Dementia   . Diabetes mellitus   . Hypertension    Past Surgical History:  Procedure Laterality Date  . BACK SURGERY    . INNER EAR SURGERY    . NOSE SURGERY     Social History   Social History  . Marital status: Widowed    Spouse name: N/A  . Number of children: N/A  . Years of education: N/A   Social History Main Topics    . Smoking status: Never Smoker  . Smokeless tobacco: Never Used  . Alcohol use No  . Drug use: No  . Sexual activity: Not Currently   Other Topics Concern  . None   Social History Narrative  . None   Family History  Problem Relation Age of Onset  . Dementia Mother    Allergies  Allergen Reactions  . Bactrim Nausea And Vomiting   Prior to Admission medications   Medication Sig Start Date End Date Taking? Authorizing Provider  acetaminophen (TYLENOL) 325 MG tablet Take 650 mg by mouth every 4 (four) hours as needed (pain).   Yes [provider]  aspirin EC 81 MG tablet Take 81 mg by mouth daily.     Yes [provider]  Calcium Carbonate-Vitamin D (CALCIUM-VITAMIN D) 600-200 MG-UNIT CAPS Take 1 tablet by mouth daily.   Yes [provider]  cholecalciferol (VITAMIN D) 1000 UNITS tablet Take 1,000 Units by mouth daily.   Yes [provider]  divalproex (DEPAKOTE SPRINKLE) 125 MG capsule Take 1 capsule (125 mg total) by mouth every 12 (twelve) hours. 12/23/12  Yes Wenda Low, MD  donepezil (ARICEPT) 10 MG tablet Take 10 mg by mouth at bedtime.     Yes [provider]  fish oil-omega-3 fatty acids 1000 MG capsule Take 1 g by mouth daily.   Yes [provider]  ibuprofen (ADVIL,MOTRIN) 200 MG tablet Take 600 mg by mouth every 6 (six) hours as needed (pain). pain  Yes [provider]  insulin aspart (NOVOLOG) 100 UNIT/ML injection Inject 0-10 Units into the skin See admin instructions. Inject 0-10 units subcutaneously before meals and at bedtime per sliding scale: CBG 60-149 0 units, 150-250 5 units, 251-300 8 units, 301-350 10 units, >350 notify provider   Yes [provider]  insulin glargine (LANTUS) 100 UNIT/ML injection Inject 10 Units into the skin at bedtime.   Yes [provider]  levothyroxine (SYNTHROID, LEVOTHROID) 100 MCG tablet Take 100 mcg by mouth daily before breakfast.   Yes [provider]  lovastatin (MEVACOR) 20 MG tablet Take 20 mg by mouth at bedtime.   Yes [provider]  magnesium oxide (MAG-OX) 400 MG tablet Take 200 mg by mouth daily.   Yes [provider]  mirtazapine (REMERON) 7.5 MG tablet Take 7.5 mg by mouth at bedtime.     Yes [provider]  Multiple Vitamin (MULITIVITAMIN WITH MINERALS) TABS Take 1 tablet by mouth every evening.    Yes [provider]  potassium chloride (K-DUR,KLOR-CON) 10 MEQ tablet Take 1 tablet (10 mEq total) by mouth 2 (two) times daily. 12/23/12  Yes Wenda Low, MD  QUEtiapine (SEROQUEL) 50 MG tablet Take 1 tablet (50 mg total) by mouth at bedtime. 12/23/12  Yes Wenda Low, MD  vitamin B-12 (CYANOCOBALAMIN) 1000 MCG tablet Take 1,000 mcg by mouth every evening.    Yes [provider]  vitamin C (ASCORBIC ACID) 500 MG tablet Take 500 mg by mouth every evening.    Yes [provider]  magnesium chloride (SLOW-MAG) 64 MG TBEC Take 2 tablets (128 mg total) by mouth daily. Patient not taking: Reported on 02/17/2017 12/23/12   Wenda Low, MD   Dg Chest 1 View  Result Date: 02/17/2017 CLINICAL DATA:  Pain after a fall. EXAM: CHEST 1 VIEW COMPARISON:  12/16/2012 FINDINGS: Shallow inspiration. Heart size and pulmonary vascularity are normal. Lungs are clear and expanded. No blunting of costophrenic angles. No pneumothorax. Calcified and tortuous aorta. Surgical clips in the base of the neck. Old right rib fractures. IMPRESSION: No evidence of active pulmonary disease. Electronically Signed   By: Lucienne Capers M.D.   On: 02/17/2017 22:06   Dg Pelvis 1-2 Views  Result Date: 02/17/2017 CLINICAL DATA:  [place] out of the chair. Deformity of left femur and right arm pain. EXAM: PELVIS - 1-2 VIEW COMPARISON:  09/25/2016 FINDINGS: Acute inter trochanteric fracture of the left proximal femur extending to the sub trochanteric region laterally. Displaced lesser trochanteric fragment with  displaced lateral infra trochanteric fragment. No dislocation of the hip joint. Old fracture deformities of the superior and inferior pubic rami bilaterally are unchanged since previous study. Calcification along the right greater trochanter is also unchanged. Diffuse bone demineralization. Degenerative changes in the lower lumbar spine and in both hips. SI joints and symphysis pubis are not displaced. Visualized sacrum appears intact. IMPRESSION: Acute comminuted inter trochanteric and sub trochanteric fracture of the left proximal femur. Diffuse bone demineralization and old fracture deformities as described. Electronically Signed   By: Lucienne Capers M.D.   On: 02/17/2017 21:48   Dg Forearm Right  Result Date: 02/17/2017 CLINICAL DATA:  Right arm pain after a fall. EXAM: RIGHT FOREARM - 2 VIEW COMPARISON:  Right wrist 09/25/2016 FINDINGS: Acute fracture of the right radial neck with mild angulation. No definite intra-articular extension on the views obtained. Linear nondisplaced fracture through the olecranon portion of the proximal ulna with extension to the articular surface. No  dislocation of the elbow joint is suggested although dedicated elbow views would be useful in complete evaluation. There is deformity and sclerosis of the distal right radius which appears similar to previous study suggesting an old fracture deformity. Old ununited ossicle at the ulnar styloid process. Degenerative changes in the carpus. Diffuse bone demineralization. IMPRESSION: Acute fractures of the right radial neck and proximal ulna. Ulnar fractures extend to the articular surface. Old fracture deformities of the distal right radius and ulna. Electronically Signed   By: Lucienne Capers M.D.   On: 02/17/2017 21:52   Ct Head Wo Contrast  Result Date: 02/17/2017 CLINICAL DATA:  Fall earlier. Head trauma. No loss of consciousness. EXAM: CT HEAD WITHOUT CONTRAST TECHNIQUE: Contiguous axial images were obtained from the base of  the skull through the vertex without intravenous contrast. COMPARISON:  11/01/2016 FINDINGS: Brain: Diffuse cerebral atrophy. Mild ventricular dilatation consistent with central atrophy. Low-attenuation changes throughout the deep white matter consistent with small vessel ischemia. No mass effect or midline shift. No abnormal extra-axial fluid collections. Gray-white matter junctions are distinct. Basal cisterns are not effaced. No acute intracranial hemorrhage. Vascular: Vascular calcifications are demonstrated in the internal carotid arteries. Skull: Calvarium appears intact. Sinuses/Orbits: Right cochlear implant with streak artifact limiting visualization of some areas. Postoperative changes in the right temporal bone. Visualized paranasal sinuses are clear. Other: No significant changes since previous study. IMPRESSION: No acute intracranial abnormalities. Chronic atrophy and small vessel ischemic changes. Electronically Signed   By: Lucienne Capers M.D.   On: 02/17/2017 22:10   Dg Humerus Right  Result Date: 02/17/2017 CLINICAL DATA:  Right arm pain and deformity after a fall. EXAM: RIGHT HUMERUS - 2+ VIEW COMPARISON:  Right shoulder 09/25/2016 FINDINGS: Diffuse bone demineralization. Comminuted and impacted fractures of the surgical neck of the right humerus. Fracture lines extend along the lateral aspect of the humeral head with displaced greater tuberosity fragment. Mild lateral angulation of the distal fracture fragment. Degenerative changes in the glenohumeral joint. Subacromial spurring. 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 Midshaft and distal humerus appear intact. IMPRESSION: Comminuted and impacted fractures of the surgical neck of the right humerus with fracture lines extending along the lateral aspect of the humeral head with displaced greater tuberosity fragment. Chronic degenerative changes in the  glenohumeral and acromioclavicular joints. Subacromial calcifications suggesting calcific tendinosis. Electronically Signed   By: Lucienne Capers M.D.   On: 02/17/2017 21:49   Dg Femur Min 2 Views Left  Result Date: 02/17/2017 CLINICAL DATA:  Pain and deformity of the left femur after a fall. EXAM: LEFT FEMUR 2 VIEWS COMPARISON:  None. FINDINGS: Acute comminuted fractures of the inter trochanteric and sub trochanteric left hip. Displaced lesser trochanteric fragment. Displaced fragment off of the inferior aspect of the greater trochanter extending to the sub trochanteric region. Mild varus angulation. No dislocation of the left hip. Degenerative changes in the left hip. Old fracture deformities of the pubic rami. Midshaft and distal femur appear intact. Diffuse bone demineralization. IMPRESSION: Acute comminuted fractures of the inter trochanteric and sub trochanteric left hip with displaced lesser trochanteric fragment. Electronically Signed   By: Lucienne Capers M.D.   On: 02/17/2017 22:05    Positive ROS: All other systems have been reviewed and were otherwise negative with the exception of those mentioned in the HPI and as above.  Objective: Labs cbc  Recent Labs  02/17/17 2207 02/18/17 0302  WBC 13.5* 13.3*  HGB 9.8*  8.2*  HCT 31.4* 26.5*  PLT 241 276    Labs inflam No results for input(s): CRP in the last 72 hours.  Invalid input(s): ESR  Labs coag No results for input(s): INR, PTT in the last 72 hours.  Invalid input(s): PT   Recent Labs  02/17/17 2207 02/18/17 0302  NA 135 138  K 4.6 4.6  CL 100* 105  CO2 22 21*  GLUCOSE 508* 481*  BUN 27* 28*  CREATININE 1.46* 1.51*  CALCIUM 8.5* 8.2*    Physical Exam: Vitals:   02/18/17 0251 02/18/17 0418  BP: (!) 154/59 136/61  Pulse: (!) 110 89  Resp: (!) 22 19  Temp: 98.6 F (37 C) 98.6 F (37 C)  SpO2: 96% 96%   General: no acute distress Mental status: demented, not interactive Respiratory: No cyanosis, no  use of accessory musculature Cardiovascular: No pedal edema GI: Abdomen is soft and non-tender, non-distended. Skin: Warm and dry.  No lesions in the area of chief complaint Extremities: Warm and well perfused w/o edema Psychiatric: Patient is not competent for consent   MUSCULOSKELETAL:  Left lower extremity flexed at the hip. Pain with range of motion. Sensation and motor function difficult to assess as patient is severely demented and not interactive. She does move her feet and toes. Right upper extremity pain with any range of motion.  Sensorimotor exam again difficult to assess due to patient's dementia. She is not interactive and there is significant pain with any range of motion in the elbow. There are no lesions here. Elbow is moderately swollen. Other extremities are atraumatic with painless ROM and NVI.   Prudencio Burly III PA-C 02/18/2017 7:32 AM

## 2017-02-18 NOTE — Progress Notes (Addendum)
PROGRESS NOTE    Sheena [name]   ZOX:[id number]  DOB: 1940/08/22  DOA: 02/17/2017 PCP: Georgann Housekeeper, MD   Brief Narrative:  Sheena [name] is a 76 y/o from [place] [facility] with dementia, frequent falls, hypothyroidism, HTN, very hard of hearing who presents to the ER after a fall while getting out of her wheelchair.  Found to have  - comminuted and impacted fracture of the surgical neck of the right humerus - acute fractures right radial neck and proximal ulnar - acute comminuted fractures of the intertrochanteric and subtrochanteric left femur.  Admitted 4/17-4/20 for right wrist fx 5/24- brought to ER again after fall and a laceration to the scalp  Subjective: Sleepy. Awakens only briefly and does not communicate. Son at bedside states that Fentanyl does this to her.     Assessment & Plan:   Principal Problem:   Closed intertrochanteric fracture of left femur  - plan for surgery today and then discharge back to SNF - moderate risk for surgery - received 50 mgc of Fentanyl at midnight- cut back to 12.5 while NPO and change to oral after surgery - d/c Morphine  Active Problems:   Closed fracture of right proximal radius and ulna   Closed fracture of proximal end of right humerus - nonoperative management at this time. - Sling for comfort. - Nonweightbearing right upper extremity    Dementia - Aricept and Depakote    Hypothyroidism - Synthroid    Diabetes mellitus, type 2  - a1c 6.8 in 4/18- sugars now 300-400s despite not eating - on Lantus 10 U and ISS  - cont SSI today - will give another dose of Lantus 10 U now- received on in the middle of the night  AKI/ dehydration - cont IVF and follow  DVT prophylaxis: per ortho Code Status: previously DNR but is full code for OR today- change to DNR after surgery Family Communication:  Disposition Plan: SNF tomorrow Consultants:   ortho Procedures:    Antimicrobials:  Anti-infectives    None        Objective: Vitals:   02/18/17 0115 02/18/17 0145 02/18/17 0251 02/18/17 0418  BP: (!) 192/96 (!) 169/86 (!) 154/59 136/61  Pulse: (!) 110 (!) 107 (!) 110 89  Resp: 11 14 (!) 22 19  Temp:  98.6 F (37 C) 98.6 F (37 C) 98.6 F (37 C)  TempSrc:   Oral Oral  SpO2: 97% 100% 96% 96%    Intake/Output Summary (Last 24 hours) at 02/18/17 0853 Last data filed at 02/18/17 0400  Gross per 24 hour  Intake            88.33 ml  Output                0 ml  Net            88.33 ml   There were no vitals filed for this visit.  Examination: General exam: Appears comfortable - sleepy HEENT: PERRLA, oral mucosa moist, no sclera icterus or thrush Respiratory system: Clear to auscultation. Respiratory effort normal. Cardiovascular system: S1 & S2 heard, RRR.  No murmurs  Gastrointestinal system: Abdomen soft, non-tender, nondistended. Normal bowel sound. No organomegaly Central nervous system: Alert and oriented. No focal neurological deficits. Extremities:  right arm in sling Skin: No rashes or ulcers Psychiatry:  cannot assess    Data Reviewed: I have personally reviewed following labs and imaging studies  CBC:  Recent Labs Lab 02/17/17 2207 02/18/17 0302  WBC 13.5* 13.3*  NEUTROABS 11.1*  --   HGB 9.8* 8.2*  HCT 31.4* 26.5*  MCV 89.0 89.2  PLT 241 276   Basic Metabolic Panel:  Recent Labs Lab 02/17/17 2207 02/18/17 0302  NA 135 138  K 4.6 4.6  CL 100* 105  CO2 22 21*  GLUCOSE 508* 481*  BUN 27* 28*  CREATININE 1.46* 1.51*  CALCIUM 8.5* 8.2*   GFR: CrCl cannot be calculated (Unknown ideal weight.). Liver Function Tests:  Recent Labs Lab 02/17/17 2207  AST 22  ALT 19  ALKPHOS 127*  BILITOT 1.1  PROT 6.0*  ALBUMIN 3.3*   No results for input(s): LIPASE, AMYLASE in the last 168 hours. No results for input(s): AMMONIA in the last 168 hours. Coagulation Profile: No results for input(s): INR, PROTIME in the last 168 hours. Cardiac Enzymes: No results  for input(s): CKTOTAL, CKMB, CKMBINDEX, TROPONINI in the last 168 hours. BNP (last 3 results) No results for input(s): PROBNP in the last 8760 hours. HbA1C: No results for input(s): HGBA1C in the last 72 hours. CBG:  Recent Labs Lab 02/18/17 0201 02/18/17 0246  GLUCAP 390* 434*   Lipid Profile: No results for input(s): CHOL, HDL, LDLCALC, TRIG, CHOLHDL, LDLDIRECT in the last 72 hours. Thyroid Function Tests: No results for input(s): TSH, T4TOTAL, FREET4, T3FREE, THYROIDAB in the last 72 hours. Anemia Panel: No results for input(s): VITAMINB12, FOLATE, FERRITIN, TIBC, IRON, RETICCTPCT in the last 72 hours. Urine analysis:    Component Value Date/Time   COLORURINE YELLOW 02/18/2017 0530   APPEARANCEUR HAZY (A) 02/18/2017 0530   LABSPEC 1.027 02/18/2017 0530   PHURINE 6.0 02/18/2017 0530   GLUCOSEU >=500 (A) 02/18/2017 0530   HGBUR NEGATIVE 02/18/2017 0530   BILIRUBINUR NEGATIVE 02/18/2017 0530   KETONESUR 20 (A) 02/18/2017 0530   PROTEINUR NEGATIVE 02/18/2017 0530   UROBILINOGEN 1.0 12/16/2012 1937   NITRITE POSITIVE (A) 02/18/2017 0530   LEUKOCYTESUR NEGATIVE 02/18/2017 0530   Sepsis Labs: (procalcitonin:4,lacticidven:4) ) Recent Results (from the past 240 hour(s))  Surgical pcr screen     Status: None   Collection Time: 02/18/17  3:34 AM  Result Value Ref Range Status   MRSA, PCR NEGATIVE NEGATIVE Final   Staphylococcus aureus NEGATIVE NEGATIVE Final    Comment: (NOTE) The Xpert SA Assay (FDA approved for NASAL specimens in patients 39 years of age and older), is one component of a comprehensive surveillance program. It is not intended to diagnose infection nor to guide or monitor treatment.          Radiology Studies: Dg Chest 1 View  Result Date: 02/17/2017 CLINICAL DATA:  Pain after a fall. EXAM: CHEST 1 VIEW COMPARISON:  12/16/2012 FINDINGS: Shallow inspiration. Heart size and pulmonary vascularity are normal. Lungs are clear and expanded. No  blunting of costophrenic angles. No pneumothorax. Calcified and tortuous aorta. Surgical clips in the base of the neck. Old right rib fractures. IMPRESSION: No evidence of active pulmonary disease. Electronically Signed   By: Burman Nieves M.D.   On: 02/17/2017 22:06   Dg Pelvis 1-2 Views  Result Date: 02/17/2017 CLINICAL DATA:  [place] out of the chair. Deformity of left femur and right arm pain. EXAM: PELVIS - 1-2 VIEW COMPARISON:  09/25/2016 FINDINGS: Acute inter trochanteric fracture of the left proximal femur extending to the sub trochanteric region laterally. Displaced lesser trochanteric fragment with displaced lateral infra trochanteric fragment. No dislocation of the hip joint. Old fracture deformities of the superior and inferior pubic rami bilaterally are  unchanged since previous study. Calcification along the right greater trochanter is also unchanged. Diffuse bone demineralization. Degenerative changes in the lower lumbar spine and in both hips. SI joints and symphysis pubis are not displaced. Visualized sacrum appears intact. IMPRESSION: Acute comminuted inter trochanteric and sub trochanteric fracture of the left proximal femur. Diffuse bone demineralization and old fracture deformities as described. Electronically Signed   By: Burman Nieves M.D.   On: 02/17/2017 21:48   Dg Forearm Right  Result Date: 02/17/2017 CLINICAL DATA:  Right arm pain after a fall. EXAM: RIGHT FOREARM - 2 VIEW COMPARISON:  Right wrist 09/25/2016 FINDINGS: Acute fracture of the right radial neck with mild angulation. No definite intra-articular extension on the views obtained. Linear nondisplaced fracture through the olecranon portion of the proximal ulna with extension to the articular surface. No dislocation of the elbow joint is suggested although dedicated elbow views would be useful in complete evaluation. There is deformity and sclerosis of the distal right radius which appears similar to previous study  suggesting an old fracture deformity. Old ununited ossicle at the ulnar styloid process. Degenerative changes in the carpus. Diffuse bone demineralization. IMPRESSION: Acute fractures of the right radial neck and proximal ulna. Ulnar fractures extend to the articular surface. Old fracture deformities of the distal right radius and ulna. Electronically Signed   By: Burman Nieves M.D.   On: 02/17/2017 21:52   Ct Head Wo Contrast  Result Date: 02/17/2017 CLINICAL DATA:  Fall earlier. Head trauma. No loss of consciousness. EXAM: CT HEAD WITHOUT CONTRAST TECHNIQUE: Contiguous axial images were obtained from the base of the skull through the vertex without intravenous contrast. COMPARISON:  11/01/2016 FINDINGS: Brain: Diffuse cerebral atrophy. Mild ventricular dilatation consistent with central atrophy. Low-attenuation changes throughout the deep white matter consistent with small vessel ischemia. No mass effect or midline shift. No abnormal extra-axial fluid collections. Gray-white matter junctions are distinct. Basal cisterns are not effaced. No acute intracranial hemorrhage. Vascular: Vascular calcifications are demonstrated in the internal carotid arteries. Skull: Calvarium appears intact. Sinuses/Orbits: Right cochlear implant with streak artifact limiting visualization of some areas. Postoperative changes in the right temporal bone. Visualized paranasal sinuses are clear. Other: No significant changes since previous study. IMPRESSION: No acute intracranial abnormalities. Chronic atrophy and small vessel ischemic changes. Electronically Signed   By: Burman Nieves M.D.   On: 02/17/2017 22:10   Dg Humerus Right  Result Date: 02/17/2017 CLINICAL DATA:  Right arm pain and deformity after a fall. EXAM: RIGHT HUMERUS - 2+ VIEW COMPARISON:  Right shoulder 09/25/2016 FINDINGS: Diffuse bone demineralization. Comminuted and impacted fractures of the surgical neck of the right humerus. Fracture lines extend along  the lateral aspect of the humeral head with displaced greater tuberosity fragment. Mild lateral angulation of the distal fracture fragment. Degenerative changes in the glenohumeral joint. Subacromial spurring. 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 Midshaft and distal humerus appear intact. IMPRESSION: Comminuted and impacted fractures of the surgical neck of the right humerus with fracture lines extending along the lateral aspect of the humeral head with displaced greater tuberosity fragment. Chronic degenerative changes in the glenohumeral and acromioclavicular joints. Subacromial calcifications suggesting calcific tendinosis. Electronically Signed   By: Burman Nieves M.D.   On: 02/17/2017 21:49   Dg Femur Min 2 Views Left  Result Date: 02/17/2017 CLINICAL DATA:  Pain and deformity of the left femur after a fall. EXAM: LEFT FEMUR 2 VIEWS COMPARISON:  None. FINDINGS:  Acute comminuted fractures of the inter trochanteric and sub trochanteric left hip. Displaced lesser trochanteric fragment. Displaced fragment off of the inferior aspect of the greater trochanter extending to the sub trochanteric region. Mild varus angulation. No dislocation of the left hip. Degenerative changes in the left hip. Old fracture deformities of the pubic rami. Midshaft and distal femur appear intact. Diffuse bone demineralization. IMPRESSION: Acute comminuted fractures of the inter trochanteric and sub trochanteric left hip with displaced lesser trochanteric fragment. Electronically Signed   By: Burman NievesWilliam  Stevens M.D.   On: 02/17/2017 22:05      Scheduled Meds: . insulin aspart  0-9 Units Subcutaneous Q4H  . insulin aspart  5 Units Intravenous Once  . insulin glargine  10 Units Subcutaneous QHS   Continuous Infusions: . sodium chloride 100 mL/hr at 02/18/17 0307     LOS: 0 days    Time spent in minutes: 35    Calvert CantorSaima Mersades Barbaro,  MD [facility] Pager: [email or web address] Password [email or web address] 02/18/2017, 8:53 AM

## 2017-02-18 NOTE — H&P (Signed)
History and Physical    PIERRE CUMPTON ZOX:[id number] DOB: Aug 21, 1940 DOA: 02/17/2017  PCP: Georgann Housekeeper, MD   Patient coming from: [facility]  Chief Complaint: Marletta Lor  HPI: Sheena [name] is a 76 y.o. female with medical history significant for- dementia, prior falls, hypothyroidism, hypertension. History is obtained mostly from chart review and patient's son, as patient has dementia, extremely hard of hearing- hearing aids missing and so unable to provide me history, and also in pain. Patient presented to the ED from her nursing home, after patient sustained a fall. Patient got out of her wheelchair and fell. At baseline patient uses a wheelchair, and a walker when she is working with physical therapy. The patient's son is not aware any difficulty breathing or chest pain, shortness of breath or diarrhea, vomiting. Due to her dementia, Patient's son reports is not unusual for patient to get up without her wheelchair.  At baseline- patient son says most time patient, remember how he is, can answer simple questions sometimes. Patient has no cardiac history.  ED Course: Blood pressure elevated 160s to 170s, also tachycardia, normal temperature, O2 sats 93-96% on room air. Blood work showed mild leukocytosis- to 13.5, WBC 9.8 about baseline, normal electrolytes, but creatinine was elevated at 1.4. X-rays show multiple fractures - comminuted and impacted fracture of the surgical neck of the right humerus, acute fractures right radial neck and proximal ulnar, acute comminuted fractures of the intertrochanteric and subtrochanteric left femur. Chest x-ray negative for acute abnormality. Head CT- negative for acute abnormality. Patient was given 5 units NovoLog 10 units Lantus. ED providers talked to Dr. Eulah Pont- recommended hospitalist admit and will take care of all patients fractures in the morning.  Review of Systems: Unable to obtain due to patient's mental status, significant dementia.  Past  Medical History:  Diagnosis Date  . Dementia   . Diabetes mellitus   . Hypertension     Past Surgical History:  Procedure Laterality Date  . BACK SURGERY    . INNER EAR SURGERY    . NOSE SURGERY       reports that she has never smoked. She has never used smokeless tobacco. She reports that she does not drink alcohol or use drugs.  Allergies  Allergen Reactions  . Bactrim Nausea And Vomiting    Family History  Problem Relation Age of Onset  . Dementia Mother     Prior to Admission medications   Medication Sig Start Date End Date Taking? Authorizing Provider  acetaminophen (TYLENOL) 325 MG tablet Take 650 mg by mouth every 4 (four) hours as needed (pain).   Yes [provider]  aspirin EC 81 MG tablet Take 81 mg by mouth daily.     Yes [provider]  Calcium Carbonate-Vitamin D (CALCIUM-VITAMIN D) 600-200 MG-UNIT CAPS Take 1 tablet by mouth daily.   Yes [provider]  cholecalciferol (VITAMIN D) 1000 UNITS tablet Take 1,000 Units by mouth daily.   Yes [provider]  divalproex (DEPAKOTE SPRINKLE) 125 MG capsule Take 1 capsule (125 mg total) by mouth every 12 (twelve) hours. 12/23/12  Yes Georgann Housekeeper, MD  donepezil (ARICEPT) 10 MG tablet Take 10 mg by mouth at bedtime.     Yes [provider]  fish oil-omega-3 fatty acids 1000 MG capsule Take 1 g by mouth daily.   Yes [provider]  ibuprofen (ADVIL,MOTRIN) 200 MG tablet Take 600 mg by mouth every 6 (six) hours as needed (pain). pain  Yes [provider]  insulin aspart (NOVOLOG) 100 UNIT/ML injection Inject 0-10 Units into the skin See admin instructions. Inject 0-10 units subcutaneously before meals and at bedtime per sliding scale: CBG 60-149 0 units, 150-250 5 units, 251-300 8 units, 301-350 10 units, >350 notify provider   Yes [provider]  insulin glargine (LANTUS) 100 UNIT/ML injection Inject 10 Units into the skin at bedtime.   Yes  [provider]  levothyroxine (SYNTHROID, LEVOTHROID) 100 MCG tablet Take 100 mcg by mouth daily before breakfast.   Yes [provider]  lovastatin (MEVACOR) 20 MG tablet Take 20 mg by mouth at bedtime.   Yes [provider]  magnesium oxide (MAG-OX) 400 MG tablet Take 200 mg by mouth daily.   Yes [provider]  mirtazapine (REMERON) 7.5 MG tablet Take 7.5 mg by mouth at bedtime.     Yes [provider]  Multiple Vitamin (MULITIVITAMIN WITH MINERALS) TABS Take 1 tablet by mouth every evening.    Yes [provider]  potassium chloride (K-DUR,KLOR-CON) 10 MEQ tablet Take 1 tablet (10 mEq total) by mouth 2 (two) times daily. 12/23/12  Yes Georgann HousekeeperHusain, Karrar, MD  QUEtiapine (SEROQUEL) 50 MG tablet Take 1 tablet (50 mg total) by mouth at bedtime. 12/23/12  Yes Georgann HousekeeperHusain, Karrar, MD  vitamin B-12 (CYANOCOBALAMIN) 1000 MCG tablet Take 1,000 mcg by mouth every evening.    Yes [provider]  vitamin C (ASCORBIC ACID) 500 MG tablet Take 500 mg by mouth every evening.    Yes [provider]  magnesium chloride (SLOW-MAG) 64 MG TBEC Take 2 tablets (128 mg total) by mouth daily. Patient not taking: Reported on 02/17/2017 12/23/12   Georgann HousekeeperHusain, Karrar, MD    Physical Exam: Vitals:   02/17/17 2330 02/18/17 0000 02/18/17 0030 02/18/17 0100  BP: (!) 161/85 (!) 168/73 (!) 171/75 (!) 177/80  Pulse: (!) 114 (!) 113 (!) 111 (!) 114  Resp: 15 13 14 11   Temp:      SpO2: 96% 98% 95% 95%    Constitutional: NAD, Sleeping but arousable, started hollering that she is in pain Vitals:   02/17/17 2330 02/18/17 0000 02/18/17 0030 02/18/17 0100  BP: (!) 161/85 (!) 168/73 (!) 171/75 (!) 177/80  Pulse: (!) 114 (!) 113 (!) 111 (!) 114  Resp: 15 13 14 11   Temp:      SpO2: 96% 98% 95% 95%   Eyes: PERRL, lids and conjunctivae normal ENMT: Mucous membranes are moist. Unable to visualize posterior pharynx due to mental status Neck: normal, supple, no masses,  no thyromegaly Respiratory: clear to auscultation bilaterally, no wheezing, no crackles- anterior auscultation. Normal respiratory effort. No accessory muscle use.  Cardiovascular: Tachycardia, Regular rate and rhythm, no murmurs / rubs / gallops. No extremity edema. 2+ pedal pulses.   Abdomen: no tenderness, no masses palpated. No hepatosplenomegaly. Bowel sounds positive.  Musculoskeletal: no clubbing / cyanosis. Not moving right upper extremity or left lower extremity, no obvious bruising appreciated Skin: no rashes, lesions, ulcers. No induration Neurologic: CN 2-12 grossly intact. Sensation intact, DTR normal. Strength 5/5 in all 4.  Psychiatric: Normal judgment and insight. Alert and oriented x 3. Normal mood.   Labs on Admission: I have personally reviewed following labs and imaging studies  CBC:  Recent Labs Lab 02/17/17 2207  WBC 13.5*  NEUTROABS 11.1*  HGB 9.8*  HCT 31.4*  MCV 89.0  PLT 241   Basic Metabolic Panel:  Recent Labs Lab 02/17/17 2207  NA 135  K 4.6  CL 100*  CO2 22  GLUCOSE 508*  BUN 27*  CREATININE 1.46*  CALCIUM 8.5*   Liver Function Tests:  Recent Labs Lab 02/17/17 2207  AST 22  ALT 19  ALKPHOS 127*  BILITOT 1.1  PROT 6.0*  ALBUMIN 3.3*    Radiological Exams on Admission: Dg Chest 1 View  Result Date: 02/17/2017 CLINICAL DATA:  Pain after a fall. EXAM: CHEST 1 VIEW COMPARISON:  12/16/2012 FINDINGS: Shallow inspiration. Heart size and pulmonary vascularity are normal. Lungs are clear and expanded. No blunting of costophrenic angles. No pneumothorax. Calcified and tortuous aorta. Surgical clips in the base of the neck. Old right rib fractures. IMPRESSION: No evidence of active pulmonary disease. Electronically Signed   By: Burman Nieves M.D.   On: 02/17/2017 22:06   Dg Pelvis 1-2 Views  Result Date: 02/17/2017 CLINICAL DATA:  Larey Seat out of the chair. Deformity of left femur and right arm pain. EXAM: PELVIS - 1-2 VIEW COMPARISON:   09/25/2016 FINDINGS: Acute inter trochanteric fracture of the left proximal femur extending to the sub trochanteric region laterally. Displaced lesser trochanteric fragment with displaced lateral infra trochanteric fragment. No dislocation of the hip joint. Old fracture deformities of the superior and inferior pubic rami bilaterally are unchanged since previous study. Calcification along the right greater trochanter is also unchanged. Diffuse bone demineralization. Degenerative changes in the lower lumbar spine and in both hips. SI joints and symphysis pubis are not displaced. Visualized sacrum appears intact. IMPRESSION: Acute comminuted inter trochanteric and sub trochanteric fracture of the left proximal femur. Diffuse bone demineralization and old fracture deformities as described. Electronically Signed   By: Burman Nieves M.D.   On: 02/17/2017 21:48   Dg Forearm Right  Result Date: 02/17/2017 CLINICAL DATA:  Right arm pain after a fall. EXAM: RIGHT FOREARM - 2 VIEW COMPARISON:  Right wrist 09/25/2016 FINDINGS: Acute fracture of the right radial neck with mild angulation. No definite intra-articular extension on the views obtained. Linear nondisplaced fracture through the olecranon portion of the proximal ulna with extension to the articular surface. No dislocation of the elbow joint is suggested although dedicated elbow views would be useful in complete evaluation. There is deformity and sclerosis of the distal right radius which appears similar to previous study suggesting an old fracture deformity. Old ununited ossicle at the ulnar styloid process. Degenerative changes in the carpus. Diffuse bone demineralization. IMPRESSION: Acute fractures of the right radial neck and proximal ulna. Ulnar fractures extend to the articular surface. Old fracture deformities of the distal right radius and ulna. Electronically Signed   By: Burman Nieves M.D.   On: 02/17/2017 21:52   Ct Head Wo Contrast  Result  Date: 02/17/2017 CLINICAL DATA:  Fall earlier. Head trauma. No loss of consciousness. EXAM: CT HEAD WITHOUT CONTRAST TECHNIQUE: Contiguous axial images were obtained from the base of the skull through the vertex without intravenous contrast. COMPARISON:  11/01/2016 FINDINGS: Brain: Diffuse cerebral atrophy. Mild ventricular dilatation consistent with central atrophy. Low-attenuation changes throughout the deep white matter consistent with small vessel ischemia. No mass effect or midline shift. No abnormal extra-axial fluid collections. Gray-white matter junctions are distinct. Basal cisterns are not effaced. No acute intracranial hemorrhage. Vascular: Vascular calcifications are demonstrated in the internal carotid arteries. Skull: Calvarium appears intact. Sinuses/Orbits: Right cochlear implant with streak artifact limiting visualization of some areas. Postoperative changes in the right temporal bone. Visualized paranasal sinuses are clear. Other: No significant changes since previous study. IMPRESSION: No acute  intracranial abnormalities. Chronic atrophy and small vessel ischemic changes. Electronically Signed   By: Burman Nieves M.D.   On: 02/17/2017 22:10   Dg Humerus Right  Result Date: 02/17/2017 CLINICAL DATA:  Right arm pain and deformity after a fall. EXAM: RIGHT HUMERUS - 2+ VIEW COMPARISON:  Right shoulder 09/25/2016 FINDINGS: Diffuse bone demineralization. Comminuted and impacted fractures of the surgical neck of the right humerus. Fracture lines extend along the lateral aspect of the humeral head with displaced greater tuberosity fragment. Mild lateral angulation of the distal fracture fragment. Degenerative changes in the glenohumeral joint. Subacromial spurring. 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 Midshaft and distal humerus appear intact. IMPRESSION: Comminuted and impacted fractures of the surgical neck of  the right humerus with fracture lines extending along the lateral aspect of the humeral head with displaced greater tuberosity fragment. Chronic degenerative changes in the glenohumeral and acromioclavicular joints. Subacromial calcifications suggesting calcific tendinosis. Electronically Signed   By: Burman Nieves M.D.   On: 02/17/2017 21:49   Dg Femur Min 2 Views Left  Result Date: 02/17/2017 CLINICAL DATA:  Pain and deformity of the left femur after a fall. EXAM: LEFT FEMUR 2 VIEWS COMPARISON:  None. FINDINGS: Acute comminuted fractures of the inter trochanteric and sub trochanteric left hip. Displaced lesser trochanteric fragment. Displaced fragment off of the inferior aspect of the greater trochanter extending to the sub trochanteric region. Mild varus angulation. No dislocation of the left hip. Degenerative changes in the left hip. Old fracture deformities of the pubic rami. Midshaft and distal femur appear intact. Diffuse bone demineralization. IMPRESSION: Acute comminuted fractures of the inter trochanteric and sub trochanteric left hip with displaced lesser trochanteric fragment. Electronically Signed   By: Burman Nieves M.D.   On: 02/17/2017 22:05   EKG: Rate 10. Normal intervals. 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 EKG unchanged from prior.  Assessment/Plan Principal Problem:   Fall Active Problems:   Dementia   Hypothyroidism   Diabetes mellitus, type 2 (HCC)   Hip fracture (HCC)  Fall :. Witnessed. Appears to be a mechanical fall, possibly dehydration contributing, with AKI and hyperglycemia. Subsequent multiple fractures - right radius, right ulnar , right humerus , left femur .EDP talked to Dr. Eulah Pont on call for hand, to take care of all patient's fractures a.m. EKG is unchanged from prior- no Q waves or ST or T-wave changes - Nothing by mouth  - Follow-up or to consult a.m. - Morphine  Q4H - Order physical therapy when patient able - IVF n/s 100 cc/hr of 12 hrs - Using  the revised cardiac risk index calculator- patient has 1 point for pre-op insulin use --> resulting in a 0.9% risk of major cardiac event. Patient appears to be average risk for proposed surgery.  AKI- creatinine elevated at 1.4. Baseline 1-1.1. Likely related to hyperglycemia. - Hydrate  Leukocytosis- WBC 13. Chest x-ray negative. No fever. - Will obtain UA - CBC am  DM with hyperglycemia- glucose 508 >>390. After 5 units NovoLog given in ED. Last hemoglobin A1c on file 6.8. - Repeat NovoLog 5 units 1 - CBG every 4 hourly with SSI - Continue home Lantus 10 units daily  Elevated blood pressure- patient's son reports patient was on benazepril, this is not a medication list from nursing home. Uncontrolled pain, likely contributing to blood pressure elevation.  - When necessary labetalol  Hypothyroidism- home medications Synthroid 100 g daily held, while nothing by mouth  DVT prophylaxis: SCDs, for possible surgery,  Code Status: Full for Surgery, previously DNR. Family Communication: Patient's son at bedside Disposition Plan: To be determined Consults called: Hand surgery on-call- Dr. Eulah Pont Admission status: Inpatient medical floor   Onnie Boer MD [facility] Pager [phone]  If 2AM-7AM, please contact night-coverage [email or web address] Password [email or web address]  02/18/2017, 1:41 AM

## 2017-02-18 NOTE — Anesthesia Preprocedure Evaluation (Addendum)
Anesthesia Evaluation  Patient identified by MRN, date of birth, ID band Patient confused and Patient unresponsive    Reviewed: NPO status , Patient's Chart, lab work & pertinent test results, reviewed documented beta blocker date and time   Airway Mallampati: III  TM Distance: >3 FB   Mouth opening: Limited Mouth Opening  Dental  (+) Poor Dentition   Pulmonary neg pulmonary ROS,    + rhonchi        Cardiovascular hypertension, Pt. on home beta blockers Normal cardiovascular exam Rhythm:Regular Rate:Normal     Neuro/Psych negative neurological ROS     GI/Hepatic negative GI ROS,   Endo/Other  diabetes  Renal/GU Renal InsufficiencyRenal disease  negative genitourinary   Musculoskeletal negative musculoskeletal ROS (+)   Abdominal (+) + scaphoid   Peds  Hematology  (+) Blood dyscrasia, anemia ,   Anesthesia Other Findings   Reproductive/Obstetrics                             Anesthesia Physical Anesthesia Plan  ASA: IV  Anesthesia Plan: General   Post-op Pain Management:    Induction: Intravenous  PONV Risk Score and Plan: 3 and Ondansetron  Airway Management Planned: Oral ETT  Additional Equipment:   Intra-op Plan:   Post-operative Plan: Extubation in OR and Possible Post-op intubation/ventilation  Informed Consent: I have reviewed the patients History and Physical, chart, labs and discussed the procedure including the risks, benefits and alternatives for the proposed anesthesia with the patient or authorized representative who has indicated his/her understanding and acceptance.   Consent reviewed with POA and History available from chart only  Plan Discussed with: CRNA and Surgeon  Anesthesia Plan Comments: (Talked with patients POA and they have agreed to allow us to intubate the pt for surgery. We will not , however perform chest compressions should the patient  experience a cardiac arrest.)       Anesthesia Quick Evaluation

## 2017-02-18 NOTE — Progress Notes (Signed)
Orthopedic Tech Progress Note Patient Details:  Sheena [name] 11/17/1940 [id number]  Ortho Devices Type of Ortho Device: Sling immobilizer Ortho Device/Splint Location: rue Ortho Device/Splint Interventions: Application   [name] 02/18/2017, 8:19 AM  

## 2017-02-18 NOTE — Progress Notes (Signed)
Pt CBG at 0902 was 436. Dr. Butler Denmarkizwan at bedside during check, OK to give 9 units of correctional coverage. Also added 10 units of Lantus BID.

## 2017-02-18 NOTE — Interval H&P Note (Signed)
History and Physical Interval Note:  02/18/2017 2:31 PM  Sheena [name]  has presented today for surgery, with the diagnosis of Fractured Laft Hip  The various methods of treatment have been discussed with the patient and family. After consideration of risks, benefits and other options for treatment, the patient has consented to  Procedure(s): INTRAMEDULLARY (IM) NAIL FEMORAL (Left) as a surgical intervention .  The patient's history has been reviewed, patient examined, no change in status, stable for surgery.  I have reviewed the patient's chart and labs.  Questions were answered to the patient's satisfaction.     Mohit Zirbes D

## 2017-02-18 NOTE — Op Note (Signed)
DATE OF SURGERY:  02/18/2017  TIME: 2:40 PM  PATIENT NAME:  Sheena [name]  AGE: 76 y.o.  PRE-OPERATIVE DIAGNOSIS:  Fractured Laft Hip  POST-OPERATIVE DIAGNOSIS:  SAME  PROCEDURE:  INTRAMEDULLARY (IM) NAIL FEMORAL  SURGEON:  Glendal Cassaday D  ASSISTANT:  Ramond Marrowax Varkey MD  OPERATIVE IMPLANTS: Stryker Gamma Nail  PREOPERATIVE INDICATIONS:  [name] is a 76 y.o. year old who fell and suffered a hip fracture. She was brought into the ER and then admitted and optimized and then elected for surgical intervention.    The risks benefits and alternatives were discussed with the patient including but not limited to the risks of nonoperative treatment, versus surgical intervention including infection, bleeding, nerve injury, malunion, nonunion, hardware prominence, hardware failure, need for hardware removal, blood clots, cardiopulmonary complications, morbidity, mortality, among others, and they were willing to proceed.    OPERATIVE PROCEDURE:  The patient was brought to the operating room and placed in the supine position. General anesthesia was administered. She was placed on the fracture table.  Closed reduction was performed under C-arm guidance. Time out was then performed after sterile prep and drape. She received preoperative antibiotics.  Incision was made proximal to the greater trochanter. A guidewire was placed in the appropriate position. Confirmation was made on AP and lateral views. The above-named nail was opened. I opened the proximal femur with a reamer. I then placed the nail by hand easily down. I did not need to ream the femur.  Once the nail was completely seated, I placed a guidepin into the femoral head into the center center position. I measured the length, and then reamed the lateral cortex and up into the head. I then placed the lag screw. Slight compression was applied. Anatomic fixation achieved. Bone quality was mediocre.  I then secured the proximal interlocking  bolt, and took off a half a turn, and then removed the instruments, and took final C-arm pictures AP and lateral the entire length of the leg.  I then used perfect circles technique to place a distal interlock screw.   Anatomic reconstruction was achieved, and the wounds were irrigated copiously and closed with Vicryl followed by staples and sterile gauze for the skin. The patient was awakened and returned to PACU in stable and satisfactory condition. There no complications and the patient tolerated the procedure well.  She will be weightbearing as tolerated, and will be on chemical px  for a period of four weeks after discharge.   Margarita Ranaimothy Jolane Bankhead, M.D.

## 2017-02-19 ENCOUNTER — Encounter (HOSPITAL_COMMUNITY): Payer: Self-pay | Admitting: Orthopedic Surgery

## 2017-02-19 DIAGNOSIS — S52001D Unspecified fracture of upper end of right ulna, subsequent encounter for closed fracture with routine healing: Secondary | ICD-10-CM

## 2017-02-19 DIAGNOSIS — S42201D Unspecified fracture of upper end of right humerus, subsequent encounter for fracture with routine healing: Secondary | ICD-10-CM

## 2017-02-19 DIAGNOSIS — W19XXXD Unspecified fall, subsequent encounter: Secondary | ICD-10-CM

## 2017-02-19 DIAGNOSIS — S52101D Unspecified fracture of upper end of right radius, subsequent encounter for closed fracture with routine healing: Secondary | ICD-10-CM

## 2017-02-19 LAB — CBC
HCT: 20.2 % — ABNORMAL LOW (ref 36.0–46.0)
HEMOGLOBIN: 6 g/dL — AB (ref 12.0–15.0)
MCH: 27.1 pg (ref 26.0–34.0)
MCHC: 29.7 g/dL — ABNORMAL LOW (ref 30.0–36.0)
MCV: 91.4 fL (ref 78.0–100.0)
PLATELETS: 232 10*3/uL (ref 150–400)
RBC: 2.21 MIL/uL — AB (ref 3.87–5.11)
RDW: 16.3 % — ABNORMAL HIGH (ref 11.5–15.5)
WBC: 10.4 10*3/uL (ref 4.0–10.5)

## 2017-02-19 LAB — GLUCOSE, CAPILLARY
GLUCOSE-CAPILLARY: 270 mg/dL — AB (ref 65–99)
Glucose-Capillary: 238 mg/dL — ABNORMAL HIGH (ref 65–99)
Glucose-Capillary: 238 mg/dL — ABNORMAL HIGH (ref 65–99)
Glucose-Capillary: 231 mg/dL — ABNORMAL HIGH (ref 65–99)
Glucose-Capillary: 202 mg/dL — ABNORMAL HIGH (ref 65–99)
Glucose-Capillary: 173 mg/dL — ABNORMAL HIGH (ref 65–99)

## 2017-02-19 LAB — FERRITIN: FERRITIN: 25 ng/mL (ref 11–307)

## 2017-02-19 LAB — IRON AND TIBC
Iron: 354 ug/dL — ABNORMAL HIGH (ref 28–170)
SATURATION RATIOS: 84 % — AB (ref 10.4–31.8)
TIBC: 420 ug/dL (ref 250–450)
UIBC: 66 ug/dL

## 2017-02-19 LAB — RETICULOCYTES
RBC.: 3.9 MIL/uL (ref 3.87–5.11)
RETIC CT PCT: 3.4 % — AB (ref 0.4–3.1)
Retic Count, Absolute: 132.6 10*3/uL (ref 19.0–186.0)

## 2017-02-19 LAB — BASIC METABOLIC PANEL
ANION GAP: 8 (ref 5–15)
BUN: 31 mg/dL — ABNORMAL HIGH (ref 6–20)
CALCIUM: 7.8 mg/dL — AB (ref 8.9–10.3)
CHLORIDE: 109 mmol/L (ref 101–111)
CO2: 24 mmol/L (ref 22–32)
CREATININE: 1.45 mg/dL — AB (ref 0.44–1.00)
GFR calc non Af Amer: 34 mL/min — ABNORMAL LOW (ref >60)
GFR, EST AFRICAN AMERICAN: 39 mL/min — AB (ref >60)
Glucose, Bld: 242 mg/dL — ABNORMAL HIGH (ref 65–99)
Potassium: 4.2 mmol/L (ref 3.5–5.1)
SODIUM: 141 mmol/L (ref 135–145)

## 2017-02-19 LAB — VITAMIN B12: Vitamin B-12: 1308 pg/mL — ABNORMAL HIGH (ref 180–914)

## 2017-02-19 LAB — PREPARE RBC (CROSSMATCH)

## 2017-02-19 LAB — FOLATE: Folate: 23.2 ng/mL (ref >5.9)

## 2017-02-19 MED ORDER — INSULIN ASPART 100 UNIT/ML ~~LOC~~ SOLN
0.0000 [IU] | Freq: Three times a day (TID) | SUBCUTANEOUS | Status: DC
  Administered 2017-02-19 (×3): 3 [IU] via SUBCUTANEOUS
  Administered 2017-02-20: 2 [IU] via SUBCUTANEOUS
  Administered 2017-02-20: 3 [IU] via SUBCUTANEOUS
  Administered 2017-02-20: 1 [IU] via SUBCUTANEOUS
  Administered 2017-02-21 (×2): 2 [IU] via SUBCUTANEOUS
  Administered 2017-02-21: 3 [IU] via SUBCUTANEOUS
  Administered 2017-02-22: 5 [IU] via SUBCUTANEOUS
  Administered 2017-02-22 (×2): 2 [IU] via SUBCUTANEOUS
  Administered 2017-02-23: 3 [IU] via SUBCUTANEOUS

## 2017-02-19 MED ORDER — SODIUM CHLORIDE 0.9 % IV SOLN
Freq: Once | INTRAVENOUS | Status: AC
  Administered 2017-02-19: 09:00:00 via INTRAVENOUS

## 2017-02-19 MED ORDER — ENOXAPARIN SODIUM 40 MG/0.4ML ~~LOC~~ SOLN
40.0000 mg | SUBCUTANEOUS | Status: DC
  Administered 2017-02-19 – 2017-02-20 (×2): 40 mg via SUBCUTANEOUS
  Filled 2017-02-19 (×2): qty 0.4

## 2017-02-19 MED ORDER — INSULIN GLARGINE 100 UNIT/ML ~~LOC~~ SOLN
30.0000 [IU] | Freq: Every day | SUBCUTANEOUS | Status: DC
  Administered 2017-02-19 – 2017-02-20 (×2): 30 [IU] via SUBCUTANEOUS
  Filled 2017-02-19 (×2): qty 0.3

## 2017-02-19 MED ORDER — DIVALPROEX SODIUM 125 MG PO CSDR
125.0000 mg | DELAYED_RELEASE_CAPSULE | Freq: Two times a day (BID) | ORAL | Status: DC
  Administered 2017-02-19 – 2017-02-23 (×8): 125 mg via ORAL
  Filled 2017-02-19 (×9): qty 1

## 2017-02-19 MED ORDER — GLUCERNA SHAKE PO LIQD
237.0000 mL | Freq: Two times a day (BID) | ORAL | Status: DC
  Administered 2017-02-21 – 2017-02-22 (×2): 237 mL via ORAL

## 2017-02-19 NOTE — Progress Notes (Signed)
   Assessment / Plan: 1 Day Post-Op  S/P Procedure(s) (LRB): INTRAMEDULLARY (IM) NAIL FEMORAL (Left) by Dr. Jewel Baizeimothy D. Eulah PontMurphy on 02/18/17  Principal Problem:   Closed intertrochanteric fracture of left femur (HCC) Active Problems:   Dementia   Hypothyroidism   Diabetes mellitus, type 2 (HCC)   Fall   Hip fracture (HCC)   Closed fracture of right proximal radius and ulna   Closed fracture of proximal end of right humerus  Acute Blood Loss Anemia, Hgb 6.0 < 8.2.  Agree w/ plan for transfusion.  Closed left intertrochanteric femur fracture   Closed right proximal humerus, radius, and ulna fractures: Continue nonoperative management  Sling for comfort. Nonweightbearing right upper extremity   Mobilize with therapy when able Incentive Spirometry Apply ice PRN  Weight Bearing: WBAT LLE.  NWB RUE Dressings: PRN.  VTE prophylaxis: Lovenox, SCDs Dispo: Skilled Nursing Facility/Rehab when stable medically   Objective:   VITALS:   Vitals:   02/18/17 1611 02/18/17 2032 02/19/17 0045 02/19/17 0447  BP: (!) 162/88 (!) 160/82 133/79 (!) 154/69  Pulse: (!) 123 (!) 124 (!) 123 (!) 117  Resp: 12  17 16   Temp:  97.9 F (36.6 C) 98.8 F (37.1 C) 98.6 F (37 C)  TempSrc:  Oral Axillary Axillary  SpO2: 98% 96% 95% 96%   CBC Latest Ref Rng & Units 02/19/2017 02/18/2017 02/17/2017  WBC 4.0 - 10.5 K/uL 10.4 13.3(H) 13.5(H)  Hemoglobin 12.0 - 15.0 g/dL 1.6(XW6.0(LL) 9.6(E8.2(L) 4.5(W9.8(L)  Hematocrit 36.0 - 46.0 % 20.2(L) 26.5(L) 31.4(L)  Platelets 150 - 400 K/uL 232 276 241   BMP Latest Ref Rng & Units 02/19/2017 02/18/2017 02/17/2017  Glucose 65 - 99 mg/dL 098(J242(H) 191(Y481(H) 782(NF508(HH)  BUN 6 - 20 mg/dL 62(Z31(H) 30(Q28(H) 65(H27(H)  Creatinine 0.44 - 1.00 mg/dL 8.46(N1.45(H) 6.29(B1.51(H) 2.84(X1.46(H)  Sodium 135 - 145 mmol/L 141 138 135  Potassium 3.5 - 5.1 mmol/L 4.2 4.6 4.6  Chloride 101 - 111 mmol/L 109 105 100(L)  CO2 22 - 32 mmol/L 24 21(L) 22  Calcium 8.9 - 10.3 mg/dL 7.8(L) 8.2(L) 8.5(L)   Intake/Output      09/10 0701 -  09/11 0700 09/11 0701 - 09/12 0700   P.O. 0    I.V. 2000    Total Intake 2000     Urine 400    Blood 150    Total Output 550     Net +1450            Physical Exam: General: NAD.  Sleeping soundly.  [place] in place 2L, O2 97%.   MSK Exam limited as patient is not cooperative during exam, has dementia, and is extremely hard of hearing. Dressings C/D/I LLE: Feet warm.  Responds to stimuli distally. RUE: In sling.  Hand warm.  Swelling somewhat improved.  Sheena KernHenry Calvin Martensen III, PA-C 02/19/2017, 7:50 AM

## 2017-02-19 NOTE — Anesthesia Postprocedure Evaluation (Signed)
Anesthesia Post Note  Patient: Sheena [name]  Procedure(s) Performed: Procedure(s) (LRB): INTRAMEDULLARY (IM) NAIL FEMORAL (Left)     Patient location during evaluation: PACU Anesthesia Type: General Level of consciousness: lethargic Pain management: pain level controlled Vital Signs Assessment: post-procedure vital signs reviewed and stable Respiratory status: spontaneous breathing Cardiovascular status: stable Postop Assessment: no signs of nausea or vomiting Anesthetic complications: no    Last Vitals:  Vitals:   02/19/17 1237 02/19/17 1529  BP: (!) 173/73 (!) 170/62  Pulse: (!) 109 (!) 103  Resp: 16 18  Temp: 37 C 37.1 C  SpO2: 93% 92%    Last Pain:  Vitals:   02/19/17 1529  TempSrc: Oral  PainSc:    Pain Goal:                 Aeisha Minarik JR,JOHN Murrell Elizondo

## 2017-02-19 NOTE — Progress Notes (Signed)
PROGRESS NOTE    Sheena [name]   UJW:[id number]  DOB: May 10, 1941  DOA: 02/17/2017 PCP: Georgann Housekeeper, MD   Brief Narrative:  Sheena [name] is a 76 y/o from [facility] with dementia, frequent falls, hypothyroidism, HTN, very hard of hearing who presents to the ER after a fall while getting out of her wheelchair.  Found to have  - comminuted and impacted fracture of the surgical neck of the right humerus - acute fractures right radial neck and proximal ulnar - acute comminuted fractures of the intertrochanteric and subtrochanteric left femur.  Admitted 4/17-4/20 for right wrist fx 5/24- brought to ER again after fall and a laceration to the scalp  Subjective: Awake. Non-verbal.     Assessment & Plan:   Principal Problem:   Closed intertrochanteric fracture of left femur  - intermedullary nail- 9/10 - moderate risk for surgery - pain control- received 50 mcg of Fentanyl on night of admission and became very lethargic- son states this has happened before with a Fentanyl patch- was hard to arouse when I evaluated her yesterday-  Use low dose PRN Hydrocodone for now  Active Problems:   Closed fracture of right proximal radius and ulna   Closed fracture of proximal end of right humerus - nonoperative management at this time. - Sling for comfort. - Nonweightbearing right upper extremity  Anemia due to acute blood loss - Hb 6 this AM, quite pale on exam - transfusion 2 U PRBC- watch overnight for further bleeding     Diabetes mellitus, type 2  - a1c 6.8 in 4/18- sugars now 300-400s despite not eating - on Lantus 30 U daily now nd ISS - previously was not on Insulin- A1c is 8.8 and suggests that her sugars have not been this high in past few months-  - cont SSI today - will give another dose of Lantus 10 U now- received on in the middle of the night  AKI/ dehydration - Cr up today likely due to blood loss- follow  Very hard of hearing    Dementia - Aricept and  Depakote    Hypothyroidism - Synthroid   DVT prophylaxis: per ortho Code Status: previously DNR  Family Communication: with son Disposition Plan: SNF tomorrow Consultants:   ortho Procedures:    Antimicrobials:  Anti-infectives    Start     Dose/Rate Route Frequency Ordered Stop   02/18/17 1515  ceFAZolin (ANCEF) IVPB 2g/100 mL premix     2 g 200 mL/hr over 30 Minutes Intravenous On call to O.R. 02/18/17 0945 02/18/17 1454       Objective: Vitals:   02/19/17 0854 02/19/17 1127 02/19/17 1222 02/19/17 1237  BP: (!) 159/74 (!) 161/84 (!) 160/80 (!) 173/73  Pulse: (!) 113 (!) 109 (!) 106 (!) 109  Resp: Temp: 98.3 F (36.8 C) 98.8 F (37.1 C) 98.7 F (37.1 C) 98.6 F (37 C)  TempSrc: Oral Oral Oral Oral  SpO2: 100% 96% 98% 93%    Intake/Output Summary (Last 24 hours) at 02/19/17 1434 Last data filed at 02/19/17 1127  Gross per 24 hour  Intake             2486 ml  Output              150 ml  Net             2336 ml   There were no vitals filed for this visit.  Examination: General exam:  Appears comfortable - very pale- non-verbal HEENT: PERRLA, oral mucosa moist, no sclera icterus or thrush Respiratory system: Clear to auscultation. Respiratory effort normal. Cardiovascular system: S1 & S2 heard,  No murmurs  Gastrointestinal system: Abdomen soft, non-tender, nondistended. Normal bowel sound. No organomegaly Central nervous system: Alert and oriented. No focal neurological deficits. Extremities: No cyanosis, clubbing or edema- right arm in sling Skin: No rashes or ulcers Psychiatry:  Mood & affect appropriate.    Data Reviewed: I have personally reviewed following labs and imaging studies  CBC:  Recent Labs Lab 02/17/17 2207 02/18/17 0302 02/19/17 0534  WBC 13.5* 13.3* 10.4  NEUTROABS 11.1*  --   --   HGB 9.8* 8.2* 6.0*  HCT 31.4* 26.5* 20.2*  MCV 89.0 89.2 91.4  PLT 241 276 232   Basic Metabolic Panel:  Recent Labs Lab  02/17/17 2207 02/18/17 0302 02/19/17 0534  NA 135 138 141  K 4.6 4.6 4.2  CL 100* 105 109  CO2 22 21* 24  GLUCOSE 508* 481* 242*  BUN 27* 28* 31*  CREATININE 1.46* 1.51* 1.45*  CALCIUM 8.5* 8.2* 7.8*   GFR: CrCl cannot be calculated (Unknown ideal weight.). Liver Function Tests:  Recent Labs Lab 02/17/17 2207  AST 22  ALT 19  ALKPHOS 127*  BILITOT 1.1  PROT 6.0*  ALBUMIN 3.3*   No results for input(s): LIPASE, AMYLASE in the last 168 hours. No results for input(s): AMMONIA in the last 168 hours. Coagulation Profile: No results for input(s): INR, PROTIME in the last 168 hours. Cardiac Enzymes: No results for input(s): CKTOTAL, CKMB, CKMBINDEX, TROPONINI in the last 168 hours. BNP (last 3 results) No results for input(s): PROBNP in the last 8760 hours. HbA1C:  Recent Labs  02/18/17 0302  HGBA1C 8.8*   CBG:  Recent Labs Lab 02/18/17 2131 02/19/17 0032 02/19/17 0443 02/19/17 0757 02/19/17 1221  GLUCAP 250* 270* 238* 238* 231*   Lipid Profile: No results for input(s): CHOL, HDL, LDLCALC, TRIG, CHOLHDL, LDLDIRECT in the last 72 hours. Thyroid Function Tests: No results for input(s): TSH, T4TOTAL, FREET4, T3FREE, THYROIDAB in the last 72 hours. Anemia Panel: No results for input(s): VITAMINB12, FOLATE, FERRITIN, TIBC, IRON, RETICCTPCT in the last 72 hours. Urine analysis:    Component Value Date/Time   COLORURINE YELLOW 02/18/2017 0530   APPEARANCEUR HAZY (A) 02/18/2017 0530   LABSPEC 1.027 02/18/2017 0530   PHURINE 6.0 02/18/2017 0530   GLUCOSEU >=500 (A) 02/18/2017 0530   HGBUR NEGATIVE 02/18/2017 0530   BILIRUBINUR NEGATIVE 02/18/2017 0530   KETONESUR 20 (A) 02/18/2017 0530   PROTEINUR NEGATIVE 02/18/2017 0530   UROBILINOGEN 1.0 12/16/2012 1937   NITRITE POSITIVE (A) 02/18/2017 0530   LEUKOCYTESUR NEGATIVE 02/18/2017 0530   Sepsis Labs: (procalcitonin:4,lacticidven:4) ) Recent Results (from the past 240 hour(s))  Surgical pcr screen      Status: None   Collection Time: 02/18/17  3:34 AM  Result Value Ref Range Status   MRSA, PCR NEGATIVE NEGATIVE Final   Staphylococcus aureus NEGATIVE NEGATIVE Final    Comment: (NOTE) The Xpert SA Assay (FDA approved for NASAL specimens in patients 70 years of age and older), is one component of a comprehensive surveillance program. It is not intended to diagnose infection nor to guide or monitor treatment.          Radiology Studies: Dg Chest 1 View  Result Date: 02/17/2017 CLINICAL DATA:  Pain after a fall. EXAM: CHEST 1 VIEW COMPARISON:  12/16/2012 FINDINGS: Shallow inspiration. Heart size and pulmonary  vascularity are normal. Lungs are clear and expanded. No blunting of costophrenic angles. No pneumothorax. Calcified and tortuous aorta. Surgical clips in the base of the neck. Old right rib fractures. IMPRESSION: No evidence of active pulmonary disease. Electronically Signed   By: Burman Nieves M.D.   On: 02/17/2017 22:06   Dg Pelvis 1-2 Views  Result Date: 02/17/2017 CLINICAL DATA:  Larey Seat out of the chair. Deformity of left femur and right arm pain. EXAM: PELVIS - 1-2 VIEW COMPARISON:  09/25/2016 FINDINGS: Acute inter trochanteric fracture of the left proximal femur extending to the sub trochanteric region laterally. Displaced lesser trochanteric fragment with displaced lateral infra trochanteric fragment. No dislocation of the hip joint. Old fracture deformities of the superior and inferior pubic rami bilaterally are unchanged since previous study. Calcification along the right greater trochanter is also unchanged. Diffuse bone demineralization. Degenerative changes in the lower lumbar spine and in both hips. SI joints and symphysis pubis are not displaced. Visualized sacrum appears intact. IMPRESSION: Acute comminuted inter trochanteric and sub trochanteric fracture of the left proximal femur. Diffuse bone demineralization and old fracture deformities as described. Electronically  Signed   By: Burman Nieves M.D.   On: 02/17/2017 21:48   Dg Forearm Right  Result Date: 02/17/2017 CLINICAL DATA:  Right arm pain after a fall. EXAM: RIGHT FOREARM - 2 VIEW COMPARISON:  Right wrist 09/25/2016 FINDINGS: Acute fracture of the right radial neck with mild angulation. No definite intra-articular extension on the views obtained. Linear nondisplaced fracture through the olecranon portion of the proximal ulna with extension to the articular surface. No dislocation of the elbow joint is suggested although dedicated elbow views would be useful in complete evaluation. There is deformity and sclerosis of the distal right radius which appears similar to previous study suggesting an old fracture deformity. Old ununited ossicle at the ulnar styloid process. Degenerative changes in the carpus. Diffuse bone demineralization. IMPRESSION: Acute fractures of the right radial neck and proximal ulna. Ulnar fractures extend to the articular surface. Old fracture deformities of the distal right radius and ulna. Electronically Signed   By: Burman Nieves M.D.   On: 02/17/2017 21:52   Ct Head Wo Contrast  Result Date: 02/17/2017 CLINICAL DATA:  Fall earlier. Head trauma. No loss of consciousness. EXAM: CT HEAD WITHOUT CONTRAST TECHNIQUE: Contiguous axial images were obtained from the base of the skull through the vertex without intravenous contrast. COMPARISON:  11/01/2016 FINDINGS: Brain: Diffuse cerebral atrophy. Mild ventricular dilatation consistent with central atrophy. Low-attenuation changes throughout the deep white matter consistent with small vessel ischemia. No mass effect or midline shift. No abnormal extra-axial fluid collections. Gray-white matter junctions are distinct. Basal cisterns are not effaced. No acute intracranial hemorrhage. Vascular: Vascular calcifications are demonstrated in the internal carotid arteries. Skull: Calvarium appears intact. Sinuses/Orbits: Right cochlear implant with  streak artifact limiting visualization of some areas. Postoperative changes in the right temporal bone. Visualized paranasal sinuses are clear. Other: No significant changes since previous study. IMPRESSION: No acute intracranial abnormalities. Chronic atrophy and small vessel ischemic changes. Electronically Signed   By: Burman Nieves M.D.   On: 02/17/2017 22:10   Dg Humerus Right  Result Date: 02/17/2017 CLINICAL DATA:  Right arm pain and deformity after a fall. EXAM: RIGHT HUMERUS - 2+ VIEW COMPARISON:  Right shoulder 09/25/2016 FINDINGS: Diffuse bone demineralization. Comminuted and impacted fractures of the surgical neck of the right humerus. Fracture lines extend along the lateral aspect of the humeral head with displaced greater tuberosity  fragment. Mild lateral angulation of the distal fracture fragment. Degenerative changes in the glenohumeral joint. Subacromial spurring. 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 Midshaft and distal humerus appear intact. IMPRESSION: Comminuted and impacted fractures of the surgical neck of the right humerus with fracture lines extending along the lateral aspect of the humeral head with displaced greater tuberosity fragment. Chronic degenerative changes in the glenohumeral and acromioclavicular joints. Subacromial calcifications suggesting calcific tendinosis. Electronically Signed   By: Burman NievesWilliam  Stevens M.D.   On: 02/17/2017 21:49   Dg C-arm 1-60 Min  Result Date: 02/18/2017 CLINICAL DATA:  Intramedullary fixation of left femur. EXAM: LEFT FEMUR 2 VIEWS; DG C-ARM 61-120 MIN Fluoroscopy time: 58 seconds. COMPARISON:  Radiographs of February 17, 2017. FINDINGS: Four intraoperative fluoroscopic images of the left femur demonstrate the patient be status post surgical internal fixation intertrochanteric fracture of proximal left femur with intramedullary rod fixation of the femoral  shaft. IMPRESSION: Status post surgical internal fixation of proximal left intertrochanteric femoral fracture. Electronically Signed   By: Lupita RaiderJames  Green Jr, M.D.   On: 02/18/2017 15:57   Dg Femur Min 2 Views Left  Result Date: 02/18/2017 CLINICAL DATA:  Intramedullary fixation of left femur. EXAM: LEFT FEMUR 2 VIEWS; DG C-ARM 61-120 MIN Fluoroscopy time: 58 seconds. COMPARISON:  Radiographs of February 17, 2017. FINDINGS: Four intraoperative fluoroscopic images of the left femur demonstrate the patient be status post surgical internal fixation intertrochanteric fracture of proximal left femur with intramedullary rod fixation of the femoral shaft. IMPRESSION: Status post surgical internal fixation of proximal left intertrochanteric femoral fracture. Electronically Signed   By: Lupita RaiderJames  Green Jr, M.D.   On: 02/18/2017 15:57   Dg Femur Min 2 Views Left  Result Date: 02/17/2017 CLINICAL DATA:  Pain and deformity of the left femur after a fall. EXAM: LEFT FEMUR 2 VIEWS COMPARISON:  None. FINDINGS: Acute comminuted fractures of the inter trochanteric and sub trochanteric left hip. Displaced lesser trochanteric fragment. Displaced fragment off of the inferior aspect of the greater trochanter extending to the sub trochanteric region. Mild varus angulation. No dislocation of the left hip. Degenerative changes in the left hip. Old fracture deformities of the pubic rami. Midshaft and distal femur appear intact. Diffuse bone demineralization. IMPRESSION: Acute comminuted fractures of the inter trochanteric and sub trochanteric left hip with displaced lesser trochanteric fragment. Electronically Signed   By: Burman NievesWilliam  Stevens M.D.   On: 02/17/2017 22:05      Scheduled Meds: . divalproex  125 mg Oral Q12H  . donepezil  10 mg Oral QHS  . enoxaparin (LOVENOX) injection  40 mg Subcutaneous Q24H  . insulin aspart  0-9 Units Subcutaneous TID WC  . insulin aspart  5 Units Intravenous Once  . insulin glargine  30 Units  Subcutaneous Daily  . levothyroxine  100 mcg Oral QAC breakfast  . magnesium oxide  200 mg Oral Daily  . mirtazapine  7.5 mg Oral QHS  . multivitamin with minerals  1 tablet Oral QPM  . potassium chloride  10 mEq Oral BID  . QUEtiapine  50 mg Oral QHS  . vitamin B-12  1,000 mcg Oral QPM  . vitamin C  500 mg Oral QPM   Continuous Infusions: . sodium chloride 100 mL/hr at 02/19/17 0200  . lactated ringers 10 mL/hr at 02/18/17 1338     LOS: 1 day    Time spent in minutes: 35    Calvert CantorSaima Rober Skeels, MD [facility]  Pager: [email or web address] Password [facility] 02/19/2017, 2:34 PM

## 2017-02-19 NOTE — Progress Notes (Signed)
Initial Nutrition Assessment  DOCUMENTATION CODES:   Not applicable  INTERVENTION:  Provide Glucerna Shake po BID, each supplement provides 220 kcal and 10 grams of protein.  Recommend obtaining new weight to fully assess weight trends.  NUTRITION DIAGNOSIS:   Increased nutrient needs related to  (post op healing) as evidenced by estimated needs.  GOAL:   Patient will meet greater than or equal to 90% of their needs  MONITOR:   PO intake, Supplement acceptance, Labs, Weight trends, Skin, I & O's  REASON FOR ASSESSMENT:   Low Braden    ASSESSMENT:   76 y.o. female with medical history significant for- dementia, prior falls, hypothyroidism, hypertension presents after fall. X-rays show multiple fractures - comminuted and impacted fracture of the surgical neck of the right humerus, acute fractures right radial neck and proximal ulnar, acute comminuted fractures of the intertrochanteric and subtrochanteric left femur.   PROCEDURE (9/10):  INTRAMEDULLARY (IM) NAIL FEMORAL  Pt was asleep during time of visit and did not awaken. No family at bedside. Meal completion this AM was 10%. Noted no recent weight recorded. Recommend obtaining new weight to fully assess weight trends. RD to order nutritional supplements to aid in adequate caloric and protein needs.  Limited Nutrition-Focused physical exam completed. Findings are no fat depletion, no muscle depletion, and mild edema.   Labs and medications reviewed.   Diet Order:  Diet Carb Modified Fluid consistency: Thin; Room service appropriate? Yes  Skin:   (Incision L hip)  Last BM:  Unknown  Height:   Ht Readings from Last 1 Encounters:  11/01/16 5\' 5"  (1.651 m)    Weight:   Wt Readings from Last 1 Encounters:  11/01/16 126 lb (57.2 kg)    Ideal Body Weight:  56.8 kg  BMI:  There is no height or weight on file to calculate BMI.  Estimated Nutritional Needs:   Kcal:  1700-1900  Protein:  70-80 grams  Fluid:   1.7 - 1.9 L/day  EDUCATION NEEDS:   No education needs identified at this time  Roslyn SmilingStephanie Maudy Yonan, MS, RD, LDN Pager # [phone] After hours/ weekend pager # [phone]

## 2017-02-20 ENCOUNTER — Inpatient Hospital Stay (HOSPITAL_COMMUNITY): Payer: Medicare Other

## 2017-02-20 DIAGNOSIS — E1169 Type 2 diabetes mellitus with other specified complication: Secondary | ICD-10-CM

## 2017-02-20 DIAGNOSIS — E039 Hypothyroidism, unspecified: Secondary | ICD-10-CM

## 2017-02-20 DIAGNOSIS — Z794 Long term (current) use of insulin: Secondary | ICD-10-CM

## 2017-02-20 DIAGNOSIS — G9341 Metabolic encephalopathy: Secondary | ICD-10-CM

## 2017-02-20 DIAGNOSIS — S72142D Displaced intertrochanteric fracture of left femur, subsequent encounter for closed fracture with routine healing: Secondary | ICD-10-CM

## 2017-02-20 LAB — BPAM RBC
Blood Product Expiration Date: 201809192359
Blood Product Expiration Date: 201809202359
ISSUE DATE / TIME: 201809110815
ISSUE DATE / TIME: 201809111211
Unit Type and Rh: 7300
Unit Type and Rh: 7300

## 2017-02-20 LAB — GLUCOSE, CAPILLARY
GLUCOSE-CAPILLARY: 139 mg/dL — AB (ref 65–99)
GLUCOSE-CAPILLARY: 125 mg/dL — AB (ref 65–99)
Glucose-Capillary: 164 mg/dL — ABNORMAL HIGH (ref 65–99)
Glucose-Capillary: 217 mg/dL — ABNORMAL HIGH (ref 65–99)
Glucose-Capillary: 225 mg/dL — ABNORMAL HIGH (ref 65–99)
Glucose-Capillary: 226 mg/dL — ABNORMAL HIGH (ref 65–99)

## 2017-02-20 LAB — TYPE AND SCREEN
ABO/RH(D): B POS
Antibody Screen: NEGATIVE
UNIT DIVISION: 0
Unit division: 0

## 2017-02-20 LAB — CBC
HCT: 29.3 % — ABNORMAL LOW (ref 36.0–46.0)
Hemoglobin: 9.6 g/dL — ABNORMAL LOW (ref 12.0–15.0)
MCH: 28.2 pg (ref 26.0–34.0)
MCHC: 32.8 g/dL (ref 30.0–36.0)
MCV: 85.9 fL (ref 78.0–100.0)
Platelets: 117 K/uL — ABNORMAL LOW (ref 150–400)
RBC: 3.41 MIL/uL — ABNORMAL LOW (ref 3.87–5.11)
RDW: 15.8 % — ABNORMAL HIGH (ref 11.5–15.5)
WBC: 8.7 K/uL (ref 4.0–10.5)

## 2017-02-20 LAB — BASIC METABOLIC PANEL WITH GFR
Anion gap: 7 (ref 5–15)
BUN: 21 mg/dL — ABNORMAL HIGH (ref 6–20)
CO2: 25 mmol/L (ref 22–32)
Calcium: 7.7 mg/dL — ABNORMAL LOW (ref 8.9–10.3)
Chloride: 108 mmol/L (ref 101–111)
Creatinine, Ser: 1.15 mg/dL — ABNORMAL HIGH (ref 0.44–1.00)
GFR calc Af Amer: 52 mL/min — ABNORMAL LOW
GFR calc non Af Amer: 45 mL/min — ABNORMAL LOW
Glucose, Bld: 220 mg/dL — ABNORMAL HIGH (ref 65–99)
Potassium: 3.9 mmol/L (ref 3.5–5.1)
Sodium: 140 mmol/L (ref 135–145)

## 2017-02-20 LAB — TSH: TSH: 50.012 u[IU]/mL — AB (ref 0.350–4.500)

## 2017-02-20 MED ORDER — LEVOTHYROXINE SODIUM 100 MCG IV SOLR: 50.0000 ug | Freq: Every day | INTRAVENOUS | Status: DC

## 2017-02-20 MED ORDER — HYDROCODONE-ACETAMINOPHEN 5-325 MG PO TABS: 1.0000 | ORAL_TABLET | Freq: Four times a day (QID) | ORAL | 0 refills | Status: DC | PRN

## 2017-02-20 MED ORDER — INSULIN GLARGINE 100 UNIT/ML ~~LOC~~ SOLN
15.0000 [IU] | Freq: Every day | SUBCUTANEOUS | Status: DC
  Administered 2017-02-21 – 2017-02-23 (×3): 15 [IU] via SUBCUTANEOUS
  Filled 2017-02-20 (×3): qty 0.15

## 2017-02-20 MED ORDER — ENOXAPARIN SODIUM 40 MG/0.4ML ~~LOC~~ SOLN: 40.0000 mg | SUBCUTANEOUS | 0 refills | Status: DC

## 2017-02-20 MED ORDER — NALOXONE HCL 0.4 MG/ML IJ SOLN
INTRAMUSCULAR | Status: AC
  Filled 2017-02-20: qty 1

## 2017-02-20 MED ORDER — SODIUM CHLORIDE 0.9 % IV SOLN
INTRAVENOUS | Status: DC
  Administered 2017-02-20 – 2017-02-22 (×2): via INTRAVENOUS

## 2017-02-20 MED ORDER — LEVOTHYROXINE SODIUM 100 MCG IV SOLR
75.0000 ug | Freq: Every day | INTRAVENOUS | Status: DC
  Administered 2017-02-20 – 2017-02-23 (×4): 75 ug via INTRAVENOUS
  Filled 2017-02-20 (×5): qty 5

## 2017-02-20 MED ORDER — NALOXONE HCL 0.4 MG/ML IJ SOLN
0.2000 mg | Freq: Once | INTRAMUSCULAR | Status: AC
  Administered 2017-02-20: 0.2 mg via INTRAVENOUS

## 2017-02-20 NOTE — Progress Notes (Addendum)
PROGRESS NOTE    Sheena [name]  EXB:[id number] DOB: [birth year] DOA: 02/17/2017 PCP: Georgann Housekeeper, MD   Brief Narrative: 76 y/o from [facility] with dementia, frequent falls, hypothyroidism, HTN, very hard of hearing who presents to the ER after a fall while getting out of her wheelchair.  Found to have  - comminuted and impacted fracture of the surgical neck of the right humerus - acute fractures right radial neck and proximal ulnar - acute comminuted fractures of the intertrochanteric and subtrochanteric left femur.  Assessment & Plan:   # Closed intertrochanteric fracture of left femur: Status post intermedullary nailing by orthopedics on September 10. Patient was lethargic this morning and therefore discontinued pain medication. -PT OT evaluation and supportive care. -Follow-up orthopedic's plan.  #  Closed fracture of right proximal radius and ulna     Closed fracture of proximal end of right humerus - nonoperative management at this time. - Sling for comfort. - Nonweightbearing right upper extremity  #Acute encephalopathy likely a metabolic versus toxic: Patient was lethargic this morning. CT scan of head obtained with no acute finding. Monitor mental status. Ordered a dose of Narcan. Patient has very hard of hearing. Minimize sedatives. -start IVF -neuro check  #Acute blood loss anemia: Received 2 units of red blood cell. Hemoglobin stable. No sign of active bleeding.  #Type 2 diabetes: Monitor blood sugar level. I lowered the dose of Lantus to 15 units because patient is not eating.  #Acute kidney injury: Serum creatinine level improved.  #History of dementia without behavioral issue: Continue Aricept and Depakote when able to take orally.  #Hypothyroidism: Continue Synthroid.check TSH  Addendum: TSH elevated to 50. Change to IV synthroid, higher dose.   DVT prophylaxis: Lovenox subcutaneous Code Status: DO NOT RESUSCITATE Family Communication: No family at  bedside Disposition Plan: Currently admitted    Consultants:   Orthopedics  Procedures: Orthopedic SURGERY Antimicrobials: None  Subjective: Seen and examined at bedside. Patient was lethargic and responsive to painful stimuli only. Review of systems Limited.  Objective: Vitals:   02/19/17 1529 02/19/17 2139 02/20/17 0542 02/20/17 0802  BP: (!) 170/62 (!) 143/48 (!) 166/76 (!) 154/70  Pulse: (!) 103 (!) 102 94 97  Resp: Temp: 98.8 F (37.1 C) 99.5 F (37.5 C) 99 F (37.2 C) 99 F (37.2 C)  TempSrc: Oral Axillary Axillary Axillary  SpO2: 92% 93% 92% 96%    Intake/Output Summary (Last 24 hours) at 02/20/17 1239 Last data filed at 02/20/17 0800  Gross per 24 hour  Intake              394 ml  Output              400 ml  Net               -6 ml   There were no vitals filed for this visit.  Examination:  General exam: Lethargic female not in distress  Respiratory system: Clear to auscultation. Respiratory effort normal. No wheezing or crackle Cardiovascular system: S1 & S2 heard, RRR.  No pedal edema. Gastrointestinal system: Abdomen is nondistended, soft and nontender. Normal bowel sounds heard. Central nervous system: Not following commands Skin: No rashes, lesions or ulcers Psychiatry: Unable to assess    Data Reviewed: I have personally reviewed following labs and imaging studies  CBC:  Recent Labs Lab 02/17/17 2207 02/18/17 0302 02/19/17 0534 02/20/17 0409  WBC 13.5* 13.3* 10.4 8.7  NEUTROABS 11.1*  --   --   --  HGB 9.8* 8.2* 6.0* 9.6*  HCT 31.4* 26.5* 20.2* 29.3*  MCV 89.0 89.2 91.4 85.9  PLT 241 276 232 117*   Basic Metabolic Panel:  Recent Labs Lab 02/17/17 2207 02/18/17 0302 02/19/17 0534 02/20/17 0409  NA 135 138 141 140  K 4.6 4.6 4.2 3.9  CL 100* 105 109 108  CO2 22 21* 24 25  GLUCOSE 508* 481* 242* 220*  BUN 27* 28* 31* 21*  CREATININE 1.46* 1.51* 1.45* 1.15*  CALCIUM 8.5* 8.2* 7.8* 7.7*   GFR: CrCl cannot be  calculated (Unknown ideal weight.). Liver Function Tests:  Recent Labs Lab 02/17/17 2207  AST 22  ALT 19  ALKPHOS 127*  BILITOT 1.1  PROT 6.0*  ALBUMIN 3.3*   No results for input(s): LIPASE, AMYLASE in the last 168 hours. No results for input(s): AMMONIA in the last 168 hours. Coagulation Profile: No results for input(s): INR, PROTIME in the last 168 hours. Cardiac Enzymes: No results for input(s): CKTOTAL, CKMB, CKMBINDEX, TROPONINI in the last 168 hours. BNP (last 3 results) No results for input(s): PROBNP in the last 8760 hours. HbA1C:  Recent Labs  02/18/17 0302  HGBA1C 8.8*   CBG:  Recent Labs Lab 02/19/17 1957 02/20/17 0044 02/20/17 0504 02/20/17 0750 02/20/17 1214  GLUCAP 173* 225* 217* 226* 164*   Lipid Profile: No results for input(s): CHOL, HDL, LDLCALC, TRIG, CHOLHDL, LDLDIRECT in the last 72 hours. Thyroid Function Tests: No results for input(s): TSH, T4TOTAL, FREET4, T3FREE, THYROIDAB in the last 72 hours. Anemia Panel:  Recent Labs  02/19/17 2006  VITAMINB12 1,308*  FOLATE 23.2  FERRITIN 25  TIBC 420  IRON 354*  RETICCTPCT 3.4*   Sepsis Labs: No results for input(s): PROCALCITON, LATICACIDVEN in the last 168 hours.  Recent Results (from the past 240 hour(s))  Surgical pcr screen     Status: None   Collection Time: 02/18/17  3:34 AM  Result Value Ref Range Status   MRSA, PCR NEGATIVE NEGATIVE Final   Staphylococcus aureus NEGATIVE NEGATIVE Final    Comment: (NOTE) The Xpert SA Assay (FDA approved for NASAL specimens in patients 77 years of age and older), is one component of a comprehensive surveillance program. It is not intended to diagnose infection nor to guide or monitor treatment.          Radiology Studies: Ct Head Wo Contrast  Result Date: 02/20/2017 CLINICAL DATA:  Altered level of consciousness.  History of dementia EXAM: CT HEAD WITHOUT CONTRAST TECHNIQUE: Contiguous axial images were obtained from the base of the  skull through the vertex without intravenous contrast. COMPARISON:  February 17, 2017 FINDINGS: Brain: Moderate diffuse atrophy is stable. 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 With this limitation, there is no demonstrable mass, hemorrhage, extra-axial fluid collection, or midline shift. There is patchy small vessel disease in the centra semiovale bilaterally. There is evidence of a prior small lacunar infarct in the anterior right lentiform nucleus, stable. Elsewhere gray-white compartments appear normal. No acute infarct is demonstrable on this study. Vascular: There is no hyperdense vessel. There are foci of bilateral carotid siphon arteriovascular calcification. Skull: Bony calvarium appears intact. Degenerative changes noted in the temporomandibular joints. Sinuses/Orbits: There is opacification in Areas of the posterior left ethmoid sinus. There is also mild opacification in the lateral left sphenoid sinus region. Other visualized paranasal sinuses are clear. Visualized orbits appear symmetric bilaterally. Other: Mastoids on the left are clear. There is opacification on the right in  the area of the cochlear implant. No air-fluid level noted in the right sphenoid region. IMPRESSION: 1. 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 Stable opacification in portions the right mastoid air cell region, likely of postoperative etiology. 2. Stable atrophy with periventricular small vessel disease. Prior small lacunar infarct in the anterior right lentiform nucleus. No evident acute infarct. No intracranial mass hemorrhage, or extra-axial fluid collection. 3.  Degenerative change in the temporomandibular joints. 4. Paranasal sinus disease, most notably in the posterior left ethmoid air cell region. 5.  Foci of arterial vascular calcification noted. Electronically Signed   By: Bretta Bang III M.D.   On: 02/20/2017 12:13   Dg C-arm 1-60 Min  Result Date:  02/18/2017 CLINICAL DATA:  Intramedullary fixation of left femur. EXAM: LEFT FEMUR 2 VIEWS; DG C-ARM 61-120 MIN Fluoroscopy time: 58 seconds. COMPARISON:  Radiographs of February 17, 2017. FINDINGS: Four intraoperative fluoroscopic images of the left femur demonstrate the patient be status post surgical internal fixation intertrochanteric fracture of proximal left femur with intramedullary rod fixation of the femoral shaft. IMPRESSION: Status post surgical internal fixation of proximal left intertrochanteric femoral fracture. Electronically Signed   By: Lupita Raider, M.D.   On: 02/18/2017 15:57   Dg Femur Min 2 Views Left  Result Date: 02/18/2017 CLINICAL DATA:  Intramedullary fixation of left femur. EXAM: LEFT FEMUR 2 VIEWS; DG C-ARM 61-120 MIN Fluoroscopy time: 58 seconds. COMPARISON:  Radiographs of February 17, 2017. FINDINGS: Four intraoperative fluoroscopic images of the left femur demonstrate the patient be status post surgical internal fixation intertrochanteric fracture of proximal left femur with intramedullary rod fixation of the femoral shaft. IMPRESSION: Status post surgical internal fixation of proximal left intertrochanteric femoral fracture. Electronically Signed   By: Lupita Raider, M.D.   On: 02/18/2017 15:57        Scheduled Meds: . divalproex  125 mg Oral Q12H  . donepezil  10 mg Oral QHS  . enoxaparin (LOVENOX) injection  40 mg Subcutaneous Q24H  . feeding supplement (GLUCERNA SHAKE)  237 mL Oral BID BM  . insulin aspart  0-9 Units Subcutaneous TID WC  . insulin aspart  5 Units Intravenous Once  . insulin glargine  30 Units Subcutaneous Daily  . levothyroxine  100 mcg Oral QAC breakfast  . magnesium oxide  200 mg Oral Daily  . mirtazapine  7.5 mg Oral QHS  . multivitamin with minerals  1 tablet Oral QPM  . naLOXone (NARCAN)  injection  0.2 mg Intravenous Once  . potassium chloride  10 mEq Oral BID  . QUEtiapine  50 mg Oral QHS  . vitamin B-12  1,000 mcg Oral QPM   . vitamin C  500 mg Oral QPM   Continuous Infusions:   LOS: 2 days    Rosiland Sen Jaynie Collins, MD [facility] Pager [phone]  If 7PM-7AM, please contact night-coverage [email or web address] Password [facility] 02/20/2017, 12:39 PM

## 2017-02-20 NOTE — Progress Notes (Signed)
   Assessment / Plan: 2 Days Post-Op  S/P Procedure(s) (LRB): INTRAMEDULLARY (IM) NAIL FEMORAL (Left) by Dr. Jewel Baizeimothy D. Eulah PontMurphy on 02/18/17  Principal Problem:   Closed intertrochanteric fracture of left femur (HCC) Active Problems:   Dementia   Hypothyroidism   Diabetes mellitus, type 2 (HCC)   Fall   Hip fracture (HCC)   Closed fracture of right proximal radius and ulna   Closed fracture of proximal end of right humerus  Acute Blood Loss Anemia: Improved after transfusion.  Hgb 9.6 < 6.0    Closed left intertrochanteric femur fracture Stable.  Dressings c/d/i.  Pain appears to be minimal.   Minimize narcotics for pain.  Tylenol then Norco for breakthrough.  Closed right proximal humerus, radius, and ulna fractures: Continue nonoperative management  Sling for comfort. Nonweightbearing right upper extremity   Mobilize with therapy when able Incentive Spirometry Apply ice PRN  Weight Bearing: WBAT LLE.  NWB RUE Dressings: PRN.  VTE prophylaxis: Lovenox, SCDs Dispo: Skilled Nursing Facility/Rehab planned for today.  Stable from an orthopedic perspective.  Follow up in the office with Dr. Wandra Feinstein. Murphy in 2 weeks.  Please call with questions.  Objective:   VITALS:   Vitals:   02/19/17 1237 02/19/17 1529 02/19/17 2139 02/20/17 0542  BP: (!) 173/73 (!) 170/62 (!) 143/48 (!) 166/76  Pulse: (!) 109 (!) 103 (!) 102 94  Resp: 16 18 16 16   Temp: 98.6 F (37 C) 98.8 F (37.1 C) 99.5 F (37.5 C) 99 F (37.2 C)  TempSrc: Oral Oral Axillary Axillary  SpO2: 93% 92% 93% 92%   CBC Latest Ref Rng & Units 02/20/2017 02/19/2017 02/18/2017  WBC 4.0 - 10.5 K/uL 8.7 10.4 13.3(H)  Hemoglobin 12.0 - 15.0 g/dL 7.8(G9.6(L) 6.0(LL) 8.2(L)  Hematocrit 36.0 - 46.0 % 29.3(L) 20.2(L) 26.5(L)  Platelets 150 - 400 K/uL 117(L) 232 276   BMP Latest Ref Rng & Units 02/20/2017 02/19/2017 02/18/2017  Glucose 65 - 99 mg/dL 956(O220(H) 130(Q242(H) 657(Q481(H)  BUN 6 - 20 mg/dL 46(N21(H) 62(X31(H) 52(W28(H)  Creatinine 0.44 - 1.00  mg/dL 4.13(K1.15(H) 4.40(N1.45(H) 0.27(O1.51(H)  Sodium 135 - 145 mmol/L 140 141 138  Potassium 3.5 - 5.1 mmol/L 3.9 4.2 4.6  Chloride 101 - 111 mmol/L 108 109 105  CO2 22 - 32 mmol/L 25 24 21(L)  Calcium 8.9 - 10.3 mg/dL 7.7(L) 7.8(L) 8.2(L)   Intake/Output      09/11 0701 - 09/12 0700 09/12 0701 - 09/13 0700   P.O. 120    Blood 740    Total Intake 860     Urine 400    Total Output 400     Net +460          Urine Occurrence 1 x      Physical Exam: General: NAD.  Sleeping soundly.  Donora in place 2L, O2 97%.   MSK Exam limited as patient is not cooperative during exam, has dementia, and is extremely hard of hearing. Dressings C/D/I LLE: Feet warm.  Responds to stimuli distally. RUE: In sling.  Hand warm.  Swelling improved.  Lucretia KernHenry Calvin Martensen III, PA-C 02/20/2017, 7:52 AM

## 2017-02-20 NOTE — Clinical Social Work Note (Addendum)
Clinical Social Work Assessment  Patient Details  Name: Sheena [name] MRN: [id number] Date of Birth: Oct 18, 1940  Date of referral:  02/20/17               Reason for consult:  Facility Placement                Permission sought to share information with:  Oceanographeracility Contact Representative Permission granted to share information::  Yes, Verbal Permission Granted  Name::     Sheena [name]  Agency::  [facility]  Relationship::  son  Contact Information:     Housing/Transportation Living arrangements for the past 2 months:  [facility] Source of Information:  Adult Children, Facility Patient Interpreter Needed:  None Criminal Activity/Legal Involvement Pertinent to Current Situation/Hospitalization:  No - Comment as needed Significant Relationships:  Adult Children Lives with:  Facility Resident Do you feel safe going back to the place where you live?  Yes Need for family participation in patient care:  Yes (Comment)  Care giving concerns:  [facility] and had [facility]. Pt will return to facility and receive skilled nursing care. CSW spoke with son who is in agreement.  Social Worker assessment / plan:  CSW will assist with transition back to SNF. Family in agreement. Family has experience with SNF process and asked minimal questions. CS[facility] obtained [facility].   FL2 complete. Passr confirmed. Offer sent.  Employment status:  Retired Health and safety inspectornsurance information:  Medicare PT Recommendations:  Skilled Nursing Facility Information / Referral to community resources:  Skilled Nursing Facility  Patient/Family's Response to care:  Family appreciative of CSW assistance with returning to facility to receive short term rehab. No issues or concerns identified.  Patient/Family's Understanding of and Emotional Response to Diagnosis, Current Treatment, and Prognosis:  Patient/family has good understanding of diagnosis, current treatment  and prognosis. Family hopeful that short term rehab will address impairment. No issues or concerns identified.  Emotional Assessment Appearance:  Appears stated age Attitude/Demeanor/Rapport:   (Cooperative) Affect (typically observed):  Accepting, Appropriate Orientation:   (Responds to voice) Alcohol / Substance use:  Not Applicable Psych involvement (Current and /or in the community):  No (Comment)  Discharge Needs  Concerns to be addressed:  Care Coordination Readmission within the last 30 days:  No Current discharge risk:  Dependent with Mobility, Physical Impairment Barriers to Discharge:  No Barriers Identified   Tresa Mooreatricia V Canisha Issac, LCSW 02/20/2017, 1:19 PM

## 2017-02-20 NOTE — Progress Notes (Signed)
On assessment this morning, pt is lethargic and only arousable to pain, does not follow commands. Unsure of baseline and no family present. Her VSS (see flowsheet), and CBG 226. Dr. Ronalee BeltsBhandari notified. Will monitor.   WoodlynneHudson, Latricia HeftKorie G

## 2017-02-21 DIAGNOSIS — D696 Thrombocytopenia, unspecified: Secondary | ICD-10-CM

## 2017-02-21 LAB — CBC
HCT: 27.8 % — ABNORMAL LOW (ref 36.0–46.0)
Hemoglobin: 9.1 g/dL — ABNORMAL LOW (ref 12.0–15.0)
MCH: 28.8 pg (ref 26.0–34.0)
MCHC: 32.7 g/dL (ref 30.0–36.0)
MCV: 88 fL (ref 78.0–100.0)
PLATELETS: 112 10*3/uL — AB (ref 150–400)
RBC: 3.16 MIL/uL — AB (ref 3.87–5.11)
RDW: 15.6 % — ABNORMAL HIGH (ref 11.5–15.5)
WBC: 7.6 10*3/uL (ref 4.0–10.5)

## 2017-02-21 LAB — BASIC METABOLIC PANEL
Anion gap: 9 (ref 5–15)
BUN: 20 mg/dL (ref 6–20)
CHLORIDE: 105 mmol/L (ref 101–111)
CO2: 25 mmol/L (ref 22–32)
CREATININE: 1.11 mg/dL — AB (ref 0.44–1.00)
Calcium: 8 mg/dL — ABNORMAL LOW (ref 8.9–10.3)
GFR calc Af Amer: 54 mL/min — ABNORMAL LOW (ref >60)
GFR, EST NON AFRICAN AMERICAN: 47 mL/min — AB (ref >60)
GLUCOSE: 161 mg/dL — AB (ref 65–99)
POTASSIUM: 3.7 mmol/L (ref 3.5–5.1)
Sodium: 139 mmol/L (ref 135–145)

## 2017-02-21 LAB — GLUCOSE, CAPILLARY
GLUCOSE-CAPILLARY: 172 mg/dL — AB (ref 65–99)
Glucose-Capillary: 143 mg/dL — ABNORMAL HIGH (ref 65–99)
Glucose-Capillary: 170 mg/dL — ABNORMAL HIGH (ref 65–99)
Glucose-Capillary: 189 mg/dL — ABNORMAL HIGH (ref 65–99)
Glucose-Capillary: 195 mg/dL — ABNORMAL HIGH (ref 65–99)
Glucose-Capillary: 208 mg/dL — ABNORMAL HIGH (ref 65–99)
Glucose-Capillary: 227 mg/dL — ABNORMAL HIGH (ref 65–99)

## 2017-02-21 NOTE — Evaluation (Addendum)
Occupational Therapy Evaluation Patient Details Name: Sheena [name] MRN: [id number] DOB: 04/29/1941 Today's Date: 02/21/2017    History of Present Illness Pt is a 76 y.o. female admitted on 02/17/17 from [facility] with dementia, frequent falls, hypothyroidism, HTN, DM, very hard of hearing who presents to the ER after a fall while getting out of her wheelchair. Found to have R humeral neck fx, R radial neck fx, R proximal ulnar fx, and L femur fx. Now s/p R femur IM nail on 9/10. Non-operative management of RUE fxs. Pt lertharic post-op, but CT head with no acute finding.     Clinical Impression   This 76 y/o F presents with the above. Pt nonverbal through most of session (suspect due to both [facility] and baseline cognitive impairments) thought does express when she is in pain. Pt resides in SNF, per chart review was receiving assistance for ADL completion. Pt currently requires total assist for ADLs and required +2 total assist to sit EOB largely due to pain in LLE with movement. Pt will benefit from continued acute OT services and recommend additional OT services in SNF setting after discharge to progress Pt's safety and independence with ADLs and functional mobility.     Follow Up Recommendations  SNF    Equipment Recommendations  Other (comment) (to be determined in next venue )           Precautions / Restrictions Precautions Precautions: Fall Required Braces or Orthoses: Sling (RUE sling for comfort ) Restrictions Weight Bearing Restrictions: Yes RUE Weight Bearing: Non weight bearing LLE Weight Bearing: Weight bearing as tolerated      Mobility Bed Mobility Overal bed mobility: Needs Assistance Bed Mobility: Supine to Sit;Sit to Supine     Supine to sit: Total assist;+2 for physical assistance;HOB elevated Sit to supine: Total assist;+2 for physical assistance;HOB elevated   General bed mobility comments: Pt required totalA for supine<>sit and scooting up in bed, with  additional assistance from nurse. Pt unwilling to participate secondary to LLE pain with all mobility stating "don't move me" despite max education. Able to sit EOB ~5 min with maxA+2 secondary to pt attempting to lay back down.                        Balance Overall balance assessment: Needs assistance Sitting-balance support: No upper extremity supported;Feet supported Sitting balance-Leahy Scale: Zero Sitting balance - Comments: MaxA+2 for sitting balance secondary to pt resistance (feel she could potentially have been min guard for sitting)                                   ADL either performed or assessed with clinical judgement   ADL Overall ADL's : Needs assistance/impaired Eating/Feeding: Bed level;Maximal assistance   Grooming: Maximal assistance;Bed level Grooming Details (indicate cue type and reason): Pt demonstrates ability to reach LUE towards face; however does not actively engage in grooming ADLs, does not respond to directions or cues  Upper Body Bathing: Total assistance;Bed level   Lower Body Bathing: Total assistance;Bed level   Upper Body Dressing : Total assistance;Bed level   Lower Body Dressing: Total assistance;Bed level;+2 for physical assistance;+2 for safety/equipment Lower Body Dressing Details (indicate cue type and reason): total assist to don socks     Toileting- Clothing Manipulation and Hygiene: Total assistance;Bed level;+2 for physical assistance;+2 for safety/equipment       Functional  mobility during ADLs: Total assistance;+2 for physical assistance;+2 for safety/equipment General ADL Comments: Pt with increased difficulty following commands or performing purposeful movements; requires totalA +2 for completing bed mobility to sit EOB; Pt with increased pain in LLE during movement      Vision   Additional Comments: Pt intermittently tracks and makes eye contact; often staring straight ahead and does not respond to  auditory or visual stimuli                 Pertinent Vitals/Pain Pain Assessment: Faces Faces Pain Scale: Hurts whole lot Pain Location: L hip and RUE with movement Pain Descriptors / Indicators: Grimacing;Moaning;Guarding Pain Intervention(s): Limited activity within patient's tolerance;Monitored during session;Repositioned          Extremity/Trunk Assessment Upper Extremity Assessment Upper Extremity Assessment: RUE deficits/detail;Difficult to assess due to impaired cognition RUE Deficits / Details: RUE fxs (in sling) RUE: Unable to fully assess due to pain;Unable to fully assess due to immobilization   Lower Extremity Assessment Lower Extremity Assessment: Defer to PT evaluation RLE Deficits / Details: R hip flexion & knee ext grossly 3/5 with functional movement (pt did not respond to command for active movement) LLE Deficits / Details: No active movement in LLE, most likely secondary to pain LLE: Unable to fully assess due to pain       Communication Communication Communication: HOH   Cognition Arousal/Alertness: Awake/alert Behavior During Therapy: Flat affect Overall Cognitive Status: No family/caregiver present to determine baseline cognitive functioning Area of Impairment: Following commands;Awareness;Problem solving;Safety/judgement                               General Comments: Pt very much awake with eyes open throughout session. Mostly non-verbal and not responding to therapists throughout session, except to say "ouch/that hurts," "don't move me," "stop it or I'll slap you." Did not follow any commands or perform automatic movement (i.e. shaking hands); unsure if this all is because she is extremely HOH, due to her history of dementia or other underlying cognitive deficits, or if pt simply did not want to work with therapy secondary to pain.                    Home Living Family/patient expects to be discharged to:: Skilled nursing  facility                                 Additional Comments: Pt not very verbal; did not answer questions regarding PLOF. Per note, admitted from [facility]      Prior Functioning/Environment Level of Independence: Needs assistance                 OT Problem List: Decreased strength;Decreased range of motion;Decreased activity tolerance;Impaired balance (sitting and/or standing);Decreased cognition;Decreased knowledge of use of DME or AE;Pain;Decreased knowledge of precautions;Impaired UE functional use      OT Treatment/Interventions: Self-care/ADL training;DME and/or AE instruction;Therapeutic activities;Balance training;Therapeutic exercise;Energy conservation;Patient/family education    OT Goals(Current goals can be found in the care plan section) Acute Rehab OT Goals Patient Stated Goal: none stated OT Goal Formulation: Patient unable to participate in goal setting Time For Goal Achievement: 03/07/17 Potential to Achieve Goals: Fair ADL Goals Pt Will Perform Eating: with min assist;bed level Pt Will Perform Grooming: with min assist;bed level Pt Will Perform Upper Body Bathing: with mod assist;bed level  Pt Will Perform Upper Body Dressing: with mod assist;bed level Additional ADL Goal #1: Pt will complete bed mobility with ModA in preparation for seated/EOB  ADLs.  Additional ADL Goal #2: Pt will maintain static sitting EOB with ModA for ADL task completion.   OT Frequency: Min 2X/week               Co-evaluation PT/OT/SLP Co-Evaluation/Treatment: Yes Reason for Co-Treatment: For patient/therapist safety;Necessary to address cognition/behavior during functional activity;To address functional/ADL transfers PT goals addressed during session: Mobility/safety with mobility OT goals addressed during session: ADL's and self-care      AM-PAC PT "6 Clicks" Daily Activity     Outcome Measure Help from another person eating meals?: A Lot Help from  another person taking care of personal grooming?: A Lot Help from another person toileting, which includes using toliet, bedpan, or urinal?: Total Help from another person bathing (including washing, rinsing, drying)?: A Lot Help from another person to put on and taking off regular upper body clothing?: Total Help from another person to put on and taking off regular lower body clothing?: Total 6 Click Score: 9   End of Session Nurse Communication: Mobility status  Activity Tolerance: Patient limited by pain;Treatment limited secondary to agitation Patient left: in bed;with call bell/phone within reach;with bed alarm set  OT Visit Diagnosis: Other abnormalities of gait and mobility (R26.89);Pain Pain - Right/Left: Left Pain - part of body: Arm;Hip                Time: [id number] OT Time Calculation (min): 18 min Charges:  OT General Charges $OT Visit: 1 Visit OT Evaluation $OT Eval Moderate Complexity: 1 Mod G-Codes:     Marcy SirenBreanna Clemons Salvucci, OT Pager [phone] 02/21/2017   Sheena [name] 02/21/2017, 1:42 PM

## 2017-02-21 NOTE — NC FL2 (Signed)
[place] MEDICAID FL2 LEVEL OF CARE SCREENING TOOL     IDENTIFICATION  Patient Name: Sheena [name] Birthdate: 05-06-1941 Sex: female Admission Date (Current Location): 02/17/2017  [facility] and [place] Number:      Facility and Address:  [facility], [address] [place], [place] [address]      Provider Number: [id number]  Attending Physician Name and Address:  Maxie Barb, MD  Relative Name and Phone Number:  Juanetta Negash, son, [phone]    Current Level of Care: Hospital Recommended Level of Care: Skilled Nursing Facility Prior Approval Number:    Date Approved/Denied: 02/20/17 PASRR Number: [id number]  Discharge Plan: SNF    Current Diagnoses: Patient Active Problem List   Diagnosis Date Noted  . Thrombocytopenia (HCC)   . Acute metabolic encephalopathy   . Hip fracture (HCC) 02/18/2017  . Closed intertrochanteric fracture of left femur (HCC) 02/18/2017  . Closed fracture of right proximal radius and ulna 02/18/2017  . Closed fracture of proximal end of right humerus 02/18/2017  . Fall 09/25/2016  . Acute encephalopathy 09/25/2016  . Wrist fracture, closed, right, initial encounter 09/25/2016  . Hypoglycemia 09/25/2016  . Normocytic anemia 09/25/2016  . Abdominal pain 09/25/2016  . Closed fracture of right wrist   . Psychosis 12/22/2012  . High anion gap metabolic acidosis 12/17/2012  . DKA (diabetic ketoacidoses) (HCC) 12/17/2012  . Altered mental status 12/17/2012  . Severe Diabetic gastroparesis 12/17/2012  . H/O diarrhea 12/17/2012  . Dementia 12/17/2012  . Hypothyroidism 12/17/2012  . Diabetes mellitus, type 2 (HCC) 12/17/2012  . Hypokalemia 12/17/2012  . Dehydration 12/17/2012  . Chronic pain 12/17/2012    Orientation RESPIRATION BLADDER Height & Weight      (responds to voice)  O2 (Nasal Cannula 1L) Incontinent, Indwelling catheter Weight:   Height:     BEHAVIORAL SYMPTOMS/MOOD NEUROLOGICAL BOWEL  NUTRITION STATUS      Continent Diet (See Dc Summary)  AMBULATORY STATUS COMMUNICATION OF NEEDS Skin   Extensive Assist Verbally Surgical wounds (Silicone Dressing)                       Personal Care Assistance Level of Assistance  Dressing, Bathing, Feeding Bathing Assistance: Maximum assistance Feeding assistance: Maximum assistance Dressing Assistance: Maximum assistance     Functional Limitations Info  Hearing   Hearing Info: Impaired      SPECIAL CARE FACTORS FREQUENCY  PT (By licensed PT), OT (By licensed OT)     PT Frequency: 2x week OT Frequency: 2x week            Contractures      Additional Factors Info  Code Status, Allergies, Insulin Sliding Scale Code Status Info: DNR Allergies Info: BACTRIM    Insulin Sliding Scale Info: Insulin daily       Current Medications (02/21/2017):  This is the current hospital active medication list Current Facility-Administered Medications  Medication Dose Route Frequency Provider Last Rate Last Dose  . 0.9 %  sodium chloride infusion   Intravenous Continuous Maxie Barb, MD 50 mL/hr at 02/20/17 1326    . acetaminophen (TYLENOL) tablet 650 mg  650 mg Oral Q4H PRN Rizwan, Ladell Heads, MD      . divalproex (DEPAKOTE SPRINKLE) capsule 125 mg  125 mg Oral Q12H Legrand Pitts, RPH   125 mg at 02/21/17 8469  . donepezil (ARICEPT) tablet 10 mg  10 mg Oral QHS Calvert Cantor, MD   10 mg at  02/20/17 2100  . feeding supplement (GLUCERNA SHAKE) (GLUCERNA SHAKE) liquid 237 mL  237 mL Oral BID BM Calvert Cantorizwan, Saima, MD   237 mL at 02/21/17 0955  . insulin aspart (novoLOG) injection 0-9 Units  0-9 Units Subcutaneous TID WC Calvert Cantorizwan, Saima, MD   3 Units at 02/21/17 1325  . insulin aspart (novoLOG) injection 5 Units  5 Units Intravenous Once Emokpae, Ejiroghene E, MD      . insulin glargine (LANTUS) injection 15 Units  15 Units Subcutaneous Daily Maxie BarbBhandari, Dron Prasad, MD   15 Units at 02/21/17 [phone]  . labetalol (NORMODYNE,TRANDATE)  injection 10 mg  10 mg Intravenous Q2H PRN Emokpae, Ejiroghene E, MD   10 mg at 02/20/17 1621  . levothyroxine (SYNTHROID, LEVOTHROID) injection 75 mcg  75 mcg Intravenous Daily Maxie BarbBhandari, Dron Prasad, MD   75 mcg at 02/21/17 0940  . magnesium oxide (MAG-OX) tablet 200 mg  200 mg Oral Daily Calvert Cantorizwan, Saima, MD   200 mg at 02/21/17 0942  . mirtazapine (REMERON) tablet 7.5 mg  7.5 mg Oral QHS Calvert Cantorizwan, Saima, MD   7.5 mg at 02/20/17 2101  . multivitamin with minerals tablet 1 tablet  1 tablet Oral QPM Calvert Cantorizwan, Saima, MD   1 tablet at 02/19/17 1625  . ondansetron (ZOFRAN) tablet 4 mg  4 mg Oral Q6H PRN Emokpae, Ejiroghene E, MD       Or  . ondansetron (ZOFRAN) injection 4 mg  4 mg Intravenous Q6H PRN Emokpae, Ejiroghene E, MD      . potassium chloride (K-DUR,KLOR-CON) CR tablet 10 mEq  10 mEq Oral BID Calvert Cantorizwan, Saima, MD   10 mEq at 02/21/17 0943  . QUEtiapine (SEROQUEL) tablet 50 mg  50 mg Oral QHS Calvert Cantorizwan, Saima, MD   50 mg at 02/20/17 2101  . senna-docusate (Senokot-S) tablet 1 tablet  1 tablet Oral QHS PRN Emokpae, Ejiroghene E, MD      . vitamin B-12 (CYANOCOBALAMIN) tablet 1,000 mcg  1,000 mcg Oral QPM Calvert Cantorizwan, Saima, MD   1,000 mcg at 02/19/17 1625  . vitamin C (ASCORBIC ACID) tablet 500 mg  500 mg Oral QPM Calvert Cantorizwan, Saima, MD   500 mg at 02/19/17 1625     Discharge Medications: Please see discharge summary for a list of discharge medications.  Relevant Imaging Results:  Relevant Lab Results:   Additional Information SS#:[id number]  Eunice Winecoff V Rojelio Uhrich, LCSW

## 2017-02-21 NOTE — Progress Notes (Addendum)
PROGRESS NOTE    Sheena [name]  WUJ:[id number] DOB: [birth year] DOA: 02/17/2017 PCP: Georgann Housekeeper, MD   Brief Narrative: 76 y/o from [facility] with dementia, frequent falls, hypothyroidism, HTN, very hard of hearing who presents to the ER after a fall while getting out of her wheelchair.  Found to have  - comminuted and impacted fracture of the surgical neck of the right humerus - acute fractures right radial neck and proximal ulnar - acute comminuted fractures of the intertrochanteric and subtrochanteric left femur.  Assessment & Plan:   # Closed intertrochanteric fracture of left femur: Status post intermedullary nailing by orthopedics on September 10. Patient was lethargic this morning and therefore discontinued pain medication. -PT OT evaluation and supportive care. -Follow-up orthopedic's plan.  #  Closed fracture of right proximal radius and ulna     Closed fracture of proximal end of right humerus - nonoperative management at this time. - Sling for comfort. - Nonweightbearing right upper extremity  #Acute encephalopathy likely a metabolic versus toxic: Patient has been lethargic after the surgery. She was alert, opened her eyes with the name today. As per RN, she ate some. CT head with no acute finding. Patient may have gradual improvement in mental status and slow recovery from sedatives/in his PCP. Off narcotics for now. Continue to monitor mental status. I have discussed with the patient's son and updated him. Continue to monitor. Also discussed with the patient starts at bedside.  #Acute blood loss anemia: Received 2 units of red blood cell. Hemoglobin 9.1. No sign of active bleeding. Monitor lab.  #Type 2 diabetes: Monitor blood sugar level. I lowered the dose of Lantus to 15 units because patient is not eating much.  #Acute kidney injury: Serum creatinine level improved.  #History of dementia without behavioral issue: Continue Aricept and Depakote when able to take  orally. Monitor mental status  #Hypothyroidism: TSH level significantly elevated. He therefore currently on iron dose of IV Synthroid. Recommended to check thyroid function test in 4-6 weeks.  #Thrombocytopenia: Platelet count 112 today. I'll discontinue Lovenox and monitor CBC.   DVT prophylaxis: Discontinue Lovenox. Order SCD. Code Status: DO NOT RESUSCITATE Family Communication: Discussed with the patient's son over the phone Disposition Plan: Currently admitted    Consultants:   Orthopedics  Procedures: Orthopedic SURGERY Antimicrobials: None  Subjective: Seen and examined at bedside. Patient was alert awake and opened her eyes with name today. Review of systems Limited.  Objective: Vitals:   02/20/17 1458 02/20/17 1800 02/20/17 2134 02/21/17 0526  BP: (!) 187/78 (!) 166/71 (!) 153/62 (!) 159/62  Pulse: 93 85 97 82  Resp: Temp: 98.1 F (36.7 C)  98.4 F (36.9 C) 98.3 F (36.8 C)  TempSrc: Oral  Axillary Axillary  SpO2: 94%  95% 98%    Intake/Output Summary (Last 24 hours) at 02/21/17 1116 Last data filed at 02/21/17 0554  Gross per 24 hour  Intake           229.17 ml  Output              650 ml  Net          -420.83 ml   There were no vitals filed for this visit.  Examination:  General exam: Elderly female lying on bed comfortable, open her eyes with the name  Respiratory system: Clear bilateral. Respiratory effort normal. No wheezing or crackle Cardiovascular system: Regular rate rhythm S1 and S2 normal.  No pedal  edema. Gastrointestinal system: Abdomen is nondistended, soft and nontender. Normal bowel sounds heard. Central nervous system: Not following commands Skin: No rashes, lesions or ulcers Psychiatry: Unable to assess    Data Reviewed: I have personally reviewed following labs and imaging studies  CBC:  Recent Labs Lab 02/17/17 2207 02/18/17 0302 02/19/17 0534 02/20/17 0409 02/21/17 0401  WBC 13.5* 13.3* 10.4 8.7 7.6    NEUTROABS 11.1*  --   --   --   --   HGB 9.8* 8.2* 6.0* 9.6* 9.1*  HCT 31.4* 26.5* 20.2* 29.3* 27.8*  MCV 89.0 89.2 91.4 85.9 88.0  PLT 241 276 232 117* 112*   Basic Metabolic Panel:  Recent Labs Lab 02/17/17 2207 02/18/17 0302 02/19/17 0534 02/20/17 0409 02/21/17 0401  NA 135 138 141 140 139  K 4.6 4.6 4.2 3.9 3.7  CL 100* 105 109 108 105  CO2 22 21* 24 25 25   GLUCOSE 508* 481* 242* 220* 161*  BUN 27* 28* 31* 21* 20  CREATININE 1.46* 1.51* 1.45* 1.15* 1.11*  CALCIUM 8.5* 8.2* 7.8* 7.7* 8.0*   GFR: CrCl cannot be calculated (Unknown ideal weight.). Liver Function Tests:  Recent Labs Lab 02/17/17 2207  AST 22  ALT 19  ALKPHOS 127*  BILITOT 1.1  PROT 6.0*  ALBUMIN 3.3*   No results for input(s): LIPASE, AMYLASE in the last 168 hours. No results for input(s): AMMONIA in the last 168 hours. Coagulation Profile: No results for input(s): INR, PROTIME in the last 168 hours. Cardiac Enzymes: No results for input(s): CKTOTAL, CKMB, CKMBINDEX, TROPONINI in the last 168 hours. BNP (last 3 results) No results for input(s): PROBNP in the last 8760 hours. HbA1C: No results for input(s): HGBA1C in the last 72 hours. CBG:  Recent Labs Lab 02/20/17 1600 02/20/17 2018 02/21/17 0024 02/21/17 0530 02/21/17 0754  GLUCAP 139* 125* 143* 195* 189*   Lipid Profile: No results for input(s): CHOL, HDL, LDLCALC, TRIG, CHOLHDL, LDLDIRECT in the last 72 hours. Thyroid Function Tests:  Recent Labs  02/20/17 1330  TSH 50.012*   Anemia Panel:  Recent Labs  02/19/17 2006  VITAMINB12 1,308*  FOLATE 23.2  FERRITIN 25  TIBC 420  IRON 354*  RETICCTPCT 3.4*   Sepsis Labs: No results for input(s): PROCALCITON, LATICACIDVEN in the last 168 hours.  Recent Results (from the past 240 hour(s))  Surgical pcr screen     Status: None   Collection Time: 02/18/17  3:34 AM  Result Value Ref Range Status   MRSA, PCR NEGATIVE NEGATIVE Final   Staphylococcus aureus NEGATIVE  NEGATIVE Final    Comment: (NOTE) The Xpert SA Assay (FDA approved for NASAL specimens in patients 77 years of age and older), is one component of a comprehensive surveillance program. It is not intended to diagnose infection nor to guide or monitor treatment.          Radiology Studies: Ct Head Wo Contrast  Result Date: 02/20/2017 CLINICAL DATA:  Altered level of consciousness.  History of dementia EXAM: CT HEAD WITHOUT CONTRAST TECHNIQUE: Contiguous axial images were obtained from the base of the skull through the vertex without intravenous contrast. COMPARISON:  February 17, 2017 FINDINGS: Brain: Moderate diffuse atrophy is stable. 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 With this limitation, there is no demonstrable mass, hemorrhage, extra-axial fluid collection, or midline shift. There is patchy small vessel disease in the centra semiovale bilaterally. There is evidence of a prior small lacunar infarct in the anterior  right lentiform nucleus, stable. Elsewhere gray-white compartments appear normal. No acute infarct is demonstrable on this study. Vascular: There is no hyperdense vessel. There are foci of bilateral carotid siphon arteriovascular calcification. Skull: Bony calvarium appears intact. Degenerative changes noted in the temporomandibular joints. Sinuses/Orbits: There is opacification in Areas of the posterior left ethmoid sinus. There is also mild opacification in the lateral left sphenoid sinus region. Other visualized paranasal sinuses are clear. Visualized orbits appear symmetric bilaterally. Other: Mastoids on the left are clear. There is opacification on the right in the area of the cochlear implant. No air-fluid level noted in the right sphenoid region. IMPRESSION: 1. 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 Stable opacification in portions the right mastoid air cell region, likely of postoperative etiology. 2. Stable atrophy with  periventricular small vessel disease. Prior small lacunar infarct in the anterior right lentiform nucleus. No evident acute infarct. No intracranial mass hemorrhage, or extra-axial fluid collection. 3.  Degenerative change in the temporomandibular joints. 4. Paranasal sinus disease, most notably in the posterior left ethmoid air cell region. 5.  Foci of arterial vascular calcification noted. Electronically Signed   By: Bretta Bang III M.D.   On: 02/20/2017 12:13        Scheduled Meds: . divalproex  125 mg Oral Q12H  . donepezil  10 mg Oral QHS  . enoxaparin (LOVENOX) injection  40 mg Subcutaneous Q24H  . feeding supplement (GLUCERNA SHAKE)  237 mL Oral BID BM  . insulin aspart  0-9 Units Subcutaneous TID WC  . insulin aspart  5 Units Intravenous Once  . insulin glargine  15 Units Subcutaneous Daily  . levothyroxine  75 mcg Intravenous Daily  . magnesium oxide  200 mg Oral Daily  . mirtazapine  7.5 mg Oral QHS  . multivitamin with minerals  1 tablet Oral QPM  . potassium chloride  10 mEq Oral BID  . QUEtiapine  50 mg Oral QHS  . vitamin B-12  1,000 mcg Oral QPM  . vitamin C  500 mg Oral QPM   Continuous Infusions: . sodium chloride 50 mL/hr at 02/20/17 1326     LOS: 3 days    Sheena [name] Jaynie Collins, MD [facility] Pager [phone]  If 7PM-7AM, please contact night-coverage [email or web address] Password [email or web address] 02/21/2017, 11:16 AM

## 2017-02-21 NOTE — Evaluation (Signed)
Physical Therapy Evaluation Patient Details Name: Sheena [name] MRN: [id number] DOB: 31-May-1941 Today's Date: 02/21/2017   History of Present Illness  Pt is a 76 y.o. female admitted on 02/17/17 from [facility] with dementia, frequent falls, hypothyroidism, HTN, DM, very hard of hearing who presents to the ER after a fall while getting out of her wheelchair. Found to have R humeral neck fx, R radial neck fx, R proximal ulnar fx, and L femur fx. Now s/p R femur IM nail on 9/10. Non-operative management of RUE fxs. Pt lertharic post-op, but CT head with no acute finding.      Clinical Impression  Pt presents with pain and an overall decrease in functional mobility secondary to above. Per MD note, pt admitted from [facility] with history of dementia. Pt not verbal most of session and did not answer questions regarding PLOF, unsure if this due to significant HOH, dementia, or other reasons; no family present to determine baseline. Pt required totalA for bed mobility and maxA+2 to sit EOB; further mobility limited by increased LLE pain and pt agitation with mobility. Pt would benefit from continued acute PT services to maximize functional mobility and independence prior to d/c with SNF-level therapies.     Follow Up Recommendations SNF;Supervision/Assistance - 24 hour    Equipment Recommendations  Other (comment) (defer to next venue)    Recommendations for Other Services       Precautions / Restrictions Precautions Precautions: Fall Required Braces or Orthoses: Sling (RUE sling for comfort) Restrictions Weight Bearing Restrictions: Yes RUE Weight Bearing: Non weight bearing LLE Weight Bearing: Weight bearing as tolerated      Mobility  Bed Mobility Overal bed mobility: Needs Assistance Bed Mobility: Supine to Sit;Sit to Supine     Supine to sit: Total assist;+2 for physical assistance;HOB elevated Sit to supine: Total assist;+2 for physical assistance;HOB elevated   General  bed mobility comments: Pt required totalA for supine<>sit and scooting up in bed, with additional assistance from nurse. Pt unwilling to participate secondary to LLE pain with all mobility stating "don't move me" despite max education. Able to sit EOB ~5 min with maxA+2 secondary to pt attempting to lay back down.  Transfers                    Ambulation/Gait                Stairs            Wheelchair Mobility    Modified Rankin (Stroke Patients Only)       Balance Overall balance assessment: Needs assistance Sitting-balance support: No upper extremity supported;Feet supported Sitting balance-Leahy Scale: Poor Sitting balance - Comments: MaxA+2 for sitting balance secondary to pt resistance (feel she could potentially have been min guard for sitting)                                     Pertinent Vitals/Pain Pain Assessment: Faces Faces Pain Scale: Hurts whole lot Pain Location: L hip and RUE with movement Pain Descriptors / Indicators: Grimacing;Moaning Pain Intervention(s): Limited activity within patient's tolerance;Monitored during session    Home Living Family/patient expects to be discharged to:: Skilled nursing facility                 Additional Comments: Pt not very verbal; did not answer questions regarding PLOF. Per note, admitted from [facility]  SNF    Prior Function Level of Independence: Needs assistance               Hand Dominance        Extremity/Trunk Assessment   Upper Extremity Assessment Upper Extremity Assessment: Generalized weakness;RUE deficits/detail RUE Deficits / Details: RUE fxs (in sling) RUE: Unable to fully assess due to pain    Lower Extremity Assessment Lower Extremity Assessment: LLE deficits/detail;Generalized weakness;RLE deficits/detail RLE Deficits / Details: R hip flexion & knee ext grossly 3/5 with functional movement (pt did not respond to command for active movement) LLE  Deficits / Details: No active movement in LLE, most likely secondary to pain LLE: Unable to fully assess due to pain       Communication   Communication: HOH  Cognition Arousal/Alertness: Awake/alert Behavior During Therapy: Flat affect Overall Cognitive Status: No family/caregiver present to determine baseline cognitive functioning Area of Impairment: Following commands;Awareness;Problem solving;Safety/judgement                               General Comments: Pt very much awake with eyes open throughout session. Mostly non-verbal and not responding to therapists throughout session, except to say "ouch/that hurts," "don't move me," "stop it or I'll slap you." Did not follow any commands or perform automatic movement (i.e. shaking hands); unsure if this all is because she is extremely HOH, due to her history of dementia or other underlying cognitive deficits, or if pt simply did not want to work with therapy secondary to pain.      General Comments      Exercises     Assessment/Plan    PT Assessment Patient needs continued PT services  PT Problem List Decreased strength;Decreased range of motion;Decreased activity tolerance;Decreased balance;Decreased mobility;Decreased knowledge of use of DME;Decreased safety awareness;Decreased cognition;Decreased knowledge of precautions;Decreased skin integrity;Pain       PT Treatment Interventions DME instruction;Gait training;Stair training;Functional mobility training;Therapeutic activities;Therapeutic exercise;Balance training;Patient/family education;Wheelchair mobility training    PT Goals (Current goals can be found in the Care Plan section)  Acute Rehab PT Goals Patient Stated Goal: none stated PT Goal Formulation: With patient Time For Goal Achievement: 03/07/17    Frequency Min 3X/week   Barriers to discharge        Co-evaluation PT/OT/SLP Co-Evaluation/Treatment: Yes Reason for Co-Treatment: Necessary to address  cognition/behavior during functional activity;For patient/therapist safety;To address functional/ADL transfers PT goals addressed during session: Mobility/safety with mobility         AM-PAC PT "6 Clicks" Daily Activity  Outcome Measure Difficulty turning over in bed (including adjusting bedclothes, sheets and blankets)?: Unable Difficulty moving from lying on back to sitting on the side of the bed? : Unable Difficulty sitting down on and standing up from a chair with arms (e.g., wheelchair, bedside commode, etc,.)?: Unable Help needed moving to and from a bed to chair (including a wheelchair)?: Total Help needed walking in hospital room?: Total Help needed climbing 3-5 steps with a railing? : Total 6 Click Score: 6    End of Session   Activity Tolerance: Patient limited by pain;Treatment limited secondary to agitation Patient left: in bed;with call bell/phone within reach;with bed alarm set;with SCD's reapplied Nurse Communication: Mobility status PT Visit Diagnosis: Other abnormalities of gait and mobility (R26.89);Pain Pain - Right/Left: Left Pain - part of body: Hip    Time: [id number] PT Time Calculation (min) (ACUTE ONLY): 18 min   Charges:   PT Evaluation $  PT Eval Moderate Complexity: 1 Mod     PT G Codes:       Ina Homes, PT, DPT Acute Rehab Services  Pager: [phone]  Malachy Chamber 02/21/2017, 12:58 PM

## 2017-02-21 NOTE — Progress Notes (Signed)
   Assessment / Plan: 3 Days Post-Op  S/P Procedure(s) (LRB): INTRAMEDULLARY (IM) NAIL FEMORAL (Left) by Dr. Jewel Baizeimothy D. Eulah PontMurphy on 02/18/17  Principal Problem:   Closed intertrochanteric fracture of left femur (HCC) Active Problems:   Dementia   Hypothyroidism   Diabetes mellitus, type 2 (HCC)   Fall   Hip fracture (HCC)   Closed fracture of right proximal radius and ulna   Closed fracture of proximal end of right humerus   Acute metabolic encephalopathy  Acute Blood Loss Anemia: Improved after transfusion 02/19/17.  Hgb 9.6 on 02/20/17 < 6.0 on 02/19/17.   CBC still pending for 02/21/17.  Closed left intertrochanteric femur fracture Stable from an orthopedic perspective s/p IM Nail. Pain appears to be controlled without medications.   Minimize narcotics for pain.  Tylenol then Norco for breakthrough only. PT / OT evals to facilitate SNF placement.  Closed right proximal humerus, radius, and ulna fractures: Nonoperative management  Sling for comfort. Nonweightbearing right upper extremity  Continue management of comorbidities per Medicine Mobilize with therapy when able Bed/chair alarms dt fall risk / dementia Incentive Spirometry Apply ice PRN  Weight Bearing: WBAT LLE.  NWB RUE Dressings: PRN.  VTE prophylaxis: Lovenox, SCDs Dispo: Skilled Nursing Facility/Rehab when stable medically.  Stable from an orthopedic perspective.  Follow up in the office with Dr. Wandra Feinstein. Murphy in 2 weeks.  Please call with questions.  Objective:   VITALS:   Vitals:   02/20/17 1458 02/20/17 1800 02/20/17 2134 02/21/17 0526  BP: (!) 187/78 (!) 166/71 (!) 153/62 (!) 159/62  Pulse: 93 85 97 82  Resp: 16  16 16   Temp: 98.1 F (36.7 C)  98.4 F (36.9 C) 98.3 F (36.8 C)  TempSrc: Oral  Axillary Axillary  SpO2: 94%  95% 98%   CBC Latest Ref Rng & Units 02/20/2017 02/19/2017 02/18/2017  WBC 4.0 - 10.5 K/uL 8.7 10.4 13.3(H)  Hemoglobin 12.0 - 15.0 g/dL 1.6(X9.6(L) 6.0(LL) 8.2(L)  Hematocrit 36.0 -  46.0 % 29.3(L) 20.2(L) 26.5(L)  Platelets 150 - 400 K/uL 117(L) 232 276   BMP Latest Ref Rng & Units 02/21/2017 02/20/2017 02/19/2017  Glucose 65 - 99 mg/dL 096(E161(H) 454(U220(H) 981(X242(H)  BUN 6 - 20 mg/dL 20 91(Y21(H) 78(G31(H)  Creatinine 0.44 - 1.00 mg/dL 9.56(O1.11(H) 1.30(Q1.15(H) 6.57(Q1.45(H)  Sodium 135 - 145 mmol/L 139 140 141  Potassium 3.5 - 5.1 mmol/L 3.7 3.9 4.2  Chloride 101 - 111 mmol/L 105 108 109  CO2 22 - 32 mmol/L 25 25 24   Calcium 8.9 - 10.3 mg/dL 8.0(L) 7.7(L) 7.8(L)   Intake/Output      09/12 0701 - 09/13 0700 09/13 0701 - 09/14 0700   P.O. 20    I.V. 229.2    Total Intake 249.2     Urine 650    Total Output 650     Net -400.8          Urine Occurrence 1 x    Stool Occurrence 1 x      Physical Exam: General: NAD.  Sleeping soundly.  Firth in place.  No increased wob.  Not interactive.   MSK Exam limited as patient has advanced dementia and is extremely hard of hearing.  She is not cooperative with exam,  Dressings C/D/I w/ scant stable bloody drainage LLE: Feet warm.  Responds to stimuli distally. RUE: In sling.  Hand warm.  Swelling improving.  Lucretia KernHenry Calvin Martensen III, PA-C 02/21/2017, 7:10 AM

## 2017-02-21 NOTE — Progress Notes (Signed)
SLP Cancellation Note  Patient Details Name: Sheena [name] MRN: [id number] DOB: January 13, 1941   Cancelled treatment:       Reason Eval/Treat Not Completed: Fatigue/lethargy limiting ability to participate; pt unable to arouse to complete BSE; will continue efforts   Tressie StalkerPat Adams, M.S., CCC-SLP 02/21/2017, 3:07 PM

## 2017-02-22 DIAGNOSIS — S52001A Unspecified fracture of upper end of right ulna, initial encounter for closed fracture: Secondary | ICD-10-CM

## 2017-02-22 DIAGNOSIS — S72009A Fracture of unspecified part of neck of unspecified femur, initial encounter for closed fracture: Secondary | ICD-10-CM

## 2017-02-22 DIAGNOSIS — Z810 Family history of intellectual disabilities: Secondary | ICD-10-CM

## 2017-02-22 DIAGNOSIS — S52101A Unspecified fracture of upper end of right radius, initial encounter for closed fracture: Secondary | ICD-10-CM

## 2017-02-22 DIAGNOSIS — S72142A Displaced intertrochanteric fracture of left femur, initial encounter for closed fracture: Principal | ICD-10-CM

## 2017-02-22 DIAGNOSIS — S42201A Unspecified fracture of upper end of right humerus, initial encounter for closed fracture: Secondary | ICD-10-CM

## 2017-02-22 LAB — GLUCOSE, CAPILLARY
GLUCOSE-CAPILLARY: 177 mg/dL — AB (ref 65–99)
GLUCOSE-CAPILLARY: 279 mg/dL — AB (ref 65–99)
Glucose-Capillary: 167 mg/dL — ABNORMAL HIGH (ref 65–99)
Glucose-Capillary: 257 mg/dL — ABNORMAL HIGH (ref 65–99)

## 2017-02-22 LAB — CBC
HEMATOCRIT: 29.2 % — AB (ref 36.0–46.0)
HEMOGLOBIN: 9.7 g/dL — AB (ref 12.0–15.0)
MCH: 29 pg (ref 26.0–34.0)
MCHC: 33.2 g/dL (ref 30.0–36.0)
MCV: 87.4 fL (ref 78.0–100.0)
Platelets: 141 10*3/uL — ABNORMAL LOW (ref 150–400)
RBC: 3.34 MIL/uL — ABNORMAL LOW (ref 3.87–5.11)
RDW: 15 % (ref 11.5–15.5)
WBC: 6.7 10*3/uL (ref 4.0–10.5)

## 2017-02-22 NOTE — Progress Notes (Signed)
Physical Therapy Treatment Patient Details Name: Sheena [name] MRN: [id number] DOB: Oct 18, 1940 Today's Date: 02/22/2017    History of Present Illness Pt is a 76 y.o. female admitted on 02/17/17 from [facility] with dementia, frequent falls, hypothyroidism, HTN, DM, very hard of hearing who presents to the ER after a fall while getting out of her wheelchair. Found to have R humeral neck fx, R radial neck fx, R proximal ulnar fx, and L femur fx. Now s/p R femur IM nail on 9/10. Non-operative management of RUE fxs. Pt lertharic post-op, but CT head with no acute finding.      PT Comments    Pt required cues for movements to transfer to seated position edge of bed and to transfer into standing with use of stedy frame.  Pt fearful and anxious during session but thankful post treatment.  PTA readjusted sling and cleaned urine from patient with change of gown before transfer to chair from bed.  RN placed purewick after transfer to chair.     Follow Up Recommendations  SNF;Supervision/Assistance - 24 hour     Equipment Recommendations  Other (comment) (defer to next level of care. )    Recommendations for Other Services       Precautions / Restrictions Precautions Precautions: Fall Required Braces or Orthoses: Sling (RUE sling for comfort) Restrictions Weight Bearing Restrictions: Yes RUE Weight Bearing: Non weight bearing LLE Weight Bearing: Weight bearing as tolerated    Mobility  Bed Mobility Overal bed mobility: Needs Assistance Bed Mobility: Supine to Sit     Supine to sit: Total assist     General bed mobility comments: Pt required assistance to advance patient to edge of bed and to elevate trunk into sitting.  Pt with heavy hold to L railing in seated position.  Placed stedy frame in front of patient and able to advance her L hand to the stedy cross bar.    Transfers Overall transfer level: Needs assistance Equipment used:  (sara stedy) Transfers: Sit to/from  Stand Sit to Stand: Max assist;+2 physical assistance;Total assist         General transfer comment: PTA and nurse lifted patient into standing.  Patient able to forward weight shift and pull on stedy cross bar.  Pt fearful of falling during session.  Pt then transferred from stedy plates (seat) to standing to sit in recliner chair.    Ambulation/Gait Ambulation/Gait assistance:  (gait is not a safe intervention at this time.  )               Stairs            Wheelchair Mobility    Modified Rankin (Stroke Patients Only)       Balance Overall balance assessment: Needs assistance Sitting-balance support: No upper extremity supported;Feet supported Sitting balance-Leahy Scale: Poor Sitting balance - Comments: Pt able to hold to railing with min assist for posture as she fatigued she slumped forward likley how she postured in her WC.       Standing balance-Leahy Scale: Zero Standing balance comment: help lift patient into standing with bed pad.                              Cognition Arousal/Alertness: Awake/alert Behavior During Therapy: Flat affect;Agitated Overall Cognitive Status: Difficult to assess (dementia at baseline)  Exercises      General Comments        Pertinent Vitals/Pain Pain Assessment: Faces Faces Pain Scale: Hurts worst (with movement) Pain Location: L hip and RUE with movement Pain Descriptors / Indicators: Grimacing;Moaning;Guarding Pain Intervention(s): Monitored during session;Repositioned    Home Living                      Prior Function            PT Goals (current goals can now be found in the care plan section) Acute Rehab PT Goals Patient Stated Goal: none stated Progress towards PT goals: Progressing toward goals    Frequency    Min 3X/week      PT Plan Current plan remains appropriate    Co-evaluation              AM-PAC PT  "6 Clicks" Daily Activity  Outcome Measure  Difficulty turning over in bed (including adjusting bedclothes, sheets and blankets)?: Unable Difficulty moving from lying on back to sitting on the side of the bed? : Unable Difficulty sitting down on and standing up from a chair with arms (e.g., wheelchair, bedside commode, etc,.)?: Unable Help needed moving to and from a bed to chair (including a wheelchair)?: Total Help needed walking in hospital room?: Total Help needed climbing 3-5 steps with a railing? : Total 6 Click Score: 6    End of Session Equipment Utilized During Treatment: Gait belt Activity Tolerance: Patient limited by pain;Treatment limited secondary to agitation Patient left: with call bell/phone within reach;in chair Nurse Communication: Mobility status (informed nursing staff that patient's tray had arrived.  ) PT Visit Diagnosis: Other abnormalities of gait and mobility (R26.89);Pain Pain - Right/Left: Left Pain - part of body: Hip     Time: 6045-4098 PT Time Calculation (min) (ACUTE ONLY): 36 min  Charges:  $Therapeutic Activity: 23-37 mins                    G CodesJoycelyn Rua, PTA pager [phone]    Florestine Avers 02/22/2017, 1:42 PM

## 2017-02-22 NOTE — Evaluation (Signed)
Clinical/Bedside Swallow Evaluation Patient Details  Name: Sheena [name] MRN: [id number] Date of Birth: [birth year]  Today's Date: 02/22/2017 Time: SLP Start Time (ACUTE ONLY): 7829 SLP Stop Time (ACUTE ONLY): 0946 SLP Time Calculation (min) (ACUTE ONLY): 8 min  Past Medical History:  Past Medical History:  Diagnosis Date  . Dementia   . Diabetes mellitus   . Hypertension    Past Surgical History:  Past Surgical History:  Procedure Laterality Date  . BACK SURGERY    . FEMUR IM NAIL Left 02/18/2017   Procedure: INTRAMEDULLARY (IM) NAIL FEMORAL;  Surgeon: Sheral Apley, MD;  Location: [facility] OR;  Service: Orthopedics;  Laterality: Left;  . INNER EAR SURGERY    . NOSE SURGERY     HPI:  76 y/o from [facility] with dementia, frequent falls, hypothyroidism, HTN, very hard of hearing with cochlear implant who presents to the ER after a fall while getting out of her wheelchair. Found to have comminuted and impacted fracture of the surgical neck of the right humerus, acute fractures right radial neck and proximal ulnar, acute comminuted fractures of theintertrochanteric and subtrochanteric left femur.  Underwent surgery on 02/18/17.  Head CT negative.     Assessment / Plan / Recommendation Clinical Impression  Pt demonstrates normal swallow function, but will need assist with feeding due to physical deficits. Continue regular diet and thin liquids. No SLP f/u needed.       Aspiration Risk       Diet Recommendation Regular;Thin liquid   Liquid Administration via: Cup;Straw Medication Administration: Whole meds with liquid Supervision: Staff to assist with self feeding Compensations: Slow rate;Small sips/bites Postural Changes: Seated upright at 90 degrees    Other  Recommendations Oral Care Recommendations: Oral care BID   Follow up Recommendations None      Frequency and Duration            Prognosis        Swallow Study   General HPI: 76 y/o from [facility]  with dementia, frequent falls, hypothyroidism, HTN, very hard of hearing with cochlear implant who presents to the ER after a fall while getting out of her wheelchair. Found to have comminuted and impacted fracture of the surgical neck of the right humerus, acute fractures right radial neck and proximal ulnar, acute comminuted fractures of theintertrochanteric and subtrochanteric left femur.  Underwent surgery on 02/18/17.  Head CT negative.   Type of Study: Bedside Swallow Evaluation Previous Swallow Assessment: noen in chart Diet Prior to this Study: Regular;Thin liquids Temperature Spikes Noted: No Respiratory Status: Room air History of Recent Intubation: No Behavior/Cognition: Alert;Cooperative (hard of hearing/deaf) Oral Cavity Assessment: Within Functional Limits Oral Care Completed by SLP: No Oral Cavity - Dentition: Adequate natural dentition Vision: Functional for self-feeding Self-Feeding Abilities: Needs assist Patient Positioning: Upright in bed Baseline Vocal Quality: Normal Volitional Cough: Cognitively unable to elicit Volitional Swallow: Unable to elicit    Oral/Motor/Sensory Function Overall Oral Motor/Sensory Function: Within functional limits   Ice Chips Ice chips: Not tested   Thin Liquid Thin Liquid: Within functional limits Presentation: Cup;Straw    Nectar Thick Nectar Thick Liquid: Not tested   Honey Thick Honey Thick Liquid: Not tested   Puree Puree: Within functional limits   Solid   GO   Solid: Within functional limits       [facility], [place] CCC-SLP [phone]  Sheena [name] 02/22/2017,9:46 AM

## 2017-02-22 NOTE — Consult Note (Signed)
[facility] Face-to-Face Psychiatry Consult   Reason for Consult:  Excessive sedation and psych medication management Referring Physician:  Dr. Carolin Sicks Patient Identification: Sheena [name] MRN:  [id number] Principal Diagnosis: Closed intertrochanteric fracture of left femur [facility]) Diagnosis:   Patient Active Problem List   Diagnosis Date Noted  . Thrombocytopenia ([place]) [D69.6]   . Acute metabolic encephalopathy [Q76.22]   . Hip fracture ([place]) [S72.009A] 02/18/2017  . Closed intertrochanteric fracture of left femur ([place]) [S72.142A] 02/18/2017  . Closed fracture of right proximal radius and ulna [S52.001A, S52.101A] 02/18/2017  . Closed fracture of proximal end of right humerus [S42.201A] 02/18/2017  . Fall [W19.XXXA] 09/25/2016  . Acute encephalopathy [G93.40] 09/25/2016  . Wrist fracture, closed, right, initial encounter [S62.101A] 09/25/2016  . Hypoglycemia [E16.2] 09/25/2016  . Normocytic anemia [D64.9] 09/25/2016  . Abdominal pain [R10.9] 09/25/2016  . Closed fracture of right wrist [S62.101A]   . Psychosis [F29] 12/22/2012  . High anion gap metabolic acidosis [Q33.3] 12/17/2012  . DKA (diabetic ketoacidoses) ([place]) [E13.10] 12/17/2012  . Altered mental status [R41.82] 12/17/2012  . 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 H/O diarrhea [Z87.898] 12/17/2012  . Dementia [F03.90] 12/17/2012  . Hypothyroidism [E03.9] 12/17/2012  . Diabetes mellitus, type 2 ([place]) [E11.9] 12/17/2012  . Hypokalemia [E87.6] 12/17/2012  . Dehydration [E86.0] 12/17/2012  . Chronic pain [G89.29] 12/17/2012    Total Time spent with patient: 45 minutes  Subjective:   Sheena [name] is a 76 y.o. female patient admitted with dementia with recent fall from [facility].  HPI: Sheena [name] is a 76 y.o. female, resident of nursing home with medical history significant for- dementia, prior falls, hypothyroidism, hypertension. Patient seen face-to-face for this evaluation and patient  has extremely hard of hearing, hearing aids missing so unable to make conversation with the patient. Information for this evaluation obtained from the available medical records and case discussed with the hospitalist.Patient has been excessively sedated from time to time but during my evaluation patient what appeared sitting in a chair and not sleeping. Review of medication indicated patient has been taking Depakote sprinkles 125 mg twice daily, Aricept 10 mg at bedtime, Synthroid 75 g daily and Remeron 7.5 mg at bedtime and Seroquel 50 mg at bedtime  Past Psychiatric History: dementia and extremely hard hearing.   Risk to Self:   Risk to Others:   Prior Inpatient Therapy:   Prior Outpatient Therapy:    Past Medical History:  Past Medical History:  Diagnosis Date  . Dementia   . Diabetes mellitus   . Hypertension     Past Surgical History:  Procedure Laterality Date  . BACK SURGERY    . FEMUR IM NAIL Left 02/18/2017   Procedure: INTRAMEDULLARY (IM) NAIL FEMORAL;  Surgeon: Renette Butters, MD;  Location: [facility];  Service: Orthopedics;  Laterality: Left;  . INNER EAR SURGERY    . NOSE SURGERY     Family History:  Family History  Problem Relation Age of Onset  . Dementia Mother    Family Psychiatric  History: unknown Social History:  History  Alcohol Use No     History  Drug Use No    Social History   Social History  . Marital status: Widowed    Spouse name: N/A  . Number of children: N/A  . Years of education: N/A   Social History Main Topics  . Smoking status: Never Smoker  . Smokeless tobacco: Never Used  . Alcohol use No  .  Drug use: No  . Sexual activity: Not Currently   Other Topics Concern  . None   Social History Narrative  . None   Additional Social History:    Allergies:   Allergies  Allergen Reactions  . Bactrim Nausea And Vomiting    Labs:  Results for orders placed or performed during the hospital encounter of 02/17/17 (from the past 48  hour(s))  Glucose, capillary     Status: Abnormal   Collection Time: 02/20/17 12:14 PM  Result Value Ref Range   Glucose-Capillary 164 (H) 65 - 99 mg/dL  TSH     Status: Abnormal   Collection Time: 02/20/17  1:30 PM  Result Value Ref Range   TSH 50.012 (H) 0.350 - 4.500 uIU/mL    Comment: Performed by a 3rd Generation assay with a functional sensitivity of <=0.01 uIU/mL.  Glucose, capillary     Status: Abnormal   Collection Time: 02/20/17  4:00 PM  Result Value Ref Range   Glucose-Capillary 139 (H) 65 - 99 mg/dL  Glucose, capillary     Status: Abnormal   Collection Time: 02/20/17  8:18 PM  Result Value Ref Range   Glucose-Capillary 125 (H) 65 - 99 mg/dL  Glucose, capillary     Status: Abnormal   Collection Time: 02/21/17 12:24 AM  Result Value Ref Range   Glucose-Capillary 143 (H) 65 - 99 mg/dL  CBC     Status: Abnormal   Collection Time: 02/21/17  4:01 AM  Result Value Ref Range   WBC 7.6 4.0 - 10.5 K/uL   RBC 3.16 (L) 3.87 - 5.11 MIL/uL   Hemoglobin 9.1 (L) 12.0 - 15.0 g/dL   HCT 27.8 (L) 36.0 - 46.0 %   MCV 88.0 78.0 - 100.0 fL   MCH 28.8 26.0 - 34.0 pg   MCHC 32.7 30.0 - 36.0 g/dL   RDW 15.6 (H) 11.5 - 15.5 %   Platelets 112 (L) 150 - 400 K/uL    Comment: CONSISTENT WITH PREVIOUS RESULT  Basic metabolic panel     Status: Abnormal   Collection Time: 02/21/17  4:01 AM  Result Value Ref Range   Sodium 139 135 - 145 mmol/L   Potassium 3.7 3.5 - 5.1 mmol/L   Chloride 105 101 - 111 mmol/L   CO2 25 22 - 32 mmol/L   Glucose, Bld 161 (H) 65 - 99 mg/dL   BUN 20 6 - 20 mg/dL   Creatinine, Ser 1.11 (H) 0.44 - 1.00 mg/dL   Calcium 8.0 (L) 8.9 - 10.3 mg/dL   GFR calc non Af Amer 47 (L) >60 mL/min   GFR calc Af Amer 54 (L) >60 mL/min    Comment: (NOTE) The eGFR has been calculated using the CKD EPI equation. This calculation has not been validated in all clinical situations. eGFR's persistently <60 mL/min signify possible Chronic Kidney Disease.    Anion gap 9 5 - 15   Glucose, capillary     Status: Abnormal   Collection Time: 02/21/17  5:30 AM  Result Value Ref Range   Glucose-Capillary 195 (H) 65 - 99 mg/dL  Glucose, capillary     Status: Abnormal   Collection Time: 02/21/17  7:54 AM  Result Value Ref Range   Glucose-Capillary 189 (H) 65 - 99 mg/dL  Glucose, capillary     Status: Abnormal   Collection Time: 02/21/17 11:50 AM  Result Value Ref Range   Glucose-Capillary 227 (H) 65 - 99 mg/dL  Glucose, capillary  Status: Abnormal   Collection Time: 02/21/17  4:04 PM  Result Value Ref Range   Glucose-Capillary 172 (H) 65 - 99 mg/dL  Glucose, capillary     Status: Abnormal   Collection Time: 02/21/17  8:19 PM  Result Value Ref Range   Glucose-Capillary 208 (H) 65 - 99 mg/dL  Glucose, capillary     Status: Abnormal   Collection Time: 02/21/17 11:44 PM  Result Value Ref Range   Glucose-Capillary 170 (H) 65 - 99 mg/dL  CBC     Status: Abnormal   Collection Time: 02/22/17  3:06 AM  Result Value Ref Range   WBC 6.7 4.0 - 10.5 K/uL   RBC 3.34 (L) 3.87 - 5.11 MIL/uL   Hemoglobin 9.7 (L) 12.0 - 15.0 g/dL   HCT 29.2 (L) 36.0 - 46.0 %   MCV 87.4 78.0 - 100.0 fL   MCH 29.0 26.0 - 34.0 pg   MCHC 33.2 30.0 - 36.0 g/dL   RDW 15.0 11.5 - 15.5 %   Platelets 141 (L) 150 - 400 K/uL  Glucose, capillary     Status: Abnormal   Collection Time: 02/22/17  6:05 AM  Result Value Ref Range   Glucose-Capillary 167 (H) 65 - 99 mg/dL    Current Facility-Administered Medications  Medication Dose Route Frequency Provider Last Rate Last Dose  . 0.9 %  sodium chloride infusion   Intravenous Continuous Rosita Fire, MD 50 mL/hr at 02/22/17 0353    . acetaminophen (TYLENOL) tablet 650 mg  650 mg Oral Q4H PRN Debbe Odea, MD   650 mg at 02/22/17 0334  . divalproex (DEPAKOTE SPRINKLE) capsule 125 mg  125 mg Oral Q12H Blenda Nicely, RPH   125 mg at 02/22/17 0160  . donepezil (ARICEPT) tablet 10 mg  10 mg Oral QHS Debbe Odea, MD   10 mg at 02/21/17 2211   . feeding supplement (GLUCERNA SHAKE) (GLUCERNA SHAKE) liquid 237 mL  237 mL Oral BID BM Debbe Odea, MD   237 mL at 02/21/17 0955  . insulin aspart (novoLOG) injection 0-9 Units  0-9 Units Subcutaneous TID WC Debbe Odea, MD   2 Units at 02/22/17 0714  . insulin aspart (novoLOG) injection 5 Units  5 Units Intravenous Once Emokpae, Ejiroghene E, MD      . insulin glargine (LANTUS) injection 15 Units  15 Units Subcutaneous Daily Rosita Fire, MD   15 Units at 02/22/17 0950  . labetalol (NORMODYNE,TRANDATE) injection 10 mg  10 mg Intravenous Q2H PRN Emokpae, Ejiroghene E, MD   10 mg at 02/20/17 1621  . levothyroxine (SYNTHROID, LEVOTHROID) injection 75 mcg  75 mcg Intravenous Daily Rosita Fire, MD   75 mcg at 02/21/17 0940  . magnesium oxide (MAG-OX) tablet 200 mg  200 mg Oral Daily Debbe Odea, MD   200 mg at 02/22/17 0948  . mirtazapine (REMERON) tablet 7.5 mg  7.5 mg Oral QHS Debbe Odea, MD   7.5 mg at 02/21/17 2211  . multivitamin with minerals tablet 1 tablet  1 tablet Oral QPM Debbe Odea, MD   1 tablet at 02/21/17 1717  . ondansetron (ZOFRAN) tablet 4 mg  4 mg Oral Q6H PRN Emokpae, Ejiroghene E, MD       Or  . ondansetron (ZOFRAN) injection 4 mg  4 mg Intravenous Q6H PRN Emokpae, Ejiroghene E, MD      . potassium chloride (K-DUR,KLOR-CON) CR tablet 10 mEq  10 mEq Oral BID Debbe Odea, MD   10 mEq  at 02/22/17 0948  . QUEtiapine (SEROQUEL) tablet 50 mg  50 mg Oral QHS Debbe Odea, MD   50 mg at 02/21/17 2213  . senna-docusate (Senokot-S) tablet 1 tablet  1 tablet Oral QHS PRN Emokpae, Ejiroghene E, MD      . vitamin B-12 (CYANOCOBALAMIN) tablet 1,000 mcg  1,000 mcg Oral QPM Debbe Odea, MD   1,000 mcg at 02/21/17 1717  . vitamin C (ASCORBIC ACID) tablet 500 mg  500 mg Oral QPM Debbe Odea, MD   500 mg at 02/21/17 1717    Musculoskeletal: Strength & Muscle Tone: decreased Gait & Station: unable to stand Patient leans: N/A  Psychiatric Specialty  Exam: Physical Exam as per history and physical  ROS unable to obtain during this evaluation  Blood pressure (!) 162/67, pulse 84, temperature 98.1 F (36.7 C), temperature source Oral, resp. rate 16, SpO2 93 %.There is no height or weight on file to calculate BMI.  General Appearance: Guarded  Eye Contact:  Fair  Speech:  Blocked  Volume:  Decreased  Mood:  Depressed  Affect:  Constricted  Thought Process:  NA  Orientation:  NA  Thought Content:  NA  Suicidal Thoughts:  No  Homicidal Thoughts:  No  Memory:  Immediate;   Poor Recent;   NA  Judgement:  NA  Insight:  NA  Psychomotor Activity:  Decreased  Concentration:  Concentration: NA and Attention Span: NA  Recall:  NA  Fund of Knowledge:  NA  Language:  NA  Akathisia:  Negative  Handed:  Right  AIMS (if indicated):     Assets:  Others:  TBD  ADL's:  Impaired  Cognition:  Impaired,  Severe  Sleep:        Treatment Plan Summary: 76 years old female with dementia admitted with recent fall. Reportedly patient has been excessively sedated and requested psychiatric consultation to adjust her medication. Review of medication indicated patient has been taking Depakote sprinkles 125 mg twice daily, Aricept 10 mg at bedtime, Synthroid 75 g daily and Remeron 7.5 mg at bedtime and Seroquel 50 mg at bedtime  Will recommend discontinue Remeron as it is excessively sedating along with seroquel, patient EKG showed normal levels of QT and QTC.  Appreciate psychiatric consultation and we sign off as of today Please contact [phone] or [phone] if needs further assistance   Disposition: Patient will be better treated at nursing home facility and medically stable. Supportive therapy provided about ongoing stressors.  Ambrose Finland, MD 02/22/2017 11:45 AM

## 2017-02-22 NOTE — Care Management Important Message (Signed)
Important Message  Patient Details  Name: VIELKA KLINEDINST MRN: [id number] Date of Birth: July 04, 1940   Medicare Important Message Given:  Yes    Trenton Passow Stefan Church 02/22/2017, 12:43 PM

## 2017-02-22 NOTE — Plan of Care (Signed)
Problem: Education: Goal: Knowledge of [facility] Education information/materials will improve Outcome: Progressing POC reviewed with pt.; pt. confused.   

## 2017-02-22 NOTE — Progress Notes (Signed)
Sheena [name], Sheena DOA: 02/17/2017 PCP: Georgann Housekeeper, Sheena [name]   Brief Narrative: 76 y/o from [facility] with dementia, frequent falls, hypothyroidism, HTN, very hard of hearing who presents to the ER after a fall while getting out of her wheelchair.  Found to have  - comminuted and impacted fracture of the surgical neck of the right humerus - acute fractures right radial neck and proximal ulnar - acute comminuted fractures of the intertrochanteric and subtrochanteric left femur.  Assessment & Plan:   # Closed intertrochanteric fracture of left femur: Status post intermedullary nailing by orthopedics on September 10. Patient was lethargic this morning and therefore discontinued pain medication. -PT OT evaluation and supportive care. -Follow-up orthopedic's plan.  #  Closed fracture of right proximal radius and ulna     Closed fracture of proximal end of right humerus - nonoperative management at this time. - Sling for comfort. - Nonweightbearing right upper extremity  #Acute encephalopathy likely a metabolic versus toxic:  Patient is more alert awake today and able to eat about 30% of the breakfast as per nursing tech. She has severe hearing impairment. She is on multiple medications including Seroquel, Remeron, Depakote. I consulted psychiatrist to evaluate the medications and I discussed with psych team. Also discussed with the swallow evaluation nurse. On thin liquid diet. Continue to monitor. Off narcotics. CT head with no acute finding. Patient may have slow recovery from anesthesia.  #Acute blood loss anemia: Received 2 units of red blood cell. Hemoglobin 9.7. No sign of active bleeding. Monitor lab.  #Type 2 diabetes: Monitor blood sugar level. I lowered the dose of Lantus to 15 units because patient is not eating much.  #Acute kidney injury: Serum creatinine level improved.  #History of dementia without behavioral issue: Continue  Aricept and Depakote when able to take orally. Monitor mental status  #Hypothyroidism: TSH level significantly elevated. He therefore currently on iron dose of IV Synthroid. Recommended to check thyroid function test in 4-6 weeks.  #Thrombocytopenia: Platelet count 141 today. I'll discontinue Lovenox and monitor CBC.   DVT prophylaxis: Discontinue Lovenox. Order SCD. Code Status: DO NOT RESUSCITATE Family Communication: Discussed with the patient's son over the phone on 9/13 Disposition Plan: Currently admitted    Consultants:   Orthopedics  Procedures: Orthopedic SURGERY Antimicrobials: None  Subjective: Seen and examined at bedside. She is more alert awake but not following commands. Patient has hard of hearing.  Objective: Vitals:   02/21/17 0526 02/21/17 1510 02/21/17 1900 02/22/17 0300  BP: (!) 159/62 (!) 171/66 (!) 156/64 (!) 162/67  Pulse: 82 83 90 84  Resp: Temp: 98.3 F (36.8 C) 98.3 F (36.8 C) 98.1 F (36.7 C) 98.1 F (36.7 C)  TempSrc: Axillary Axillary Oral Oral  SpO2: 98% 95% 91% 93%    Intake/Output Summary (Last 24 hours) at 02/22/17 1033 Last data filed at 02/22/17 0653  Gross per 24 hour  Intake              820 ml  Output             1300 ml  Net             -480 ml   There were no vitals filed for this visit.  Examination:  General exam: Elderly female lying in bed comfortable, more alert and awake today.  Respirator: clear bilateral Respiratory effort normal. No wheezing or crackle Cardiovascular system:regular rate  and rhythm, S1-S2 normal.pedal edema. Gastrointestinal system: Abdomen is nondistended, soft and nontender. Normal bowel sounds heard. Central nervous system:  More alert. Skin: No rashes, lesions or ulcers Psychiatry: Unable to assess    Data Reviewed: I have personally reviewed following labs and imaging studies  CBC:  Recent Labs Lab 02/17/17 2207 02/18/17 0302 02/19/17 0534 02/20/17 0409  02/21/17 0401 02/22/17 0306  WBC 13.5* 13.3* 10.4 8.7 7.6 6.7  NEUTROABS 11.1*  --   --   --   --   --   HGB 9.8* 8.2* 6.0* 9.6* 9.1* 9.7*  HCT 31.4* 26.5* 20.2* 29.3* 27.8* 29.2*  MCV 89.0 89.2 91.4 85.9 88.0 87.4  PLT 241 276 232 117* 112* 141*   Basic Metabolic Panel:  Recent Labs Lab 02/17/17 2207 02/18/17 0302 02/19/17 0534 02/20/17 0409 02/21/17 0401  NA 135 138 141 140 139  K 4.6 4.6 4.2 3.9 3.7  CL 100* 105 109 108 105  CO2 22 21* GLUCOSE 508* 481* 242* 220* 161*  BUN 27* [name]* 31* 21* 20  CREATININE 1.46* 1.51* 1.45* 1.15* 1.11*  CALCIUM 8.5* 8.2* 7.8* 7.7* 8.0*   GFR: CrCl cannot be calculated (Unknown ideal weight.). Liver Function Tests:  Recent Labs Lab 02/17/17 2207  AST 22  ALT 19  ALKPHOS 127*  BILITOT 1.1  PROT 6.0*  ALBUMIN 3.3*   No results for input(s): LIPASE, AMYLASE in the last 168 hours. No results for input(s): AMMONIA in the last 168 hours. Coagulation Profile: No results for input(s): INR, PROTIME in the last 168 hours. Cardiac Enzymes: No results for input(s): CKTOTAL, CKMB, CKMBINDEX, TROPONINI in the last 168 hours. BNP (last 3 results) No results for input(s): PROBNP in the last 8760 hours. HbA1C: No results for input(s): HGBA1C in the last 72 hours. CBG:  Recent Labs Lab 02/21/17 1150 02/21/17 1604 02/21/17 2019 02/21/17 2344 02/22/17 0605  GLUCAP 227* 172* 208* 170* 167*   Lipid Profile: No results for input(s): CHOL, HDL, LDLCALC, TRIG, CHOLHDL, LDLDIRECT in the last 72 hours. Thyroid Function Tests:  Recent Labs  02/20/17 1330  TSH 50.012*   Anemia Panel:  Recent Labs  02/19/17 2006  VITAMINB12 1,308*  FOLATE 23.2  FERRITIN 25  TIBC 420  IRON 354*  RETICCTPCT 3.4*   Sepsis Labs: No results for input(s): PROCALCITON, LATICACIDVEN in the last 168 hours.  Recent Results (from the past 240 hour(s))  Surgical pcr screen     Status: None   Collection Time: 02/18/17  3:34 AM  Result Value Ref  Range Status   MRSA, PCR NEGATIVE NEGATIVE Final   Staphylococcus aureus NEGATIVE NEGATIVE Final    Comment: (NOTE) The Xpert SA Assay (FDA approved for NASAL specimens in patients 64 years of age and older), is one component of a comprehensive surveillance program. It is not intended to diagnose infection nor to guide or monitor treatment.          Radiology Studies: Ct Head Wo Contrast  Result Date: 02/20/2017 CLINICAL DATA:  Altered level of consciousness.  History of dementia EXAM: CT HEAD WITHOUT CONTRAST TECHNIQUE: Contiguous axial images were obtained from the base of the skull through the vertex without intravenous contrast. COMPARISON:  February 17, 2017 FINDINGS: Brain: Moderate diffuse atrophy is stable. 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 With this limitation, there is no demonstrable mass, hemorrhage, extra-axial fluid collection, or midline shift. There is patchy small vessel disease in the centra semiovale  bilaterally. There is evidence of a prior small lacunar infarct in the anterior right lentiform nucleus, stable. Elsewhere gray-white compartments appear normal. No acute infarct is demonstrable on this study. Vascular: There is no hyperdense vessel. There are foci of bilateral carotid siphon arteriovascular calcification. Skull: Bony calvarium appears intact. Degenerative changes noted in the temporomandibular joints. Sinuses/Orbits: There is opacification in Areas of the posterior left ethmoid sinus. There is also mild opacification in the lateral left sphenoid sinus region. Other visualized paranasal sinuses are clear. Visualized orbits appear symmetric bilaterally. Other: Mastoids on the left are clear. There is opacification on the right in the area of the cochlear implant. No air-fluid level noted in the right sphenoid region. IMPRESSION: 1. 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 Stable opacification in portions the right  mastoid air cell region, likely of postoperative etiology. 2. Stable atrophy with periventricular small vessel disease. Prior small lacunar infarct in the anterior right lentiform nucleus. No evident acute infarct. No intracranial mass hemorrhage, or extra-axial fluid collection. 3.  Degenerative change in the temporomandibular joints. 4. Paranasal sinus disease, most notably in the posterior left ethmoid air cell region. 5.  Foci of arterial vascular calcification noted. Electronically Signed   By: Bretta Bang III M.D.   On: 02/20/2017 12:13        Scheduled Meds: . divalproex  125 mg Oral Q12H  . donepezil  10 mg Oral QHS  . feeding supplement (GLUCERNA SHAKE)  237 mL Oral BID BM  . insulin aspart  0-9 Units Subcutaneous TID WC  . insulin aspart  5 Units Intravenous Once  . insulin glargine  15 Units Subcutaneous Daily  . levothyroxine  75 mcg Intravenous Daily  . magnesium oxide  200 mg Oral Daily  . mirtazapine  7.5 mg Oral QHS  . multivitamin with minerals  1 tablet Oral QPM  . potassium chloride  10 mEq Oral BID  . QUEtiapine  50 mg Oral QHS  . vitamin B-12  1,000 mcg Oral QPM  . vitamin C  500 mg Oral QPM   Continuous Infusions: . sodium chloride 50 mL/hr at 02/22/17 0353     LOS: 4 days    Atzel Mccambridge Jaynie Collins, Sheena [name] [facility] Pager [phone]  If 7PM-7AM, please contact night-coverage [email or web address] Password [facility] 02/22/2017, 10:33 AM

## 2017-02-23 DIAGNOSIS — Z7409 Other reduced mobility: Secondary | ICD-10-CM | POA: Diagnosis not present

## 2017-02-23 DIAGNOSIS — S52125D Nondisplaced fracture of head of left radius, subsequent encounter for closed fracture with routine healing: Secondary | ICD-10-CM | POA: Diagnosis not present

## 2017-02-23 DIAGNOSIS — R488 Other symbolic dysfunctions: Secondary | ICD-10-CM | POA: Diagnosis not present

## 2017-02-23 DIAGNOSIS — M25552 Pain in left hip: Secondary | ICD-10-CM | POA: Diagnosis not present

## 2017-02-23 DIAGNOSIS — R1310 Dysphagia, unspecified: Secondary | ICD-10-CM | POA: Diagnosis not present

## 2017-02-23 DIAGNOSIS — S52025D Nondisplaced fracture of olecranon process without intraarticular extension of left ulna, subsequent encounter for closed fracture with routine healing: Secondary | ICD-10-CM | POA: Diagnosis not present

## 2017-02-23 DIAGNOSIS — S42295D Other nondisplaced fracture of upper end of left humerus, subsequent encounter for fracture with routine healing: Secondary | ICD-10-CM | POA: Diagnosis not present

## 2017-02-23 DIAGNOSIS — Z9181 History of falling: Secondary | ICD-10-CM | POA: Diagnosis not present

## 2017-02-23 DIAGNOSIS — S42309A Unspecified fracture of shaft of humerus, unspecified arm, initial encounter for closed fracture: Secondary | ICD-10-CM | POA: Diagnosis not present

## 2017-02-23 DIAGNOSIS — S72142D Displaced intertrochanteric fracture of left femur, subsequent encounter for closed fracture with routine healing: Secondary | ICD-10-CM | POA: Diagnosis not present

## 2017-02-23 DIAGNOSIS — R5381 Other malaise: Secondary | ICD-10-CM | POA: Diagnosis not present

## 2017-02-23 DIAGNOSIS — G9341 Metabolic encephalopathy: Secondary | ICD-10-CM | POA: Diagnosis not present

## 2017-02-23 DIAGNOSIS — D62 Acute posthemorrhagic anemia: Secondary | ICD-10-CM | POA: Diagnosis not present

## 2017-02-23 DIAGNOSIS — R4182 Altered mental status, unspecified: Secondary | ICD-10-CM | POA: Diagnosis not present

## 2017-02-23 DIAGNOSIS — M6281 Muscle weakness (generalized): Secondary | ICD-10-CM | POA: Diagnosis not present

## 2017-02-23 DIAGNOSIS — Z4789 Encounter for other orthopedic aftercare: Secondary | ICD-10-CM | POA: Diagnosis not present

## 2017-02-23 DIAGNOSIS — E039 Hypothyroidism, unspecified: Secondary | ICD-10-CM | POA: Diagnosis not present

## 2017-02-23 DIAGNOSIS — R2689 Other abnormalities of gait and mobility: Secondary | ICD-10-CM | POA: Diagnosis not present

## 2017-02-23 DIAGNOSIS — R2681 Unsteadiness on feet: Secondary | ICD-10-CM | POA: Diagnosis not present

## 2017-02-23 DIAGNOSIS — F0150 Vascular dementia without behavioral disturbance: Secondary | ICD-10-CM

## 2017-02-23 DIAGNOSIS — S62101D Fracture of unspecified carpal bone, right wrist, subsequent encounter for fracture with routine healing: Secondary | ICD-10-CM | POA: Diagnosis not present

## 2017-02-23 DIAGNOSIS — S72002A Fracture of unspecified part of neck of left femur, initial encounter for closed fracture: Secondary | ICD-10-CM | POA: Diagnosis not present

## 2017-02-23 DIAGNOSIS — F0390 Unspecified dementia without behavioral disturbance: Secondary | ICD-10-CM | POA: Diagnosis not present

## 2017-02-23 LAB — GLUCOSE, CAPILLARY
GLUCOSE-CAPILLARY: 266 mg/dL — AB (ref 65–99)
Glucose-Capillary: 244 mg/dL — ABNORMAL HIGH (ref 65–99)
Glucose-Capillary: 249 mg/dL — ABNORMAL HIGH (ref 65–99)

## 2017-02-23 MED ORDER — LEVOTHYROXINE SODIUM 125 MCG PO TABS: 125.0000 ug | ORAL_TABLET | Freq: Every day | ORAL | 0 refills | Status: AC

## 2017-02-23 NOTE — Discharge Summary (Addendum)
Physician Discharge Summary  Sheena [name]:[id number] DOB: 01/26/1941 DOA: 02/17/2017  PCP: Georgann Housekeeper, MD  Admit date: 02/17/2017 Discharge date: 02/23/2017  Admitted From:SNF Disposition:SNF  Recommendations for Outpatient Follow-up:  1. Follow up with PCP in 1-2 weeks 2. Please obtain BMP/CBC in one week  Home Health:SNF Equipment/Devices:none Discharge Condition:stable CODE STATUS:DNR Diet recommendation:carb modified heart healthy diet  Brief/Interim Summary: 76 y/o from [facility] with dementia, frequent falls, hypothyroidism, HTN, very hard of hearing who presents to the ER after a fall while getting out of her wheelchair.  Found to have  - comminuted and impacted fracture of the surgical neck of the right humerus - acute fractures right radial neck and proximal ulnar - acute comminuted fractures of theintertrochanteric and subtrochanteric left femur.  # Closed intertrochanteric fracture of left femur: Status post intermedullary nailing by orthopedics on September 10.  -Patient is clinically improved. Okay to discharge by orthopedics with outpatient follow-up. Lovenox for DVT prophylaxis.  # Closed fracture of right proximal radius and ulna   Closed fracture of proximal end of right humerus - nonoperative management at this time. - Sling for comfort. - Nonweightbearing right upper extremity  #Acute encephalopathy likely a metabolic versus toxic:  -Patient with improved mental status. She also has history of dementia. Evaluated by psychiatrist who discontinue Remeron. CT head with no acute finding. Continue to monitor. Encourage oral intake. Minimize narcotics.   #Acute blood loss anemia: Received 2 units of red blood cell. Hemoglobin 9.7. No sign of active bleeding.   #Type 2 diabetes: Continue Lantus and monitor blood sugar level.  #Acute kidney injury: Serum creatinine level improved.  #History of dementia without behavioral issue: Continue Aricept  and Depakote. Patient may benefit from outpatient palliative care evaluation.  #Hypothyroidism: TSH level significantly elevated. Treated with IV Synthroid and increase the dose of oral Synthroid to 125 g on discharge. Recommended to monitor thyroid function test in 4-6 weeks.  #Thrombocytopenia: Platelet count improved.  Patient is clinically stable. At this time is stable to transfer her care to outpatient. Discussed with Child psychotherapist.  Discharge Diagnoses:  Principal Problem:   Closed intertrochanteric fracture of left femur (HCC) Active Problems:   Dementia   Hypothyroidism   Diabetes mellitus, type 2 (HCC)   Fall   Hip fracture (HCC)   Closed fracture of right proximal radius and ulna   Closed fracture of proximal end of right humerus   Acute metabolic encephalopathy   Thrombocytopenia (HCC)    Discharge Instructions  Discharge Instructions    Call MD for:  difficulty breathing, headache or visual disturbances    Complete by:  As directed    Call MD for:  extreme fatigue    Complete by:  As directed    Call MD for:  hives    Complete by:  As directed    Call MD for:  persistant dizziness or light-headedness    Complete by:  As directed    Call MD for:  persistant nausea and vomiting    Complete by:  As directed    Call MD for:  severe uncontrolled pain    Complete by:  As directed    Call MD for:  temperature >100.4    Complete by:  As directed    Diet - low sodium heart healthy    Complete by:  As directed    Diet Carb Modified    Complete by:  As directed    Increase activity slowly  Complete by:  As directed      Allergies as of 02/23/2017      Reactions   Bactrim Nausea And Vomiting      Medication List    STOP taking these medications   ibuprofen 200 MG tablet Commonly known as:  ADVIL,MOTRIN   magnesium chloride 64 MG Tbec SR tablet Commonly known as:  SLOW-MAG   mirtazapine 7.5 MG tablet Commonly known as:  REMERON     TAKE these  medications   acetaminophen 325 MG tablet Commonly known as:  TYLENOL Take 650 mg by mouth every 4 (four) hours as needed (pain).   aspirin EC 81 MG tablet Take 81 mg by mouth daily.   Calcium-Vitamin D 600-200 MG-UNIT Caps Take 1 tablet by mouth daily.   cholecalciferol 1000 units tablet Commonly known as:  VITAMIN D Take 1,000 Units by mouth daily.   divalproex 125 MG capsule Commonly known as:  DEPAKOTE SPRINKLE Take 1 capsule (125 mg total) by mouth every 12 (twelve) hours.   donepezil 10 MG tablet Commonly known as:  ARICEPT Take 10 mg by mouth at bedtime.   enoxaparin 40 MG/0.4ML injection Commonly known as:  LOVENOX Inject 0.4 mLs (40 mg total) into the skin daily. For 30 days post op for DVT prophylaxis   fish oil-omega-3 fatty acids 1000 MG capsule Take 1 g by mouth daily.   HYDROcodone-acetaminophen 5-325 MG tablet Commonly known as:  NORCO Take 1 tablet by mouth every 6 (six) hours as needed for moderate pain.   insulin aspart 100 UNIT/ML injection Commonly known as:  novoLOG Inject 0-10 Units into the skin See admin instructions. Inject 0-10 units subcutaneously before meals and at bedtime per sliding scale: CBG 60-149 0 units, 150-250 5 units, 251-300 8 units, 301-350 10 units, >350 notify provider   insulin glargine 100 UNIT/ML injection Commonly known as:  LANTUS Inject 10 Units into the skin at bedtime.   levothyroxine 125 MCG tablet Commonly known as:  SYNTHROID Take 1 tablet (125 mcg total) by mouth daily. What changed:  medication strength  how much to take  when to take this   lovastatin 20 MG tablet Commonly known as:  MEVACOR Take 20 mg by mouth at bedtime.   magnesium oxide 400 MG tablet Commonly known as:  MAG-OX Take 200 mg by mouth daily.   multivitamin with minerals Tabs tablet Take 1 tablet by mouth every evening.   potassium chloride 10 MEQ tablet Commonly known as:  K-DUR,KLOR-CON Take 1 tablet (10 mEq total) by mouth 2  (two) times daily.   QUEtiapine 50 MG tablet Commonly known as:  SEROQUEL Take 1 tablet (50 mg total) by mouth at bedtime.   vitamin B-12 1000 MCG tablet Commonly known as:  CYANOCOBALAMIN Take 1,000 mcg by mouth every evening.   vitamin C 500 MG tablet Commonly known as:  ASCORBIC ACID Take 500 mg by mouth every evening.            Discharge Care Instructions        Start     Ordered   02/23/17 0000  levothyroxine (SYNTHROID) 125 MCG tablet  Daily     02/23/17 1046   02/23/17 0000  Increase activity slowly     02/23/17 1046   02/23/17 0000  Diet - low sodium heart healthy     02/23/17 1046   02/23/17 0000  Diet Carb Modified     02/23/17 1046   02/23/17 0000  Call MD for:  temperature >100.4     02/23/17 1046   02/23/17 0000  Call MD for:  persistant nausea and vomiting     02/23/17 1046   02/23/17 0000  Call MD for:  severe uncontrolled pain     02/23/17 1046   02/23/17 0000  Call MD for:  difficulty breathing, headache or visual disturbances     02/23/17 1046   02/23/17 0000  Call MD for:  hives     02/23/17 1046   02/23/17 0000  Call MD for:  persistant dizziness or light-headedness     02/23/17 1046   02/23/17 0000  Call MD for:  extreme fatigue     02/23/17 1046   02/20/17 0000  enoxaparin (LOVENOX) 40 MG/0.4ML injection  Every 24 hours     02/20/17 0746   02/20/17 0000  HYDROcodone-acetaminophen (NORCO) 5-325 MG tablet  Every 6 hours PRN     02/20/17 0746     Follow-up Information    Georgann Housekeeper, MD. Schedule an appointment as soon as possible for a visit in 1 week(s).   Specialty:  Internal Medicine Contact information: [address] [place] [address] [phone]        Sheral Apley, MD. Schedule an appointment as soon as possible for a visit in 2 week(s).   Specialty:  Orthopedic Surgery Contact information: [address] [place] [address] [phone]          Allergies  Allergen  Reactions  . Bactrim Nausea And Vomiting    Consultations: Orthopedics  Procedures/Studies: Orthopedic surgery  Subjective: Seen and examined at bedside. She was more alert awake. Hard of hearing.  Discharge Exam: Vitals:   02/22/17 2046 02/23/17 0505  BP: (!) 160/89 (!) 150/59  Pulse: 99 80  Resp: 20 15  Temp: 97.9 F (36.6 C) 98.6 F (37 C)  SpO2: 99% 93%   Vitals:   02/22/17 0300 02/22/17 1600 02/22/17 2046 02/23/17 0505  BP: (!) 162/67 (!) 152/63 (!) 160/89 (!) 150/59  Pulse: 84 72 99 80  Resp: Temp: 98.1 F (36.7 C) 98 F (36.7 C) 97.9 F (36.6 C) 98.6 F (37 C)  TempSrc: Oral Oral Oral Axillary  SpO2: 93% 95% 99% 93%    General: Pt is alert, awake, not in acute distress Cardiovascular: RRR, S1/S2 +, no rubs, no gallops Respiratory: CTA bilaterally, no wheezing, no rhonchi Abdominal: Soft, NT, ND, bowel sounds + Extremities: no edema, no cyanosis. RUE sling    The results of significant diagnostics from this hospitalization (including imaging, microbiology, ancillary and laboratory) are listed below for reference.     Microbiology: Recent Results (from the past 240 hour(s))  Surgical pcr screen     Status: None   Collection Time: 02/18/17  3:34 AM  Result Value Ref Range Status   MRSA, PCR NEGATIVE NEGATIVE Final   Staphylococcus aureus NEGATIVE NEGATIVE Final    Comment: (NOTE) The Xpert SA Assay (FDA approved for NASAL specimens in patients 66 years of age and older), is one component of a comprehensive surveillance program. It is not intended to diagnose infection nor to guide or monitor treatment.      Labs: BNP (last 3 results) No results for input(s): BNP in the last 8760 hours. Basic Metabolic Panel:  Recent Labs Lab 02/17/17 2207 02/18/17 0302 02/19/17 0534 02/20/17 0409 02/21/17 0401  NA 135 138 141 140 139  K 4.6 4.6 4.2 3.9 3.7  CL  100* 105 109 108 105  CO2 22 21* GLUCOSE 508* 481* 242* 220* 161*   BUN 27* 28* 31* 21* 20  CREATININE 1.46* 1.51* 1.45* 1.15* 1.11*  CALCIUM 8.5* 8.2* 7.8* 7.7* 8.0*   Liver Function Tests:  Recent Labs Lab 02/17/17 2207  AST 22  ALT 19  ALKPHOS 127*  BILITOT 1.1  PROT 6.0*  ALBUMIN 3.3*   No results for input(s): LIPASE, AMYLASE in the last 168 hours. No results for input(s): AMMONIA in the last 168 hours. CBC:  Recent Labs Lab 02/17/17 2207 02/18/17 0302 02/19/17 0534 02/20/17 0409 02/21/17 0401 02/22/17 0306  WBC 13.5* 13.3* 10.4 8.7 7.6 6.7  NEUTROABS 11.1*  --   --   --   --   --   HGB 9.8* 8.2* 6.0* 9.6* 9.1* 9.7*  HCT 31.4* 26.5* 20.2* 29.3* 27.8* 29.2*  MCV 89.0 89.2 91.4 85.9 88.0 87.4  PLT 241 276 232 117* 112* 141*   Cardiac Enzymes: No results for input(s): CKTOTAL, CKMB, CKMBINDEX, TROPONINI in the last 168 hours. BNP: Invalid input(s): POCBNP CBG:  Recent Labs Lab 02/22/17 1627 02/22/17 2124 02/23/17 0000 02/23/17 0503 02/23/17 0831  GLUCAP 279* 257* 244* 249* 266*   D-Dimer No results for input(s): DDIMER in the last 72 hours. Hgb A1c No results for input(s): HGBA1C in the last 72 hours. Lipid Profile No results for input(s): CHOL, HDL, LDLCALC, TRIG, CHOLHDL, LDLDIRECT in the last 72 hours. Thyroid function studies  Recent Labs  02/20/17 1330  TSH 50.012*   Anemia work up No results for input(s): VITAMINB12, FOLATE, FERRITIN, TIBC, IRON, RETICCTPCT in the last 72 hours. Urinalysis    Component Value Date/Time   COLORURINE YELLOW 02/18/2017 0530   APPEARANCEUR HAZY (A) 02/18/2017 0530   LABSPEC 1.027 02/18/2017 0530   PHURINE 6.0 02/18/2017 0530   GLUCOSEU >=500 (A) 02/18/2017 0530   HGBUR NEGATIVE 02/18/2017 0530   BILIRUBINUR NEGATIVE 02/18/2017 0530   KETONESUR 20 (A) 02/18/2017 0530   PROTEINUR NEGATIVE 02/18/2017 0530   UROBILINOGEN 1.0 12/16/2012 1937   NITRITE POSITIVE (A) 02/18/2017 0530   LEUKOCYTESUR NEGATIVE 02/18/2017 0530   Sepsis Labs Invalid input(s): PROCALCITONIN,   WBC,  LACTICIDVEN Microbiology Recent Results (from the past 240 hour(s))  Surgical pcr screen     Status: None   Collection Time: 02/18/17  3:34 AM  Result Value Ref Range Status   MRSA, PCR NEGATIVE NEGATIVE Final   Staphylococcus aureus NEGATIVE NEGATIVE Final    Comment: (NOTE) The Xpert SA Assay (FDA approved for NASAL specimens in patients 20 years of age and older), is one component of a comprehensive surveillance program. It is not intended to diagnose infection nor to guide or monitor treatment.      Time coordinating discharge: 30 minutes  SIGNED:   Maxie Barb, MD  [facility] 02/23/2017, 10:46 AM  If 7PM-7AM, please contact night-coverage [email or web address] Password [email or web address]

## 2017-02-23 NOTE — Progress Notes (Signed)
Facility, [facility] not picking up to receive report on Pt transferring back to them today. RN left name and phone number to contact me when facility is able to receive report.

## 2017-02-23 NOTE — Clinical Social Work Note (Signed)
Clinical Social Worker facilitated patient discharge including contacting patient family and facility to confirm patient discharge plans.  Clinical information faxed to facility and family agreeable with plan.  CSW arranged ambulance transport via [facility] to [facility].  RN to call report prior to discharge.  Clinical Social Worker will sign off for now as social work intervention is no longer needed. Please consult [place] again if new need arises.  Edwin Dada, MSW, LCSW-A Weekend Clinical Social Worker [phone]

## 2017-02-23 NOTE — Plan of Care (Signed)
Problem: Education: Goal: Knowledge of [facility] Education information/materials will improve Outcome: Not Met (add Reason) dementia  Problem: Safety: Goal: Ability to remain free from injury will improve Outcome: Not Met (add Reason) dememtia  Problem: Health Behavior/Discharge Planning: Goal: Ability to manage health-related needs will improve Outcome: Not Met (add Reason) dememtia  Problem: Pain Managment: Goal: General experience of comfort will improve Outcome: Not Met (add Reason) dememtia  Problem: Physical Regulation: Goal: Ability to maintain clinical measurements within normal limits will improve Outcome: Not Met (add Reason) dememtia Goal: Will remain free from infection Outcome: Not Met (add Reason) dememtia  Problem: Skin Integrity: Goal: Risk for impaired skin integrity will decrease Outcome: Not Met (add Reason) dememtia  Problem: Tissue Perfusion: Goal: Risk factors for ineffective tissue perfusion will decrease Outcome: Not Met (add Reason) dememtia  Problem: Activity: Goal: Risk for activity intolerance will decrease Outcome: Not Met (add Reason) dememtia  Problem: Fluid Volume: Goal: Ability to maintain a balanced intake and output will improve Outcome: Not Met (add Reason) dememtia  Problem: Nutrition: Goal: Adequate nutrition will be maintained Outcome: Not Met (add Reason) dememtia  Problem: Bowel/Gastric: Goal: Will not experience complications related to bowel motility Outcome: Not Met (add Reason) dememtia  Problem: Activity: Goal: Ability to ambulate and perform ADLs will improve Outcome: Not Met (add Reason) dememtia  Problem: Education: Goal: Verbalization of understanding the information provided (i.e., activity precautions, restrictions, etc) will improve Outcome: Not Met (add Reason) dememtia  Problem: Health Behavior/Discharge Planning: Goal: Identification of resources available to assist in meeting health  care needs will improve Outcome: Not Met (add Reason) dememtia  Problem: Nutrition: Goal: Ability to attain and maintain optimal nutritional status will improve Outcome: Not Met (add Reason) dememtia  Problem: Physical Regulation: Goal: Will remain free from infection Outcome: Not Met (add Reason) dememtia Goal: Postoperative complications will be avoided or minimized Outcome: Not Met (add Reason) dememtia Goal: Diagnostic test results will improve Outcome: Not Met (add Reason) dememtia  Problem: Self-Concept: Goal: Ability to maintain and perform role responsibilities to the fullest extent possible will improve Outcome: Not Met (add Reason) dememtia Goal: Verbalizations of decreased anxiety will increase Outcome: Not Met (add Reason) dememtia  Problem: Pain Management: Goal: Pain level will decrease Outcome: Not Met (add Reason) dememtia  Problem: Education: Goal: Knowledge of  General Education information/materials will improve Outcome: Not Met (add Reason) dememtia

## 2017-02-23 NOTE — Clinical Social Work Placement (Signed)
   CLINICAL SOCIAL WORK PLACEMENT  NOTE  Date:  02/23/2017  Patient Details  Name: Sheena [name] MRN: [id number] Date of Birth: [birth year]  Clinical Social Work is seeking post-discharge placement for this patient at the Skilled  Nursing Facility level of care (*CSW will initial, date and re-position this form in  chart as items are completed):  Yes   Patient/family provided with [facility] Clinical Social Work Department's list of facilities offering this level of care within the geographic area requested by the patient (or if unable, by the patient's family).  Yes   Patient/family informed of their freedom to choose among providers that offer the needed level of care, that participate in Medicare, Medicaid or managed care program needed by the patient, have an available bed and are willing to accept the patient.  Yes   Patient/family informed of [facility]'s ownership interest in [facility] and [facility], as well as of the fact that they are under no obligation to receive care at these facilities.  PASRR submitted to EDS on       PASRR number received on 02/20/17     Existing PASRR number confirmed on       FL2 transmitted to all facilities in geographic area requested by pt/family on       FL2 transmitted to all facilities within larger geographic area on 02/20/17     Patient informed that his/her managed care company has contracts with or will negotiate with certain facilities, including the following:        Yes   Patient/family informed of bed offers received.  Patient chooses bed at [facility]     Physician recommends and patient chooses bed at      Patient to be transferred to [facility] on 02/23/17.  Patient to be transferred to facility by [facility]     Patient family notified on 02/22/17 of transfer.  Name of family member notified:  son, Onalee Hua     PHYSICIAN Please prepare prescriptions     Additional Comment:     _______________________________________________ Inis Sizer, LCSW 02/23/2017, 11:18 AM

## 2017-02-26 DIAGNOSIS — Z9181 History of falling: Secondary | ICD-10-CM | POA: Diagnosis not present

## 2017-02-26 DIAGNOSIS — F0390 Unspecified dementia without behavioral disturbance: Secondary | ICD-10-CM | POA: Diagnosis not present

## 2017-02-26 DIAGNOSIS — M25552 Pain in left hip: Secondary | ICD-10-CM | POA: Diagnosis not present

## 2017-02-28 DIAGNOSIS — D62 Acute posthemorrhagic anemia: Secondary | ICD-10-CM | POA: Diagnosis not present

## 2017-02-28 DIAGNOSIS — R1310 Dysphagia, unspecified: Secondary | ICD-10-CM | POA: Diagnosis not present

## 2017-02-28 DIAGNOSIS — E039 Hypothyroidism, unspecified: Secondary | ICD-10-CM | POA: Diagnosis not present

## 2017-02-28 DIAGNOSIS — S72002A Fracture of unspecified part of neck of left femur, initial encounter for closed fracture: Secondary | ICD-10-CM | POA: Diagnosis not present

## 2017-03-04 DIAGNOSIS — F0390 Unspecified dementia without behavioral disturbance: Secondary | ICD-10-CM | POA: Diagnosis not present

## 2017-03-04 DIAGNOSIS — Z9181 History of falling: Secondary | ICD-10-CM | POA: Diagnosis not present

## 2017-03-04 DIAGNOSIS — M25552 Pain in left hip: Secondary | ICD-10-CM | POA: Diagnosis not present

## 2017-03-06 DIAGNOSIS — S42295D Other nondisplaced fracture of upper end of left humerus, subsequent encounter for fracture with routine healing: Secondary | ICD-10-CM | POA: Diagnosis not present

## 2017-03-06 DIAGNOSIS — S52025D Nondisplaced fracture of olecranon process without intraarticular extension of left ulna, subsequent encounter for closed fracture with routine healing: Secondary | ICD-10-CM | POA: Diagnosis not present

## 2017-03-06 DIAGNOSIS — S52125D Nondisplaced fracture of head of left radius, subsequent encounter for closed fracture with routine healing: Secondary | ICD-10-CM | POA: Diagnosis not present

## 2017-03-06 DIAGNOSIS — S72142D Displaced intertrochanteric fracture of left femur, subsequent encounter for closed fracture with routine healing: Secondary | ICD-10-CM | POA: Diagnosis not present

## 2017-03-08 DIAGNOSIS — Z7409 Other reduced mobility: Secondary | ICD-10-CM | POA: Diagnosis not present

## 2017-03-08 DIAGNOSIS — R5381 Other malaise: Secondary | ICD-10-CM | POA: Diagnosis not present

## 2017-03-08 DIAGNOSIS — F0390 Unspecified dementia without behavioral disturbance: Secondary | ICD-10-CM | POA: Diagnosis not present

## 2017-03-08 DIAGNOSIS — S72002A Fracture of unspecified part of neck of left femur, initial encounter for closed fracture: Secondary | ICD-10-CM | POA: Diagnosis not present

## 2017-03-11 DIAGNOSIS — R278 Other lack of coordination: Secondary | ICD-10-CM | POA: Diagnosis not present

## 2017-03-11 DIAGNOSIS — M6281 Muscle weakness (generalized): Secondary | ICD-10-CM | POA: Diagnosis not present

## 2017-03-12 DIAGNOSIS — M6281 Muscle weakness (generalized): Secondary | ICD-10-CM | POA: Diagnosis not present

## 2017-03-12 DIAGNOSIS — R278 Other lack of coordination: Secondary | ICD-10-CM | POA: Diagnosis not present

## 2017-03-13 DIAGNOSIS — M6281 Muscle weakness (generalized): Secondary | ICD-10-CM | POA: Diagnosis not present

## 2017-03-13 DIAGNOSIS — R278 Other lack of coordination: Secondary | ICD-10-CM | POA: Diagnosis not present

## 2017-03-14 DIAGNOSIS — M25552 Pain in left hip: Secondary | ICD-10-CM | POA: Diagnosis not present

## 2017-03-14 DIAGNOSIS — Z9181 History of falling: Secondary | ICD-10-CM | POA: Diagnosis not present

## 2017-03-14 DIAGNOSIS — F0390 Unspecified dementia without behavioral disturbance: Secondary | ICD-10-CM | POA: Diagnosis not present

## 2017-03-15 DIAGNOSIS — R278 Other lack of coordination: Secondary | ICD-10-CM | POA: Diagnosis not present

## 2017-03-15 DIAGNOSIS — M6281 Muscle weakness (generalized): Secondary | ICD-10-CM | POA: Diagnosis not present

## 2017-03-16 DIAGNOSIS — M6281 Muscle weakness (generalized): Secondary | ICD-10-CM | POA: Diagnosis not present

## 2017-03-16 DIAGNOSIS — R278 Other lack of coordination: Secondary | ICD-10-CM | POA: Diagnosis not present

## 2017-03-18 DIAGNOSIS — R278 Other lack of coordination: Secondary | ICD-10-CM | POA: Diagnosis not present

## 2017-03-18 DIAGNOSIS — M6281 Muscle weakness (generalized): Secondary | ICD-10-CM | POA: Diagnosis not present

## 2017-03-19 DIAGNOSIS — R278 Other lack of coordination: Secondary | ICD-10-CM | POA: Diagnosis not present

## 2017-03-19 DIAGNOSIS — M6281 Muscle weakness (generalized): Secondary | ICD-10-CM | POA: Diagnosis not present

## 2017-03-20 DIAGNOSIS — M25552 Pain in left hip: Secondary | ICD-10-CM | POA: Diagnosis not present

## 2017-03-20 DIAGNOSIS — S42295D Other nondisplaced fracture of upper end of left humerus, subsequent encounter for fracture with routine healing: Secondary | ICD-10-CM | POA: Diagnosis not present

## 2017-03-20 DIAGNOSIS — M6281 Muscle weakness (generalized): Secondary | ICD-10-CM | POA: Diagnosis not present

## 2017-03-20 DIAGNOSIS — R278 Other lack of coordination: Secondary | ICD-10-CM | POA: Diagnosis not present

## 2017-03-21 DIAGNOSIS — M6281 Muscle weakness (generalized): Secondary | ICD-10-CM | POA: Diagnosis not present

## 2017-03-21 DIAGNOSIS — R278 Other lack of coordination: Secondary | ICD-10-CM | POA: Diagnosis not present

## 2017-03-22 DIAGNOSIS — M6281 Muscle weakness (generalized): Secondary | ICD-10-CM | POA: Diagnosis not present

## 2017-03-22 DIAGNOSIS — R278 Other lack of coordination: Secondary | ICD-10-CM | POA: Diagnosis not present

## 2017-03-25 DIAGNOSIS — R278 Other lack of coordination: Secondary | ICD-10-CM | POA: Diagnosis not present

## 2017-03-25 DIAGNOSIS — M6281 Muscle weakness (generalized): Secondary | ICD-10-CM | POA: Diagnosis not present

## 2017-03-26 DIAGNOSIS — R278 Other lack of coordination: Secondary | ICD-10-CM | POA: Diagnosis not present

## 2017-03-26 DIAGNOSIS — M6281 Muscle weakness (generalized): Secondary | ICD-10-CM | POA: Diagnosis not present

## 2017-03-27 DIAGNOSIS — M6281 Muscle weakness (generalized): Secondary | ICD-10-CM | POA: Diagnosis not present

## 2017-03-27 DIAGNOSIS — M25552 Pain in left hip: Secondary | ICD-10-CM | POA: Diagnosis not present

## 2017-03-27 DIAGNOSIS — M79661 Pain in right lower leg: Secondary | ICD-10-CM | POA: Diagnosis not present

## 2017-03-27 DIAGNOSIS — M79662 Pain in left lower leg: Secondary | ICD-10-CM | POA: Diagnosis not present

## 2017-03-27 DIAGNOSIS — R278 Other lack of coordination: Secondary | ICD-10-CM | POA: Diagnosis not present

## 2017-03-27 DIAGNOSIS — M25551 Pain in right hip: Secondary | ICD-10-CM | POA: Diagnosis not present

## 2017-03-28 DIAGNOSIS — R278 Other lack of coordination: Secondary | ICD-10-CM | POA: Diagnosis not present

## 2017-03-28 DIAGNOSIS — M6281 Muscle weakness (generalized): Secondary | ICD-10-CM | POA: Diagnosis not present

## 2017-03-29 DIAGNOSIS — E039 Hypothyroidism, unspecified: Secondary | ICD-10-CM | POA: Diagnosis not present

## 2017-03-29 DIAGNOSIS — R278 Other lack of coordination: Secondary | ICD-10-CM | POA: Diagnosis not present

## 2017-03-29 DIAGNOSIS — M6281 Muscle weakness (generalized): Secondary | ICD-10-CM | POA: Diagnosis not present

## 2017-04-01 DIAGNOSIS — M6281 Muscle weakness (generalized): Secondary | ICD-10-CM | POA: Diagnosis not present

## 2017-04-01 DIAGNOSIS — R278 Other lack of coordination: Secondary | ICD-10-CM | POA: Diagnosis not present

## 2017-04-02 DIAGNOSIS — R278 Other lack of coordination: Secondary | ICD-10-CM | POA: Diagnosis not present

## 2017-04-02 DIAGNOSIS — M6281 Muscle weakness (generalized): Secondary | ICD-10-CM | POA: Diagnosis not present

## 2017-04-03 DIAGNOSIS — R278 Other lack of coordination: Secondary | ICD-10-CM | POA: Diagnosis not present

## 2017-04-03 DIAGNOSIS — M6281 Muscle weakness (generalized): Secondary | ICD-10-CM | POA: Diagnosis not present

## 2017-04-04 DIAGNOSIS — F0390 Unspecified dementia without behavioral disturbance: Secondary | ICD-10-CM | POA: Diagnosis not present

## 2017-04-04 DIAGNOSIS — M25552 Pain in left hip: Secondary | ICD-10-CM | POA: Diagnosis not present

## 2017-04-04 DIAGNOSIS — R278 Other lack of coordination: Secondary | ICD-10-CM | POA: Diagnosis not present

## 2017-04-04 DIAGNOSIS — M6281 Muscle weakness (generalized): Secondary | ICD-10-CM | POA: Diagnosis not present

## 2017-04-04 DIAGNOSIS — Z9181 History of falling: Secondary | ICD-10-CM | POA: Diagnosis not present

## 2017-04-05 DIAGNOSIS — R278 Other lack of coordination: Secondary | ICD-10-CM | POA: Diagnosis not present

## 2017-04-05 DIAGNOSIS — M6281 Muscle weakness (generalized): Secondary | ICD-10-CM | POA: Diagnosis not present

## 2017-04-09 DIAGNOSIS — M25552 Pain in left hip: Secondary | ICD-10-CM | POA: Diagnosis not present

## 2017-04-09 DIAGNOSIS — F0390 Unspecified dementia without behavioral disturbance: Secondary | ICD-10-CM | POA: Diagnosis not present

## 2017-04-09 DIAGNOSIS — Z9181 History of falling: Secondary | ICD-10-CM | POA: Diagnosis not present

## 2017-04-11 DIAGNOSIS — F0390 Unspecified dementia without behavioral disturbance: Secondary | ICD-10-CM | POA: Diagnosis not present

## 2017-04-11 DIAGNOSIS — Z9181 History of falling: Secondary | ICD-10-CM | POA: Diagnosis not present

## 2017-04-11 DIAGNOSIS — M25552 Pain in left hip: Secondary | ICD-10-CM | POA: Diagnosis not present

## 2017-04-16 DIAGNOSIS — F0281 Dementia in other diseases classified elsewhere with behavioral disturbance: Secondary | ICD-10-CM | POA: Diagnosis not present

## 2017-04-16 DIAGNOSIS — F4489 Other dissociative and conversion disorders: Secondary | ICD-10-CM | POA: Diagnosis not present

## 2017-05-01 ENCOUNTER — Emergency Department (HOSPITAL_COMMUNITY): Payer: Medicare Other

## 2017-05-01 ENCOUNTER — Encounter (HOSPITAL_COMMUNITY): Payer: Self-pay

## 2017-05-01 ENCOUNTER — Emergency Department (HOSPITAL_COMMUNITY)
Admission: EM | Admit: 2017-05-01 | Discharge: 2017-05-01 | Disposition: A | Discharge status: Home / Self Care | Payer: Medicare Other | Attending: Emergency Medicine | Admitting: Emergency Medicine

## 2017-05-01 DIAGNOSIS — W19XXXA Unspecified fall, initial encounter: Secondary | ICD-10-CM

## 2017-05-01 DIAGNOSIS — S79911A Unspecified injury of right hip, initial encounter: Secondary | ICD-10-CM | POA: Diagnosis not present

## 2017-05-01 DIAGNOSIS — S0083XA Contusion of other part of head, initial encounter: Secondary | ICD-10-CM

## 2017-05-01 DIAGNOSIS — Y999 Unspecified external cause status: Secondary | ICD-10-CM | POA: Diagnosis not present

## 2017-05-01 DIAGNOSIS — E119 Type 2 diabetes mellitus without complications: Secondary | ICD-10-CM | POA: Insufficient documentation

## 2017-05-01 DIAGNOSIS — R4182 Altered mental status, unspecified: Secondary | ICD-10-CM | POA: Diagnosis not present

## 2017-05-01 DIAGNOSIS — W050XXA Fall from non-moving wheelchair, initial encounter: Secondary | ICD-10-CM | POA: Diagnosis not present

## 2017-05-01 DIAGNOSIS — Z79899 Other long term (current) drug therapy: Secondary | ICD-10-CM | POA: Insufficient documentation

## 2017-05-01 DIAGNOSIS — E032 Hypothyroidism due to medicaments and other exogenous substances: Secondary | ICD-10-CM | POA: Diagnosis not present

## 2017-05-01 DIAGNOSIS — S0990XA Unspecified injury of head, initial encounter: Secondary | ICD-10-CM | POA: Diagnosis not present

## 2017-05-01 DIAGNOSIS — Z794 Long term (current) use of insulin: Secondary | ICD-10-CM | POA: Diagnosis not present

## 2017-05-01 DIAGNOSIS — F0390 Unspecified dementia without behavioral disturbance: Secondary | ICD-10-CM | POA: Insufficient documentation

## 2017-05-01 DIAGNOSIS — Z7982 Long term (current) use of aspirin: Secondary | ICD-10-CM | POA: Diagnosis not present

## 2017-05-01 DIAGNOSIS — S199XXA Unspecified injury of neck, initial encounter: Secondary | ICD-10-CM | POA: Diagnosis not present

## 2017-05-01 DIAGNOSIS — S098XXA Other specified injuries of head, initial encounter: Secondary | ICD-10-CM | POA: Diagnosis present

## 2017-05-01 DIAGNOSIS — E0800 Diabetes mellitus due to underlying condition with hyperosmolarity without nonketotic hyperglycemic-hyperosmolar coma (NKHHC): Secondary | ICD-10-CM | POA: Diagnosis not present

## 2017-05-01 DIAGNOSIS — I10 Essential (primary) hypertension: Secondary | ICD-10-CM | POA: Insufficient documentation

## 2017-05-01 DIAGNOSIS — Z7901 Long term (current) use of anticoagulants: Secondary | ICD-10-CM | POA: Insufficient documentation

## 2017-05-01 DIAGNOSIS — Z8781 Personal history of (healed) traumatic fracture: Secondary | ICD-10-CM | POA: Insufficient documentation

## 2017-05-01 DIAGNOSIS — S6992XA Unspecified injury of left wrist, hand and finger(s), initial encounter: Secondary | ICD-10-CM | POA: Diagnosis not present

## 2017-05-01 DIAGNOSIS — E039 Hypothyroidism, unspecified: Secondary | ICD-10-CM | POA: Diagnosis not present

## 2017-05-01 DIAGNOSIS — Y939 Activity, unspecified: Secondary | ICD-10-CM | POA: Insufficient documentation

## 2017-05-01 DIAGNOSIS — Y92129 Unspecified place in nursing home as the place of occurrence of the external cause: Secondary | ICD-10-CM | POA: Diagnosis not present

## 2017-05-01 DIAGNOSIS — T148XXA Other injury of unspecified body region, initial encounter: Secondary | ICD-10-CM | POA: Diagnosis not present

## 2017-05-01 DIAGNOSIS — S79912A Unspecified injury of left hip, initial encounter: Secondary | ICD-10-CM | POA: Diagnosis not present

## 2017-05-01 LAB — BASIC METABOLIC PANEL
Anion gap: 11 (ref 5–15)
BUN: 27 mg/dL — AB (ref 6–20)
CHLORIDE: 98 mmol/L — AB (ref 101–111)
CO2: 27 mmol/L (ref 22–32)
Calcium: 9.5 mg/dL (ref 8.9–10.3)
Creatinine, Ser: 0.98 mg/dL (ref 0.44–1.00)
GFR, EST NON AFRICAN AMERICAN: 54 mL/min — AB (ref >60)
Glucose, Bld: 138 mg/dL — ABNORMAL HIGH (ref 65–99)
POTASSIUM: 3.8 mmol/L (ref 3.5–5.1)
SODIUM: 136 mmol/L (ref 135–145)

## 2017-05-01 LAB — CBC WITH DIFFERENTIAL/PLATELET
BASOS PCT: 0 %
Basophils Absolute: 0 10*3/uL (ref 0.0–0.1)
EOS ABS: 0.2 10*3/uL (ref 0.0–0.7)
EOS PCT: 3 %
HCT: 39.5 % (ref 36.0–46.0)
HEMOGLOBIN: 13.6 g/dL (ref 12.0–15.0)
Lymphocytes Relative: 27 %
Lymphs Abs: 1.9 10*3/uL (ref 0.7–4.0)
MCH: 32.2 pg (ref 26.0–34.0)
MCHC: 34.4 g/dL (ref 30.0–36.0)
MCV: 93.6 fL (ref 78.0–100.0)
Monocytes Absolute: 0.8 10*3/uL (ref 0.1–1.0)
Monocytes Relative: 12 %
NEUTROS PCT: 58 %
Neutro Abs: 4 10*3/uL (ref 1.7–7.7)
PLATELETS: 170 10*3/uL (ref 150–400)
RBC: 4.22 MIL/uL (ref 3.87–5.11)
RDW: 13.9 % (ref 11.5–15.5)
WBC: 6.9 10*3/uL (ref 4.0–10.5)

## 2017-05-01 LAB — CBG MONITORING, ED: GLUCOSE-CAPILLARY: 136 mg/dL — AB (ref 65–99)

## 2017-05-01 NOTE — Discharge Instructions (Addendum)
Please read attached information regarding your condition. Continue your home medications as previously prescribed. Return to ED for additional injuries, changes in mental status.

## 2017-05-01 NOTE — ED Notes (Signed)
Bed: WA16 Expected date:  Expected time:  Means of arrival:  Comments: EMS- Fall  

## 2017-05-01 NOTE — ED Triage Notes (Signed)
Per EMS, patient comes from [facility]. She had an unwitnessed fall from her wheelchair. She has a skin tear to arm, there is a spot of blood on her forehead but patient was laying on her arm when found. Pt history of dementia and diabetes. BP 200/98 per EMS. CBG 249. Patient in towel roll and pelvic binding. Per facility patient is to her baseline. Pt normally yells and moans whenever she is moved.

## 2017-05-01 NOTE — ED Notes (Signed)
[facility] called for transport.  

## 2017-05-01 NOTE — ED Notes (Signed)
Phlebotomy contacted again regarding labs. Stated they will be here as soon as they can.

## 2017-05-01 NOTE — ED Notes (Signed)
Tried to draw labs and was not successful.  Called lab to come and get labs.  They will be up shortly.

## 2017-05-01 NOTE — ED Notes (Signed)
Attempted blood draw, unsuccessful. A NT to try.

## 2017-05-01 NOTE — ED Notes (Signed)
[facility] called to let know that patient is coming back. Pt resides [facility], room 505.

## 2017-05-01 NOTE — ED Provider Notes (Signed)
[facility]-EMERGENCY DEPT Provider Note   CSN: [id number] Arrival date & time: 05/01/17  1226     History   Chief Complaint Chief Complaint  Patient presents with  . Fall    HPI LYLLIANA KITAMURA is a 76 y.o. female with past medical history of dementia, diabetes, hypertension, prior hip replacement surgery, who presents to ED for evaluation of an unwitnessed fall from her wheelchair.  She resides at a rehab facility and staff is unsure how she fell.  They state that when they found her she was laying on her stomach and arm. Remainder of HPI is limited due to dementia and unwitnessed fall.  HPI  Past Medical History:  Diagnosis Date  . Dementia   . Diabetes mellitus   . Hypertension     Patient Active Problem List   Diagnosis Date Noted  . Thrombocytopenia (HCC)   . Acute metabolic encephalopathy   . Hip fracture (HCC) 02/18/2017  . Closed intertrochanteric fracture of left femur (HCC) 02/18/2017  . Closed fracture of right proximal radius and ulna 02/18/2017  . Closed fracture of proximal end of right humerus 02/18/2017  . Fall 09/25/2016  . Acute encephalopathy 09/25/2016  . Wrist fracture, closed, right, initial encounter 09/25/2016  . Hypoglycemia 09/25/2016  . Normocytic anemia 09/25/2016  . Abdominal pain 09/25/2016  . Closed fracture of right wrist   . Psychosis (HCC) 12/22/2012  . High anion gap metabolic acidosis 12/17/2012  . DKA (diabetic ketoacidoses) (HCC) 12/17/2012  . Altered mental status 12/17/2012  . Severe Diabetic gastroparesis 12/17/2012  . H/O diarrhea 12/17/2012  . Dementia 12/17/2012  . Hypothyroidism 12/17/2012  . Diabetes mellitus, type 2 (HCC) 12/17/2012  . Hypokalemia 12/17/2012  . Dehydration 12/17/2012  . Chronic pain 12/17/2012    Past Surgical History:  Procedure Laterality Date  . BACK SURGERY    . FEMUR IM NAIL Left 02/18/2017   Procedure: INTRAMEDULLARY (IM) NAIL FEMORAL;  Surgeon: Sheral Apley, MD;   Location: [facility] OR;  Service: Orthopedics;  Laterality: Left;  . INNER EAR SURGERY    . NOSE SURGERY      OB History    No data available       Home Medications    Prior to Admission medications   Medication Sig Start Date End Date Taking? Authorizing Provider  aspirin EC 81 MG tablet Take 81 mg by mouth 2 (two) times a week. Monday and Friday.   Yes [provider]  Calcium Carbonate-Vitamin D (CALCIUM-VITAMIN D) 600-200 MG-UNIT CAPS Take 1 tablet by mouth daily.    [provider]  cholecalciferol (VITAMIN D) 1000 UNITS tablet Take 1,000 Units by mouth daily.    [provider]  divalproex (DEPAKOTE SPRINKLE) 125 MG capsule Take 1 capsule (125 mg total) by mouth every 12 (twelve) hours. 12/23/12   Georgann Housekeeper, MD  donepezil (ARICEPT) 10 MG tablet Take 10 mg by mouth at bedtime.      [provider]  enoxaparin (LOVENOX) 40 MG/0.4ML injection Inject 0.4 mLs (40 mg total) into the skin daily. For 30 days post op for DVT prophylaxis 02/20/17 03/22/17  Albina Billet III, PA-C  fish oil-omega-3 fatty acids 1000 MG capsule Take 1 g by mouth daily.    [provider]  HYDROcodone-acetaminophen (NORCO) 5-325 MG tablet Take 1 tablet by mouth every 6 (six) hours as needed for moderate pain. 02/20/17   Albina Billet III, PA-C  insulin aspart (NOVOLOG) 100 UNIT/ML injection Inject 0-10  Units into the skin See admin instructions. Inject 0-10 units subcutaneously before meals and at bedtime per sliding scale: CBG 60-149 0 units, 150-250 5 units, 251-300 8 units, 301-350 10 units, >350 notify provider    [provider]  insulin glargine (LANTUS) 100 UNIT/ML injection Inject 10 Units into the skin at bedtime.    [provider]  levothyroxine (SYNTHROID) 125 MCG tablet Take 1 tablet (125 mcg total) by mouth daily. Patient not taking: Reported on 05/01/2017 02/23/17 02/23/18  Maxie BarbBhandari, Dron Prasad, MD  levothyroxine  (SYNTHROID, LEVOTHROID) 50 MCG tablet Take 50 mcg by mouth daily before breakfast.    [provider]  lovastatin (MEVACOR) 20 MG tablet Take 20 mg by mouth at bedtime.    [provider]  magnesium oxide (MAG-OX) 400 MG tablet Take 200 mg by mouth daily.    [provider]  Multiple Vitamin (MULITIVITAMIN WITH MINERALS) TABS Take 1 tablet by mouth every evening.     [provider]  potassium chloride (K-DUR,KLOR-CON) 10 MEQ tablet Take 1 tablet (10 mEq total) by mouth 2 (two) times daily. 12/23/12   Georgann HousekeeperHusain, Karrar, MD  QUEtiapine (SEROQUEL) 50 MG tablet Take 1 tablet (50 mg total) by mouth at bedtime. 12/23/12   Georgann HousekeeperHusain, Karrar, MD  vitamin B-12 (CYANOCOBALAMIN) 1000 MCG tablet Take 1,000 mcg by mouth every evening.     [provider]  vitamin C (ASCORBIC ACID) 500 MG tablet Take 500 mg by mouth every evening.     [provider]    Family History Family History  Problem Relation Age of Onset  . Dementia Mother     Social History Social History   Tobacco Use  . Smoking status: Never Smoker  . Smokeless tobacco: Never Used  Substance Use Topics  . Alcohol use: No  . Drug use: No     Allergies   Bactrim   Review of Systems Review of Systems  Unable to perform ROS: Dementia  Skin: Positive for wound.     Physical Exam Updated Vital Signs BP (!) 184/80   Pulse 88   Temp 98.6 F (37 C) (Oral)   Resp 16   Ht 5\' 4"  (1.626 m)   Wt 59 kg (130 lb)   SpO2 98%   BMI 22.31 kg/m   Physical Exam  Constitutional: She appears well-developed and well-nourished. No distress.  C-collar and pelvic binder in place.  HENT:  Head: Normocephalic and atraumatic.  Nose: Nose normal.  Eyes: Conjunctivae and EOM are normal. Right eye exhibits no discharge. Left eye exhibits no discharge. No scleral icterus.  Neck: Normal range of motion. Neck supple.  Cardiovascular: Normal rate, regular rhythm, normal heart sounds and intact distal  pulses. Exam reveals no gallop and no friction rub.  No murmur heard. Pulmonary/Chest: Effort normal and breath sounds normal. No respiratory distress.  Abdominal: Soft. Bowel sounds are normal. She exhibits no distension. There is no tenderness. There is no guarding.  Musculoskeletal: Normal range of motion. She exhibits no edema.  Neurological: She is alert.  Skin: Skin is warm and dry. No rash noted. There is erythema.  Superficial skin abrasions noted to forehead and L forearm.  Psychiatric: She has a normal mood and affect.  Nursing note and vitals reviewed.    ED Treatments / Results  Labs (all labs ordered are listed, but only abnormal results are displayed) Labs Reviewed  BASIC METABOLIC PANEL - Abnormal; Notable for the following components:      Result  Value   Chloride 98 (*)    Glucose, Bld 138 (*)    BUN 27 (*)    GFR calc non Af Amer 54 (*)    All other components within normal limits  CBG MONITORING, ED - Abnormal; Notable for the following components:   Glucose-Capillary 136 (*)    All other components within normal limits  CBC WITH DIFFERENTIAL/PLATELET    EKG  EKG Interpretation None       Radiology Ct Head Wo Contrast  Result Date: 05/01/2017 CLINICAL DATA:  Unwitnessed fall with bleeding from forehead. Dementia. EXAM: CT HEAD WITHOUT CONTRAST CT CERVICAL SPINE WITHOUT CONTRAST TECHNIQUE: Multidetector CT imaging of the head and cervical spine was performed following the standard protocol without intravenous contrast. Multiplanar CT image reconstructions of the cervical spine were also generated. COMPARISON:  02/20/2017 FINDINGS: CT HEAD FINDINGS Brain: No acute intracranial hemorrhage, midline shift or edema. No large vascular territory infarct. Chronic small lentiform nucleus lacunar infarct internally on the right. Chronic stable diffuse atrophy with patchy small vessel ischemic disease in the periventricular white matter and centrum semiovale. Vascular:  No hyperdense vessel or unexpected calcification. Atherosclerotic calcifications noted of the carotid siphons. Skull: No skull fracture or suspicious osseous lesions. Right-sided cochlear implant accounting for streak artifacts. Sinuses/Orbits: No acute finding. Other: Mild soft tissue swelling of the forehead. CT CERVICAL SPINE FINDINGS Alignment: Normal cervical lordosis. Skull base and vertebrae: No acute cervical spine fracture or listhesis. Soft tissues and spinal canal: No prevertebral fluid or swelling. No visible canal hematoma. Disc levels: Mild disc space narrowing at all levels of the cervical spine with moderate disc space narrowing at C5-6. C4-5 and C5-6 bilateral uncovertebral joint osteoarthritis with uncinate spurs. Uncinate spurs contribute to mild bilateral neural foraminal encroachment at C5-6. Upper chest: Negative. Other: Thyroidectomy clips are noted. Moderate right and moderate-to-marked left extracranial carotid arteriosclerosis. Aortic atherosclerosis of the included arch. IMPRESSION: 1. Chronic small vessel ischemic disease with cerebral atrophy. No acute intracranial abnormality. Mild soft tissue swelling of the forehead. 2. No acute cervical spine fracture or posttraumatic subluxation. 3. Degenerative disc disease with mild disc space narrowing at all levels with moderate disc space narrowing at C5-6. C4-5 and C5-6 uncovertebral joint osteoarthritis. 4. Thyroidectomy. 5. Moderate-to-marked extracranial carotid arteriosclerosis. Electronically Signed   By: Tollie Ethavid  Kwon M.D.   On: 05/01/2017 14:58   Ct Cervical Spine Wo Contrast  Result Date: 05/01/2017 CLINICAL DATA:  Unwitnessed fall with bleeding from forehead. Dementia. EXAM: CT HEAD WITHOUT CONTRAST CT CERVICAL SPINE WITHOUT CONTRAST TECHNIQUE: Multidetector CT imaging of the head and cervical spine was performed following the standard protocol without intravenous contrast. Multiplanar CT image reconstructions of the cervical spine  were also generated. COMPARISON:  02/20/2017 FINDINGS: CT HEAD FINDINGS Brain: No acute intracranial hemorrhage, midline shift or edema. No large vascular territory infarct. Chronic small lentiform nucleus lacunar infarct internally on the right. Chronic stable diffuse atrophy with patchy small vessel ischemic disease in the periventricular white matter and centrum semiovale. Vascular: No hyperdense vessel or unexpected calcification. Atherosclerotic calcifications noted of the carotid siphons. Skull: No skull fracture or suspicious osseous lesions. Right-sided cochlear implant accounting for streak artifacts. Sinuses/Orbits: No acute finding. Other: Mild soft tissue swelling of the forehead. CT CERVICAL SPINE FINDINGS Alignment: Normal cervical lordosis. Skull base and vertebrae: No acute cervical spine fracture or listhesis. Soft tissues and spinal canal: No prevertebral fluid or swelling. No visible canal hematoma. Disc levels: Mild disc space narrowing at all levels of the cervical  spine with moderate disc space narrowing at C5-6. C4-5 and C5-6 bilateral uncovertebral joint osteoarthritis with uncinate spurs. Uncinate spurs contribute to mild bilateral neural foraminal encroachment at C5-6. Upper chest: Negative. Other: Thyroidectomy clips are noted. Moderate right and moderate-to-marked left extracranial carotid arteriosclerosis. Aortic atherosclerosis of the included arch. IMPRESSION: 1. Chronic small vessel ischemic disease with cerebral atrophy. No acute intracranial abnormality. Mild soft tissue swelling of the forehead. 2. No acute cervical spine fracture or posttraumatic subluxation. 3. Degenerative disc disease with mild disc space narrowing at all levels with moderate disc space narrowing at C5-6. C4-5 and C5-6 uncovertebral joint osteoarthritis. 4. Thyroidectomy. 5. Moderate-to-marked extracranial carotid arteriosclerosis. Electronically Signed   By: Tollie Eth M.D.   On: 05/01/2017 14:58   Dg Hips  Bilat W Or Wo Pelvis 3-4 Views  Result Date: 05/01/2017 CLINICAL DATA:  Unwitnessed fall from wheelchair. History of previous intertrochanteric fracture on the left with ORIF on February 18, 2017. EXAM: DG HIP (WITH OR WITHOUT PELVIS) 3-4V BILAT COMPARISON:  Fluoro spot radiographs of February 18, 2017 FINDINGS: The bones are subjectively osteopenic. No acute pelvic fracture is observed. There are chronic changes of the inferior pubic ramus on the left and likely on the right. There is narrowing of the right hip joint space. The left hip joint space is reasonably well-maintained. No acute fracture of the right hip is observed. No acute re- fracture on the left is demonstrated. IMPRESSION: No acute abnormality of either hip is observed. The visualized portions of the fixation hardware on the left appear to be stable in position. Stable deformity of the left and likely right inferior pubic rami. Electronically Signed   By: David  [place] M.D.   On: 05/01/2017 14:56    Procedures Procedures (including critical care time)  Medications Ordered in ED Medications - No data to display   Initial Impression / Assessment and Plan / ED Course  I have reviewed the triage vital signs and the nursing notes.  Pertinent labs & imaging results that were available during my care of the patient were reviewed by me and considered in my medical decision making (see chart for details).     Patient presents to ED [facility].  They are unsure if she was trying to get up from her wheelchair however they stated that they found her laying on her stomach with several superficial skin abrasions to her forehead and her left arm.  On physical exam patient's mental status appears similar to baseline based on her son who is at her bedside.  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  There  are several small superficial skin abrasions on the forehead.  There is not appear to be any tenderness to palpation of bilateral arms or C-spine.  However due to patient's dementia in fact of unwitnessed fall, will obtain CT head and neck and basic lab work.  EKG with no ischemic changes.  CT of head and neck return as negative for acute changes.  Will discharge patient back to rehab facility.  Strict return precautions given.  Final Clinical Impressions(s) / ED Diagnoses   Final diagnoses:  Contusion of face, initial encounter  Fall, initial encounter    ED Discharge Orders    None       Dietrich Pates, PA-C 05/01/17 Ronne Binning, MD 05/08/17 0005

## 2017-05-01 NOTE — ED Notes (Signed)
Son called and is on way. No MRI as patient has implant/metal in R ear.

## 2017-05-10 DIAGNOSIS — R262 Difficulty in walking, not elsewhere classified: Secondary | ICD-10-CM | POA: Diagnosis not present

## 2017-05-10 DIAGNOSIS — R2689 Other abnormalities of gait and mobility: Secondary | ICD-10-CM | POA: Diagnosis not present

## 2017-05-10 DIAGNOSIS — M6281 Muscle weakness (generalized): Secondary | ICD-10-CM | POA: Diagnosis not present

## 2017-05-15 DIAGNOSIS — R2689 Other abnormalities of gait and mobility: Secondary | ICD-10-CM | POA: Diagnosis not present

## 2017-05-15 DIAGNOSIS — R2681 Unsteadiness on feet: Secondary | ICD-10-CM | POA: Diagnosis not present

## 2017-05-15 DIAGNOSIS — M6281 Muscle weakness (generalized): Secondary | ICD-10-CM | POA: Diagnosis not present

## 2017-05-16 DIAGNOSIS — R1310 Dysphagia, unspecified: Secondary | ICD-10-CM | POA: Diagnosis not present

## 2017-05-16 DIAGNOSIS — E039 Hypothyroidism, unspecified: Secondary | ICD-10-CM | POA: Diagnosis not present

## 2017-05-16 DIAGNOSIS — R3981 Functional urinary incontinence: Secondary | ICD-10-CM | POA: Diagnosis not present

## 2017-05-16 DIAGNOSIS — Z7409 Other reduced mobility: Secondary | ICD-10-CM | POA: Diagnosis not present

## 2017-05-20 DIAGNOSIS — R531 Weakness: Secondary | ICD-10-CM | POA: Diagnosis not present

## 2017-05-29 DIAGNOSIS — R2689 Other abnormalities of gait and mobility: Secondary | ICD-10-CM | POA: Diagnosis not present

## 2017-05-29 DIAGNOSIS — M25552 Pain in left hip: Secondary | ICD-10-CM | POA: Diagnosis not present

## 2017-05-29 DIAGNOSIS — R2681 Unsteadiness on feet: Secondary | ICD-10-CM | POA: Diagnosis not present

## 2017-05-29 DIAGNOSIS — Z9181 History of falling: Secondary | ICD-10-CM | POA: Diagnosis not present

## 2017-05-29 DIAGNOSIS — M6281 Muscle weakness (generalized): Secondary | ICD-10-CM | POA: Diagnosis not present

## 2017-05-29 DIAGNOSIS — F0390 Unspecified dementia without behavioral disturbance: Secondary | ICD-10-CM | POA: Diagnosis not present

## 2017-05-30 DIAGNOSIS — M6281 Muscle weakness (generalized): Secondary | ICD-10-CM | POA: Diagnosis not present

## 2017-05-30 DIAGNOSIS — R2689 Other abnormalities of gait and mobility: Secondary | ICD-10-CM | POA: Diagnosis not present

## 2017-05-30 DIAGNOSIS — R2681 Unsteadiness on feet: Secondary | ICD-10-CM | POA: Diagnosis not present

## 2017-05-31 DIAGNOSIS — Z79899 Other long term (current) drug therapy: Secondary | ICD-10-CM | POA: Diagnosis not present

## 2017-05-31 DIAGNOSIS — E559 Vitamin D deficiency, unspecified: Secondary | ICD-10-CM | POA: Diagnosis not present

## 2017-05-31 DIAGNOSIS — R2681 Unsteadiness on feet: Secondary | ICD-10-CM | POA: Diagnosis not present

## 2017-05-31 DIAGNOSIS — R2689 Other abnormalities of gait and mobility: Secondary | ICD-10-CM | POA: Diagnosis not present

## 2017-05-31 DIAGNOSIS — M6281 Muscle weakness (generalized): Secondary | ICD-10-CM | POA: Diagnosis not present

## 2017-06-03 DIAGNOSIS — R2689 Other abnormalities of gait and mobility: Secondary | ICD-10-CM | POA: Diagnosis not present

## 2017-06-03 DIAGNOSIS — R2681 Unsteadiness on feet: Secondary | ICD-10-CM | POA: Diagnosis not present

## 2017-06-03 DIAGNOSIS — M6281 Muscle weakness (generalized): Secondary | ICD-10-CM | POA: Diagnosis not present

## 2017-06-04 DIAGNOSIS — M6281 Muscle weakness (generalized): Secondary | ICD-10-CM | POA: Diagnosis not present

## 2017-06-04 DIAGNOSIS — R2681 Unsteadiness on feet: Secondary | ICD-10-CM | POA: Diagnosis not present

## 2017-06-04 DIAGNOSIS — R2689 Other abnormalities of gait and mobility: Secondary | ICD-10-CM | POA: Diagnosis not present

## 2017-06-05 DIAGNOSIS — E119 Type 2 diabetes mellitus without complications: Secondary | ICD-10-CM | POA: Diagnosis not present

## 2017-06-05 DIAGNOSIS — R748 Abnormal levels of other serum enzymes: Secondary | ICD-10-CM | POA: Diagnosis not present

## 2017-06-05 DIAGNOSIS — R2681 Unsteadiness on feet: Secondary | ICD-10-CM | POA: Diagnosis not present

## 2017-06-05 DIAGNOSIS — R2689 Other abnormalities of gait and mobility: Secondary | ICD-10-CM | POA: Diagnosis not present

## 2017-06-05 DIAGNOSIS — M6281 Muscle weakness (generalized): Secondary | ICD-10-CM | POA: Diagnosis not present

## 2017-06-06 DIAGNOSIS — R2681 Unsteadiness on feet: Secondary | ICD-10-CM | POA: Diagnosis not present

## 2017-06-06 DIAGNOSIS — R2689 Other abnormalities of gait and mobility: Secondary | ICD-10-CM | POA: Diagnosis not present

## 2017-06-06 DIAGNOSIS — M6281 Muscle weakness (generalized): Secondary | ICD-10-CM | POA: Diagnosis not present

## 2017-06-07 DIAGNOSIS — R2689 Other abnormalities of gait and mobility: Secondary | ICD-10-CM | POA: Diagnosis not present

## 2017-06-07 DIAGNOSIS — M6281 Muscle weakness (generalized): Secondary | ICD-10-CM | POA: Diagnosis not present

## 2017-06-07 DIAGNOSIS — R2681 Unsteadiness on feet: Secondary | ICD-10-CM | POA: Diagnosis not present

## 2017-06-10 DIAGNOSIS — R2689 Other abnormalities of gait and mobility: Secondary | ICD-10-CM | POA: Diagnosis not present

## 2017-06-10 DIAGNOSIS — R2681 Unsteadiness on feet: Secondary | ICD-10-CM | POA: Diagnosis not present

## 2017-06-10 DIAGNOSIS — M6281 Muscle weakness (generalized): Secondary | ICD-10-CM | POA: Diagnosis not present

## 2017-06-11 DIAGNOSIS — R296 Repeated falls: Secondary | ICD-10-CM | POA: Diagnosis not present

## 2017-06-12 DIAGNOSIS — R296 Repeated falls: Secondary | ICD-10-CM | POA: Diagnosis not present

## 2017-06-13 DIAGNOSIS — R296 Repeated falls: Secondary | ICD-10-CM | POA: Diagnosis not present

## 2017-06-14 DIAGNOSIS — R296 Repeated falls: Secondary | ICD-10-CM | POA: Diagnosis not present

## 2017-06-18 DIAGNOSIS — R296 Repeated falls: Secondary | ICD-10-CM | POA: Diagnosis not present

## 2017-06-19 DIAGNOSIS — R296 Repeated falls: Secondary | ICD-10-CM | POA: Diagnosis not present

## 2017-06-20 DIAGNOSIS — R296 Repeated falls: Secondary | ICD-10-CM | POA: Diagnosis not present

## 2017-06-21 DIAGNOSIS — R296 Repeated falls: Secondary | ICD-10-CM | POA: Diagnosis not present

## 2017-06-24 DIAGNOSIS — R296 Repeated falls: Secondary | ICD-10-CM | POA: Diagnosis not present

## 2017-06-25 DIAGNOSIS — R296 Repeated falls: Secondary | ICD-10-CM | POA: Diagnosis not present

## 2017-06-26 DIAGNOSIS — R296 Repeated falls: Secondary | ICD-10-CM | POA: Diagnosis not present

## 2017-06-27 DIAGNOSIS — R296 Repeated falls: Secondary | ICD-10-CM | POA: Diagnosis not present

## 2017-07-05 DIAGNOSIS — Z79899 Other long term (current) drug therapy: Secondary | ICD-10-CM | POA: Diagnosis not present

## 2017-08-07 DIAGNOSIS — E039 Hypothyroidism, unspecified: Secondary | ICD-10-CM | POA: Diagnosis not present

## 2017-08-07 DIAGNOSIS — R296 Repeated falls: Secondary | ICD-10-CM | POA: Diagnosis not present

## 2017-08-07 DIAGNOSIS — E119 Type 2 diabetes mellitus without complications: Secondary | ICD-10-CM | POA: Diagnosis not present

## 2017-08-07 DIAGNOSIS — F0281 Dementia in other diseases classified elsewhere with behavioral disturbance: Secondary | ICD-10-CM | POA: Diagnosis not present

## 2017-08-13 DIAGNOSIS — M25461 Effusion, right knee: Secondary | ICD-10-CM | POA: Diagnosis not present

## 2017-09-04 DIAGNOSIS — R531 Weakness: Secondary | ICD-10-CM | POA: Diagnosis not present

## 2017-10-07 ENCOUNTER — Non-Acute Institutional Stay: Payer: Medicaid Other | Admitting: Hospice and Palliative Medicine

## 2017-10-07 DIAGNOSIS — Z515 Encounter for palliative care: Secondary | ICD-10-CM

## 2017-10-07 NOTE — Progress Notes (Signed)
PALLIATIVE CARE CONSULT VISIT   PATIENT NAME: Sheena [name] DOB: 01/30/1941 MRN: [id number]  PRIMARY CARE PROVIDER: Georgann Housekeeper, MD  REFERRING PROVIDER: Georgann Housekeeper, MD [address] [place], [place] [address]  RESPONSIBLE PARTY:   Caterra Ostroff - [phone]   RECOMMENDATIONS and PLAN:  1. Weakness - wheelchair bound and dependent upon staff for all ADLs.  2. FTT - weight 143.6 with BMI of 23. Oral intake had reportedly declined over the past few months. However, staff now tell me she is eating better when fed. Likely she is no longer able to adequately feed herself due to dementia. Will monitor.   I spent 10 minutes providing this consultation,  from 1400 to 1410. More than 50% of the time in this consultation was spent coordinating communication.   HISTORY OF PRESENT ILLNESS:  Sheena [name] is a 77 yo woman with multiple medical problems including dementia, HTN, HLD, DM, who has been followed by palliative care at SNF for FTT and progression of the dementia. Routine follow up visit made today. Staff report that patient has been relatively stable over the past month.   CODE STATUS: DNR  PPS: 30% HOSPICE ELIGIBILITY/DIAGNOSIS: TBD  PAST MEDICAL HISTORY:  Past Medical History:  Diagnosis Date  . Dementia   . Diabetes mellitus   . Hypertension     SOCIAL HX:  Social History   Tobacco Use  . Smoking status: Never Smoker  . Smokeless tobacco: Never Used  Substance Use Topics  . Alcohol use: No    ALLERGIES:  Allergies  Allergen Reactions  . Bactrim Nausea And Vomiting     PERTINENT MEDICATIONS:  Outpatient Encounter Medications as of 10/07/2017  Medication Sig  . aspirin EC 81 MG tablet Take 81 mg by mouth 2 (two) times a week. Monday and Friday.  . Calcium Carbonate-Vitamin D (CALCIUM-VITAMIN D) 600-200 MG-UNIT CAPS Take 1 tablet by mouth daily.  . cholecalciferol (VITAMIN D) 1000 UNITS tablet Take 1,000 Units by mouth daily.  . divalproex  (DEPAKOTE SPRINKLE) 125 MG capsule Take 1 capsule (125 mg total) by mouth every 12 (twelve) hours.  Marland Kitchen donepezil (ARICEPT) 10 MG tablet Take 10 mg by mouth at bedtime.    . enoxaparin (LOVENOX) 40 MG/0.4ML injection Inject 0.4 mLs (40 mg total) into the skin daily. For 30 days post op for DVT prophylaxis  . fish oil-omega-3 fatty acids 1000 MG capsule Take 1 g by mouth daily.  Marland Kitchen HYDROcodone-acetaminophen (NORCO) 5-325 MG tablet Take 1 tablet by mouth every 6 (six) hours as needed for moderate pain.  Marland Kitchen insulin aspart (NOVOLOG) 100 UNIT/ML injection Inject 0-10 Units into the skin See admin instructions. Inject 0-10 units subcutaneously before meals and at bedtime per sliding scale: CBG 60-149 0 units, 150-250 5 units, 251-300 8 units, 301-350 10 units, >350 notify provider  . insulin glargine (LANTUS) 100 UNIT/ML injection Inject 10 Units into the skin at bedtime.  Marland Kitchen levothyroxine (SYNTHROID) 125 MCG tablet Take 1 tablet (125 mcg total) by mouth daily. (Patient not taking: Reported on 05/01/2017)  . levothyroxine (SYNTHROID, LEVOTHROID) 50 MCG tablet Take 50 mcg by mouth daily before breakfast.  . lovastatin (MEVACOR) 20 MG tablet Take 20 mg by mouth at bedtime.  . magnesium oxide (MAG-OX) 400 MG tablet Take 200 mg by mouth daily.  . Multiple Vitamin (MULITIVITAMIN WITH MINERALS) TABS Take 1 tablet by mouth every evening.   . potassium chloride (K-DUR,KLOR-CON) 10 MEQ tablet Take 1 tablet (10  mEq total) by mouth 2 (two) times daily.  . QUEtiapine (SEROQUEL) 50 MG tablet Take 1 tablet (50 mg total) by mouth at bedtime.  . vitamin B-12 (CYANOCOBALAMIN) 1000 MCG tablet Take 1,000 mcg by mouth every evening.   . vitamin C (ASCORBIC ACID) 500 MG tablet Take 500 mg by mouth every evening.    No facility-administered encounter medications on file as of 10/07/2017.     PHYSICAL EXAM:   General: NAD, frail appearing, thin, sitting in wheelchair Pulmonary: unlabored Extremities: no edema, no joint  deformities Skin: no rashes Neurological: Weakness, confusion, says a few words  Malachy Moan, NP

## 2017-10-10 DIAGNOSIS — F0390 Unspecified dementia without behavioral disturbance: Secondary | ICD-10-CM | POA: Diagnosis not present

## 2017-10-10 DIAGNOSIS — E119 Type 2 diabetes mellitus without complications: Secondary | ICD-10-CM | POA: Diagnosis not present

## 2017-10-10 DIAGNOSIS — H524 Presbyopia: Secondary | ICD-10-CM | POA: Diagnosis not present

## 2017-10-10 DIAGNOSIS — H2513 Age-related nuclear cataract, bilateral: Secondary | ICD-10-CM | POA: Diagnosis not present

## 2017-11-20 ENCOUNTER — Non-Acute Institutional Stay: Payer: Self-pay | Admitting: Hospice and Palliative Medicine

## 2017-11-20 DIAGNOSIS — Z515 Encounter for palliative care: Secondary | ICD-10-CM

## 2017-11-21 NOTE — Progress Notes (Signed)
PALLIATIVE CARE CONSULT VISIT   PATIENT NAME: Sheena [name] DOB: Jul 05, 1940 MRN: [id number]  PRIMARY CARE PROVIDER: Georgann Housekeeper, MD  REFERRING PROVIDER: No referring provider defined for this encounter.  RESPONSIBLE PARTY:   Son Sheena [name] - [phone]  ASSESSMENT: Appears to be relatively unchanged, albeit with slow progression in dementia. Oral intake stable when fed. Nursing staff have no acute concerns. No family present and patient unable to engage in meaningful conversation.    RECOMMENDATIONS and PLAN:  Continue supportive care. Consider hospice with persistent decline. Will follow.   I spent 10 minutes providing this consultation,  from 1230 to 1240. More than 50% of the time in this consultation was spent coordinating communication.   HISTORY OF PRESENT ILLNESS:  Sheena [name] is a 77 yo woman with multiple medical problems including dementia, HTN, HLD, DM, who has been followed by palliative care at SNF for FTT and progression of the dementia. Routine follow up visit made today. Staff report that patient has been relatively stable over the past month.   CODE STATUS: DNR  PPS: 30% HOSPICE ELIGIBILITY/DIAGNOSIS: TBD  PAST MEDICAL HISTORY:  Past Medical History:  Diagnosis Date  . Dementia   . Diabetes mellitus   . Hypertension     SOCIAL HX:  Social History   Tobacco Use  . Smoking status: Never Smoker  . Smokeless tobacco: Never Used  Substance Use Topics  . Alcohol use: No    ALLERGIES:  Allergies  Allergen Reactions  . Bactrim Nausea And Vomiting     PERTINENT MEDICATIONS:  Outpatient Encounter Medications as of 11/20/2017  Medication Sig  . aspirin EC 81 MG tablet Take 81 mg by mouth 2 (two) times a week. Monday and Friday.  . Calcium Carbonate-Vitamin D (CALCIUM-VITAMIN D) 600-200 MG-UNIT CAPS Take 1 tablet by mouth daily.  . cholecalciferol (VITAMIN D) 1000 UNITS tablet Take 1,000 Units by mouth daily.  . divalproex (DEPAKOTE SPRINKLE) 125  MG capsule Take 1 capsule (125 mg total) by mouth every 12 (twelve) hours.  Marland Kitchen donepezil (ARICEPT) 10 MG tablet Take 10 mg by mouth at bedtime.    . fish oil-omega-3 fatty acids 1000 MG capsule Take 1 g by mouth daily.  Marland Kitchen HYDROcodone-acetaminophen (NORCO) 5-325 MG tablet Take 1 tablet by mouth every 6 (six) hours as needed for moderate pain.  Marland Kitchen insulin aspart (NOVOLOG) 100 UNIT/ML injection Inject 0-10 Units into the skin See admin instructions. Inject 0-10 units subcutaneously before meals and at bedtime per sliding scale: CBG 60-149 0 units, 150-250 5 units, 251-300 8 units, 301-350 10 units, >350 notify provider  . insulin glargine (LANTUS) 100 UNIT/ML injection Inject 10 Units into the skin at bedtime.  Marland Kitchen levothyroxine (SYNTHROID) 125 MCG tablet Take 1 tablet (125 mcg total) by mouth daily. (Patient not taking: Reported on 05/01/2017)  . levothyroxine (SYNTHROID, LEVOTHROID) 50 MCG tablet Take 50 mcg by mouth daily before breakfast.  . lovastatin (MEVACOR) 20 MG tablet Take 20 mg by mouth at bedtime.  . magnesium oxide (MAG-OX) 400 MG tablet Take 200 mg by mouth daily.  . Multiple Vitamin (MULITIVITAMIN WITH MINERALS) TABS Take 1 tablet by mouth every evening.   . potassium chloride (K-DUR,KLOR-CON) 10 MEQ tablet Take 1 tablet (10 mEq total) by mouth 2 (two) times daily.  . QUEtiapine (SEROQUEL) 50 MG tablet Take 1 tablet (50 mg total) by mouth at bedtime.  . vitamin B-12 (CYANOCOBALAMIN) 1000 MCG tablet Take 1,000 mcg by mouth every evening.   Marland Kitchen  vitamin C (ASCORBIC ACID) 500 MG tablet Take 500 mg by mouth every evening.    No facility-administered encounter medications on file as of 11/20/2017.     PHYSICAL EXAM:   General: NAD, frail appearing, thin, sitting in wheelchair Pulmonary: CTA Extremities: no edema, no joint deformities Skin: no rashes Neurological: Weakness, confusion, says a few words  Sheena MoanJOSHUA R BORDERS, NP

## 2018-03-07 DIAGNOSIS — Z7409 Other reduced mobility: Secondary | ICD-10-CM | POA: Diagnosis not present

## 2018-03-07 DIAGNOSIS — R531 Weakness: Secondary | ICD-10-CM | POA: Diagnosis not present

## 2018-03-07 DIAGNOSIS — F0390 Unspecified dementia without behavioral disturbance: Secondary | ICD-10-CM | POA: Diagnosis not present

## 2018-03-19 ENCOUNTER — Non-Acute Institutional Stay: Payer: Medicare Other | Admitting: Primary Care

## 2018-03-19 DIAGNOSIS — Z515 Encounter for palliative care: Secondary | ICD-10-CM

## 2018-03-19 DIAGNOSIS — Z794 Long term (current) use of insulin: Secondary | ICD-10-CM

## 2018-03-19 DIAGNOSIS — E1165 Type 2 diabetes mellitus with hyperglycemia: Secondary | ICD-10-CM

## 2018-03-20 NOTE — Progress Notes (Signed)
PALLIATIVE CARE CONSULT VISIT   PATIENT NAME: Sheena [name] DOB: [birth year] MRN: [id number]  PRIMARY CARE PROVIDER:   Georgann Housekeeper, MD  REFERRING PROVIDER:  Georgann Housekeeper, MD [address] [place], [place] [address]  RESPONSIBLE PARTY:     ASSESSMENT:   Continued following by Palliative care for goals of care, comfort care. Patient is very hard of hearing and difficult therefore to  interview. Staff states she's had more resistance to care. Also report high and labile blood glucose readings, often in the 3-400s. An odor is noted by this Clinical research associate and staff and appears to be oral in source.   VS= 98.4-85-18 169/87  RECOMMENDATIONS and PLAN:  1. Recommend dental consult to see if patient has an oral abscess causing odor/pain. She is unable to report.  2. Continue to follow and contact family for goals of care discussion. Attempted to phone son and daughter, but not able to reach either.   I spent 15 minutes providing this consultation,  From 1215  To 1230 . More than 50% of the time in this consultation was spent coordinating communication.   HISTORY OF PRESENT ILLNESS:  Sheena [name] is a 77 y.o. year old female with multiple medical problems including diabetes, dementia and hypothyroidism and sensory deficits (Hearing). Palliative Care was asked to help address goals of care.   CODE STATUS: unknown  PPS: 30% HOSPICE ELIGIBILITY/DIAGNOSIS: TBD  PAST MEDICAL HISTORY:  Past Medical History:  Diagnosis Date  . Dementia   . Diabetes mellitus   . Hypertension     SOCIAL HX:  Social History   Tobacco Use  . Smoking status: Never Smoker  . Smokeless tobacco: Never Used  Substance Use Topics  . Alcohol use: No    ALLERGIES:  Allergies  Allergen Reactions  . Bactrim Nausea And Vomiting     PERTINENT MEDICATIONS:  Outpatient Encounter Medications as of 03/19/2018  Medication Sig  . aspirin EC 81 MG tablet Take 81 mg by mouth 2 (two) times a week. Monday and  Friday.  . Calcium Carbonate-Vitamin D (CALCIUM-VITAMIN D) 600-200 MG-UNIT CAPS Take 1 tablet by mouth daily.  . cholecalciferol (VITAMIN D) 1000 UNITS tablet Take 1,000 Units by mouth daily.  . divalproex (DEPAKOTE SPRINKLE) 125 MG capsule Take 1 capsule (125 mg total) by mouth every 12 (twelve) hours.  Marland Kitchen donepezil (ARICEPT) 10 MG tablet Take 10 mg by mouth at bedtime.    . enoxaparin (LOVENOX) 40 MG/0.4ML injection Inject 0.4 mLs (40 mg total) into the skin daily. For 30 days post op for DVT prophylaxis  . fish oil-omega-3 fatty acids 1000 MG capsule Take 1 g by mouth daily.  Marland Kitchen HYDROcodone-acetaminophen (NORCO) 5-325 MG tablet Take 1 tablet by mouth every 6 (six) hours as needed for moderate pain.  Marland Kitchen insulin aspart (NOVOLOG) 100 UNIT/ML injection Inject 0-10 Units into the skin See admin instructions. Inject 0-10 units subcutaneously before meals and at bedtime per sliding scale: CBG 60-149 0 units, 150-250 5 units, 251-300 8 units, 301-350 10 units, >350 notify provider  . insulin glargine (LANTUS) 100 UNIT/ML injection Inject 10 Units into the skin at bedtime.  Marland Kitchen levothyroxine (SYNTHROID) 125 MCG tablet Take 1 tablet (125 mcg total) by mouth daily. (Patient not taking: Reported on 05/01/2017)  . levothyroxine (SYNTHROID, LEVOTHROID) 50 MCG tablet Take 50 mcg by mouth daily before breakfast.  . lovastatin (MEVACOR) 20 MG tablet Take 20 mg by mouth at bedtime.  . magnesium oxide (MAG-OX) 400  MG tablet Take 200 mg by mouth daily.  . Multiple Vitamin (MULITIVITAMIN WITH MINERALS) TABS Take 1 tablet by mouth every evening.   . potassium chloride (K-DUR,KLOR-CON) 10 MEQ tablet Take 1 tablet (10 mEq total) by mouth 2 (two) times daily.  . QUEtiapine (SEROQUEL) 50 MG tablet Take 1 tablet (50 mg total) by mouth at bedtime.  . vitamin B-12 (CYANOCOBALAMIN) 1000 MCG tablet Take 1,000 mcg by mouth every evening.   . vitamin C (ASCORBIC ACID) 500 MG tablet Take 500 mg by mouth every evening.    No  facility-administered encounter medications on [name] as of 03/19/2018.     PHYSICAL EXAM:   General: NAD, frail appearing, obese. Very HOH. Prevalent odor on breath Cardiovascular: regular rate and rhythm, S1S2 Pulmonary: clear lung  Fields, no cough noted Abdomen: soft, nontender, + bowel sounds. Incontinent b/b Extremities: no edema, no joint deformities.Feet clear of lesions bil. Skin: no rashes Neurological: Weakness but otherwise nonfocal  Sheena [name] AGPCNP-BC

## 2018-05-05 ENCOUNTER — Non-Acute Institutional Stay: Payer: Medicare Other | Admitting: Primary Care

## 2018-05-05 VITALS — Wt 140.4 lb

## 2018-05-05 DIAGNOSIS — Z515 Encounter for palliative care: Secondary | ICD-10-CM

## 2018-05-05 NOTE — Progress Notes (Signed)
[facility] Telephone: [phone] Fax: [phone]  PATIENT NAME: Sheena [name] DOB: 1941/04/25 MRN: [id number]  PRIMARY CARE PROVIDER:   Bernette Redbird MD  REFERRING PROVIDER:  Georgann Housekeeper, MD [address] [place], [place] [address]  RESPONSIBLE PARTY:    Extended Emergency Contact Information Primary Emergency Contact: Sheena [name] Address: [address] [place], [place] [address] [place] Home Phone: [phone] Relation: Son Secondary Emergency Contact: Sheena [name]  [place] Mobile Phone: [phone] Relation: Other   ASSESSMENT and RECOMMENDATIONS:   1. Goals of Care: DNR, partial scope MOST in building. Patient is sitting up in wheel chair, asleep, not engaging. Staff states this is her norm. She is unable to answer questions, follow commands. FAST score 7 C+. Staff have no additional concerns . Will reach out to family to see if they have any additional concerns.  I spent 15 minutes providing this consultation,  from 1515 to 1530. More than 50% of the time in this consultation was spent coordinating communication.   HISTORY OF PRESENT ILLNESS:  Sheena [name] is a 77 y.o. year old female with multiple medical problems including dementia, diabetes, htn. Palliative Care was asked to help address goals of care.   CODE STATUS: DNR, limited medical interventions, abx, iv fluid trial, no feeding tube  PPS: 30% HOSPICE ELIGIBILITY/DIAGNOSIS: TBD  PAST MEDICAL HISTORY:  Past Medical History:  Diagnosis Date  . Dementia   . Diabetes mellitus   . Hypertension     SOCIAL HX:  Social History   Tobacco Use  . Smoking status: Never Smoker  . Smokeless tobacco: Never Used  Substance Use Topics  . Alcohol use: No    ALLERGIES:  Allergies  Allergen Reactions  . Bactrim Nausea And Vomiting     PERTINENT MEDICATIONS:  Outpatient Encounter Medications as of 05/05/2018  Medication Sig  . aspirin  EC 81 MG tablet Take 81 mg by mouth 2 (two) times a week. Monday and Friday.  . Calcium Carbonate-Vitamin D (CALCIUM-VITAMIN D) 600-200 MG-UNIT CAPS Take 1 tablet by mouth daily.  . cholecalciferol (VITAMIN D) 1000 UNITS tablet Take 1,000 Units by mouth daily.  . divalproex (DEPAKOTE SPRINKLE) 125 MG capsule Take 1 capsule (125 mg total) by mouth every 12 (twelve) hours.  Marland Kitchen donepezil (ARICEPT) 10 MG tablet Take 10 mg by mouth at bedtime.    . enoxaparin (LOVENOX) 40 MG/0.4ML injection Inject 0.4 mLs (40 mg total) into the skin daily. For 30 days post op for DVT prophylaxis  . fish oil-omega-3 fatty acids 1000 MG capsule Take 1 g by mouth daily.  Marland Kitchen HYDROcodone-acetaminophen (NORCO) 5-325 MG tablet Take 1 tablet by mouth every 6 (six) hours as needed for moderate pain.  Marland Kitchen insulin aspart (NOVOLOG) 100 UNIT/ML injection Inject 0-10 Units into the skin See admin instructions. Inject 0-10 units subcutaneously before meals and at bedtime per sliding scale: CBG 60-149 0 units, 150-250 5 units, 251-300 8 units, 301-350 10 units, >350 notify provider  . insulin glargine (LANTUS) 100 UNIT/ML injection Inject 10 Units into the skin at bedtime.  Marland Kitchen levothyroxine (SYNTHROID) 125 MCG tablet Take 1 tablet (125 mcg total) by mouth daily. (Patient not taking: Reported on 05/01/2017)  . levothyroxine (SYNTHROID, LEVOTHROID) 50 MCG tablet Take 50 mcg by mouth daily before breakfast.  . lovastatin (MEVACOR) 20 MG tablet Take 20 mg by mouth at bedtime.  . magnesium oxide (MAG-OX) 400 MG  tablet Take 200 mg by mouth daily.  . Multiple Vitamin (MULITIVITAMIN WITH MINERALS) TABS Take 1 tablet by mouth every evening.   . potassium chloride (K-DUR,KLOR-CON) 10 MEQ tablet Take 1 tablet (10 mEq total) by mouth 2 (two) times daily.  . QUEtiapine (SEROQUEL) 50 MG tablet Take 1 tablet (50 mg total) by mouth at bedtime.  . vitamin B-12 (CYANOCOBALAMIN) 1000 MCG tablet Take 1,000 mcg by mouth every evening.   . vitamin C (ASCORBIC  ACID) 500 MG tablet Take 500 mg by mouth every evening.    No facility-administered encounter medications on file as of 05/05/2018.     PHYSICAL EXAM:  VS 164/72  97.4-80-18 140.4 lb Eating sporadically.  General: NAD, frail appearing, obese Cardiovascular: regular rate and rhythm, S1S2 Pulmonary: clear ant fields, no cough Abdomen: soft, nontender, + bowel sounds, fair intake Extremities: no edema, no joint deformities Skin: no rashes, no wounds Neurological: Weakness , non verbal, somnolent  Paulina FusiKathryn Bonni Neuser, NP

## 2018-06-09 ENCOUNTER — Non-Acute Institutional Stay: Payer: Medicare Other | Admitting: Primary Care

## 2018-06-10 ENCOUNTER — Non-Acute Institutional Stay: Payer: Medicare Other | Admitting: Primary Care

## 2018-06-10 DIAGNOSIS — E1165 Type 2 diabetes mellitus with hyperglycemia: Secondary | ICD-10-CM

## 2018-06-10 DIAGNOSIS — Z515 Encounter for palliative care: Secondary | ICD-10-CM

## 2018-06-10 DIAGNOSIS — F0390 Unspecified dementia without behavioral disturbance: Secondary | ICD-10-CM

## 2018-06-10 DIAGNOSIS — Z794 Long term (current) use of insulin: Secondary | ICD-10-CM

## 2018-06-10 NOTE — Progress Notes (Signed)
[facility] Telephone: [phone]) [phone] Fax: [phone]  PATIENT NAME: Sheena [name] DOB: 12-04-1940 MRN: [id number]  PRIMARY CARE PROVIDER:   Bernette Redbirdosenthal, Amy, MD  REFERRING PROVIDER:  Bernette Redbirdosenthal, Amy, MD [address] [place], [place] [address]  RESPONSIBLE PARTY:   Extended Emergency Contact Information Primary Emergency Contact: Heiner,David Address: [address]          [place], [place] [address] [place] Home Phone: [phone] Relation: Son Secondary Emergency Contact: Biby,Laura  [place] Mobile Phone: [phone] Relation: Other    ASSESSMENT and RECOMMENDATIONS:   1.Goals of care; POA spoke with SNF team by phone  and selected MOST directives. Date 04/17/18 and witnessed by staff. Medications reviewed and updated.Staff reports patient is eating well and occasionally more interactive.  Palliative to continue to follow for goals of care discussion. Return 2-3  Months or prn.  I spent 25 minutes providing this consultation,  from 1545 to 1610. More than 50% of the time in this consultation was spent coordinating communication.   HISTORY OF PRESENT ILLNESS:  Sheena CapriceDoris B Justiniano is a 77 y.o. year old female with multiple medical problems including dementia, HTN, HLD, DM. Palliative Care was asked to help address goals of care.   CODE STATUS: DNR, Most on record at SNF, per phone conference with poa; limited interventions, abx, iv  For trial period, no feeding tube  PPS: 30% HOSPICE ELIGIBILITY/DIAGNOSIS: TBD/dementia  PAST MEDICAL HISTORY:  Past Medical History:  Diagnosis Date  . Dementia   . Diabetes mellitus   . Hypertension     SOCIAL HX:  Social History   Tobacco Use  . Smoking status: Never Smoker  . Smokeless tobacco: Never Used  Substance Use Topics  . Alcohol use: No    ALLERGIES:  Allergies  Allergen Reactions  . Bactrim Nausea And Vomiting     PERTINENT MEDICATIONS:  Outpatient Encounter Medications as  of 06/10/2018  Medication Sig  . aspirin EC 81 MG tablet Take 81 mg by mouth 2 (two) times a week. Monday and Friday.  . Calcium Carbonate-Vitamin D (CALCIUM-VITAMIN D) 600-200 MG-UNIT CAPS Take 1 tablet by mouth daily.  . cholecalciferol (VITAMIN D) 1000 UNITS tablet Take 1,000 Units by mouth daily.  . divalproex (DEPAKOTE SPRINKLE) 125 MG capsule Take 1 capsule (125 mg total) by mouth every 12 (twelve) hours.  Marland Kitchen. donepezil (ARICEPT) 10 MG tablet Take 10 mg by mouth at bedtime.    . fish oil-omega-3 fatty acids 1000 MG capsule Take 1 g by mouth daily.  . insulin aspart (NOVOLOG) 100 UNIT/ML injection Inject 0-10 Units into the skin See admin instructions. Inject 0-10 units subcutaneously before meals and at bedtime per sliding scale: CBG 60-149 0 units, 150-250 5 units, 251-300 8 units, 301-350 10 units, >350 notify provider  . insulin glargine (LANTUS) 100 UNIT/ML injection Inject 10 Units into the skin at bedtime.  Marland Kitchen. levothyroxine (SYNTHROID) 125 MCG tablet Take 1 tablet (125 mcg total) by mouth daily. (Patient not taking: Reported on 05/01/2017)  . levothyroxine (SYNTHROID, LEVOTHROID) 50 MCG tablet Take 50 mcg by mouth daily before breakfast.  . losartan (COZAAR) 50 MG tablet Take 2 tablets by mouth daily.  . magnesium oxide (MAG-OX) 400 MG tablet Take 200 mg by mouth daily.  . Multiple Vitamin (MULITIVITAMIN WITH MINERALS) TABS Take 1 tablet by mouth every evening.   . potassium chloride (K-DUR,KLOR-CON) 10 MEQ tablet Take 1 tablet (10 mEq total) by mouth 2 (two) times daily.  .Marland Kitchen  QUEtiapine (SEROQUEL) 25 MG tablet Take 1 tablet by mouth Nightly.  . vitamin B-12 (CYANOCOBALAMIN) 1000 MCG tablet Take 1,000 mcg by mouth every evening.   . vitamin C (ASCORBIC ACID) 500 MG tablet Take 500 mg by mouth every evening.   . [DISCONTINUED] enoxaparin (LOVENOX) 40 MG/0.4ML injection Inject 0.4 mLs (40 mg total) into the skin daily. For 30 days post op for DVT prophylaxis  . [DISCONTINUED]  HYDROcodone-acetaminophen (NORCO) 5-325 MG tablet Take 1 tablet by mouth every 6 (six) hours as needed for moderate pain.  . [DISCONTINUED] lovastatin (MEVACOR) 20 MG tablet Take 20 mg by mouth at bedtime.  . [DISCONTINUED] QUEtiapine (SEROQUEL) 50 MG tablet Take 1 tablet (50 mg total) by mouth at bedtime.   No facility-administered encounter medications on file as of 06/10/2018.     PHYSICAL EXAM:   Vs 98.3-95-18 115/64, weight 143.4 lbs General: NAD, frail appearing, thin, alert and interactive but very HOH. Cardiovascular: regular rate and rhythm, S1S2 Pulmonary: clear all fields, no cough Abdomen: soft, nontender, + bowel sounds, appetite good Extremities: no edema, no joint deformities Skin: no rashes, no wounds Neurological: Weakness, memory loss, FAST score 7a-c. Lia HoppingKathyn M Indy Kuck DNP, AGPCNP-BC

## 2018-09-16 ENCOUNTER — Non-Acute Institutional Stay: Payer: Medicare Other | Admitting: Student

## 2018-09-16 ENCOUNTER — Other Ambulatory Visit: Payer: Self-pay

## 2018-09-16 VITALS — Wt 127.6 lb

## 2018-09-16 DIAGNOSIS — Z515 Encounter for palliative care: Secondary | ICD-10-CM

## 2018-09-16 NOTE — Progress Notes (Signed)
    Therapist, nutritional Palliative Care Consult Note Telephone: [phone]  Fax: [phone]  PATIENT NAME: Sheena [name] DOB: 1941/05/27 MRN: [id number]  PRIMARY CARE PROVIDER:   Bernette Redbird, MD  REFERRING PROVIDER:  Bernette Redbird, MD [address] [place], [place] [address]  RESPONSIBLE PARTY: [place], [place] Home Phone: [phone]   ASSESSMENT: Due to the COVID-19 crisis, this visit was done via telemedicine from my office and it was initiated and consent by this patient and or family. NP conducted visit via video conference; Assitant DON Candelaria Stagers assisted with visit. Sheena [name] is sitting up to wheel chair. She does engage some in conversation but has difficulty understanding why visit is being done on a device. No acute distress noted. Discussed with staff ongoing goals of care and symptom management. Palliative Medicine will continue to monitor for changes and declines and make recommendations as needed. Will monitor her weight and behaviors. Will follow up in 4 weeks. Message left for son Onalee Hua.        RECOMMENDATIONS and PLAN:  1. Code status: DNR. 2. Medical goals of therapy and symptom management: Weight loss/appetite: staff encouraged to assist with meals, monitor intake. Monitor weights. Agitation: continue seroquel as directed; monitor for effectiveness.  3. Discharge Planning: Sheena [name] will continue to reside at [facility].  Palliative Medicine will follow up in 4 weeks or sooner, if needed.   I spent 15 minutes providing this consultation,  from 3:00pm to 3:15pm. More than 50% of the time in this consultation was spent coordinating communication.   HISTORY OF PRESENT ILLNESS:  Sheena [name] is a 78 y.o.  female with multiple medical problems including Dementia, hypertension, hyperlipidemia, type 2 diabetes. Palliative Care was asked to help address goals of care; she is seen for follow up visit today. She requires assistance  for all adl's. Her appetite has been fair per staff, although she did have a 6 pound loss in the past month. Assistant DON Candelaria Stagers states patient's appetite had declined some while there was an attempt to wean her seroquel dosage. Her behaviors also worsened during this time and her seroquel is being adjusted back up to help manage behaviors again. Staff member Zac reports patient being agitated during adl care. Staff also reports patient napping more during the day. No reports of pain or dyspnea. No other changes or declines reported. No recent emergency room visits or hospitalizations.   CODE STATUS: DNR  PPS: 30% HOSPICE ELIGIBILITY/DIAGNOSIS: TBD  PAST MEDICAL HISTORY:  Past Medical History:  Diagnosis Date  . Dementia   . Diabetes mellitus   . Hypertension     SOCIAL HX:  Social History   Tobacco Use  . Smoking status: Never Smoker  . Smokeless tobacco: Never Used  Substance Use Topics  . Alcohol use: No    ALLERGIES:  Allergies  Allergen Reactions  . Bactrim Nausea And Vomiting       PHYSICAL EXAM:   General: NAD, frail appearing  Luella Cook, NP

## 2018-11-27 ENCOUNTER — Other Ambulatory Visit: Payer: Self-pay

## 2018-11-27 ENCOUNTER — Non-Acute Institutional Stay: Payer: Medicare Other | Admitting: Student

## 2018-11-27 DIAGNOSIS — Z515 Encounter for palliative care: Secondary | ICD-10-CM

## 2018-11-27 NOTE — Progress Notes (Signed)
    [facility] Consult Note Telephone: [phone]  Fax: [phone]  PATIENT NAME: Sheena [name] DOB: 1940-10-31 MRN: [id number]  PRIMARY CARE PROVIDER:   Virgel Bouquet, MD  REFERRING PROVIDER:  Virgel Bouquet, MD [place],  [place] [address]  RESPONSIBLE PARTY:  Sheena, [name] Home Phone: [phone]    ASSESSMENT:   Due to the COVID-19 crisis, this visit was done via telemedicine from my office and it was initiated and consent by this patient and or family. NP conducted visit via video conference; [place] assisted with visit. Sheena [name] is sitting up to wheel chair. NP attempted to ask patient questions. Patient does respond some. Discussed with staff. Will call son to provide an update on today's visit. Palliative Medicine will continue to follow, provide support and make recommendations as needed.     RECOMMENDATIONS and PLAN:  1. Code status: DNR. 2. Medical goals of therapy and symptom management: Appetite: staff encouraged to assist with meals, continue monitoring intake. Monitor weights. Agitation: continue seroquel as directed; monitor for effectiveness.  3. Discharge Planning: Sheena [name] will continue to reside at [facility].  Palliative Medicine will follow up in 8 weeks or sooner, if needed.   I spent 10 minutes providing this consultation,  from 2:40pm to 2:50pm. More than 50% of the time in this consultation was spent coordinating communication.   HISTORY OF PRESENT ILLNESS:  Sheena [name] is a 78 y.o.  female with multiple medical problems including dementia, hypertension, hyperlipidemia, type 2 diabetes. Palliative Care was asked to help address goals of care; she is seen for follow up visit today. Her appetite is fair; she is eating 50% of meals. Her weight is up to 132.2 pounds; almost 7 pound gain in the past two months. She will occassionally yell out. She is receiving  seroquel for agitation. Her blood sugars had been elevated; she was checked for urinary tract infection. No urinary tract infection present.  No reports of pain or dyspnea. No sleep difficulty reported. No other changes or declines reported. No recent emergency room visits or hospitalizations.    CODE STATUS: DNR  PPS: 30% HOSPICE ELIGIBILITY/DIAGNOSIS: TBD  PAST MEDICAL HISTORY:  Past Medical History:  Diagnosis Date  . Dementia   . Diabetes mellitus   . Hypertension     SOCIAL HX:  Social History   Tobacco Use  . Smoking status: Never Smoker  . Smokeless tobacco: Never Used  Substance Use Topics  . Alcohol use: No    ALLERGIES:  Allergies  Allergen Reactions  . Bactrim Nausea And Vomiting       PHYSICAL EXAM:   General: NAD, frail appearing, thin   Sheena Slocumb, NP

## 2019-03-04 ENCOUNTER — Other Ambulatory Visit: Payer: Self-pay

## 2019-03-04 ENCOUNTER — Non-Acute Institutional Stay: Payer: Medicare Other | Admitting: Adult Health Nurse Practitioner

## 2019-03-04 DIAGNOSIS — Z515 Encounter for palliative care: Secondary | ICD-10-CM

## 2019-03-04 NOTE — Progress Notes (Signed)
[facility] Consult Note Telephone: [phone]  Fax: [phone]  PATIENT NAME: Sheena [name] DOB: [birth year] MRN: [id number]  PRIMARY CARE PROVIDER:   Virgel Bouquet, MD  REFERRING PROVIDER:  Virgel Bouquet, MD [place],  [place] [address]  RESPONSIBLE PARTYDebrina, [place] Home Phone: [phone]       RECOMMENDATIONS and PLAN:  1.  Advanced care planning.  Patient is a DNR.  No concerns reported by staff today.  No falls, infections, or hospital visits since last visit.  Palliative care will continue to follow for symptom management every 8 weeks or as needed  2. Dementia.  FAST 7b.  Patient requires assistance with ADLs. Is nonambulatory. Is sitting in chair today.  Is incontinent of B&B.  Did speak a few words today but unsure as to what she was trying to say.  No reports of weight loss today.  Continue supportive care  I spent 25 minutes providing this consultation,  from 12:55 to 1:20. More than 50% of the time in this consultation was spent coordinating communication.   HISTORY OF PRESENT ILLNESS:  Sheena [name] is a 78 y.o. year old female with multiple medical problems including dementia, hypertension, hyperlipidemia, type 2 diabetes. Palliative Care was asked to help address goals of care.   CODE STATUS: DNR  PPS: 40% HOSPICE ELIGIBILITY/DIAGNOSIS: TBD  PHYSICAL EXAM:   General: patient sitting in wheelchair in NAD Neurological: Weakness; A&O to person   PAST MEDICAL HISTORY:  Past Medical History:  Diagnosis Date  . Dementia   . Diabetes mellitus   . Hypertension     SOCIAL HX:  Social History   Tobacco Use  . Smoking status: Never Smoker  . Smokeless tobacco: Never Used  Substance Use Topics  . Alcohol use: No    ALLERGIES:  Allergies  Allergen Reactions  . Bactrim Nausea And Vomiting     PERTINENT MEDICATIONS:  Outpatient Encounter Medications as of 03/04/2019  Medication Sig  .  aspirin EC 81 MG tablet Take 81 mg by mouth 2 (two) times a week. Monday and Friday.  . Calcium Carbonate-Vitamin D (CALCIUM-VITAMIN D) 600-200 MG-UNIT CAPS Take 1 tablet by mouth daily.  . cholecalciferol (VITAMIN D) 1000 UNITS tablet Take 1,000 Units by mouth daily.  . divalproex (DEPAKOTE SPRINKLE) 125 MG capsule Take 1 capsule (125 mg total) by mouth every 12 (twelve) hours.  Marland Kitchen donepezil (ARICEPT) 10 MG tablet Take 10 mg by mouth at bedtime.    . fish oil-omega-3 fatty acids 1000 MG capsule Take 1 g by mouth daily.  . insulin aspart (NOVOLOG) 100 UNIT/ML injection Inject 0-10 Units into the skin See admin instructions. Inject 0-10 units subcutaneously before meals and at bedtime per sliding scale: CBG 60-149 0 units, 150-250 5 units, 251-300 8 units, 301-350 10 units, >350 notify provider  . insulin glargine (LANTUS) 100 UNIT/ML injection Inject 10 Units into the skin at bedtime.  Marland Kitchen levothyroxine (SYNTHROID) 125 MCG tablet Take 1 tablet (125 mcg total) by mouth daily. (Patient not taking: Reported on 05/01/2017)  . levothyroxine (SYNTHROID, LEVOTHROID) 50 MCG tablet Take 50 mcg by mouth daily before breakfast.  . losartan (COZAAR) 50 MG tablet Take 2 tablets by mouth daily.  . magnesium oxide (MAG-OX) 400 MG tablet Take 200 mg by mouth daily.  . Multiple Vitamin (MULITIVITAMIN WITH MINERALS) TABS Take 1 tablet by mouth every evening.   . potassium chloride (K-DUR,KLOR-CON) 10 MEQ tablet Take 1 tablet (10 mEq  total) by mouth 2 (two) times daily.  . QUEtiapine (SEROQUEL) 25 MG tablet Take 1 tablet by mouth Nightly.  . vitamin B-12 (CYANOCOBALAMIN) 1000 MCG tablet Take 1,000 mcg by mouth every evening.   . vitamin C (ASCORBIC ACID) 500 MG tablet Take 500 mg by mouth every evening.    No facility-administered encounter medications on file as of 03/04/2019.      Emorie Mcfate Marlena Clipper, NP

## 2019-03-22 IMAGING — CT CT L SPINE W/O CM
1 of 8 series · 5 of 14 positions shown, 7 images · non-contrast
Comparison: Radiographs dated 11/01/2016 and CT scan of the abdomen
dated 09/26/2016

CLINICAL DATA: Low back pain from a fall in October 2016. Previous
compression fractures of L1 and L3.

EXAM:
CT LUMBAR SPINE WITHOUT CONTRAST
TECHNIQUE: Multidetector CT imaging of the lumbar spine was performed without
intravenous contrast administration. Multiplanar CT image
reconstructions were also generated.

[Series 3: l spine soft · axial · 0.29mm/px · z∈[-667,-505]mm · 5 of 82 slices shown, 7 images]
[im 14/82  soft-tissue]
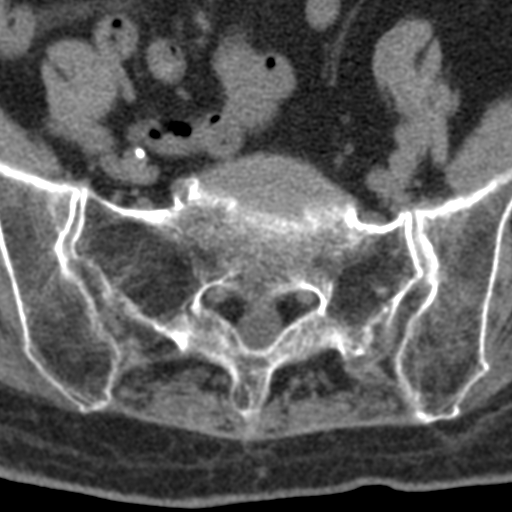
[im 14/82  bone]
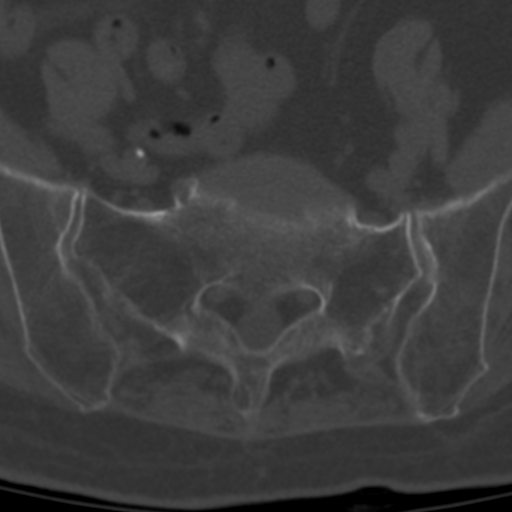
[im 28/82  bone]
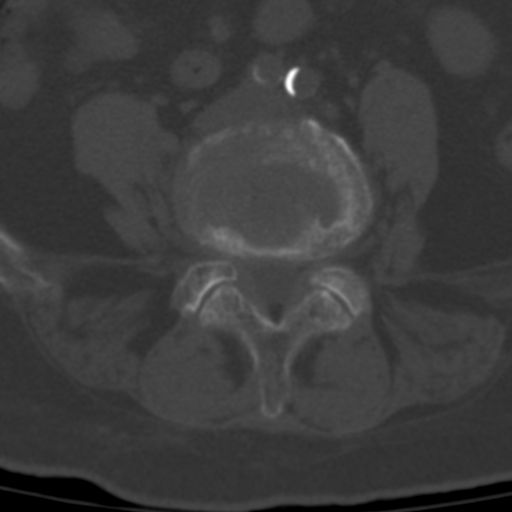
[im 41/82  bone]
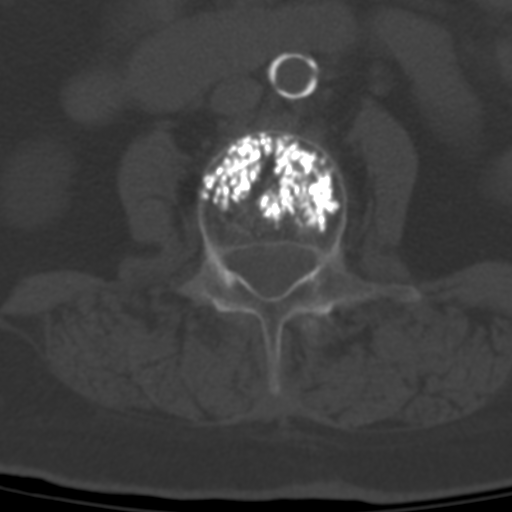
[im 55/82  bone]
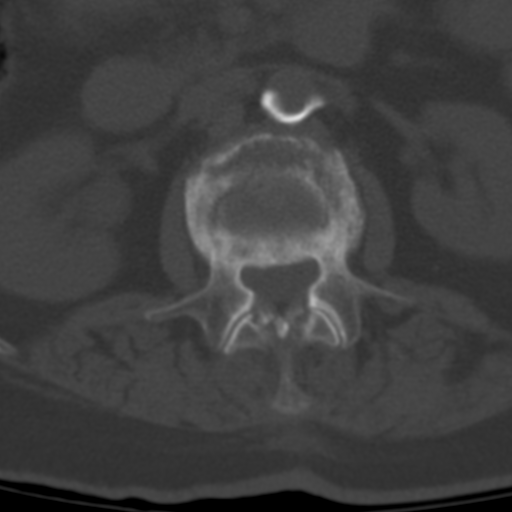
[im 68/82  soft-tissue]
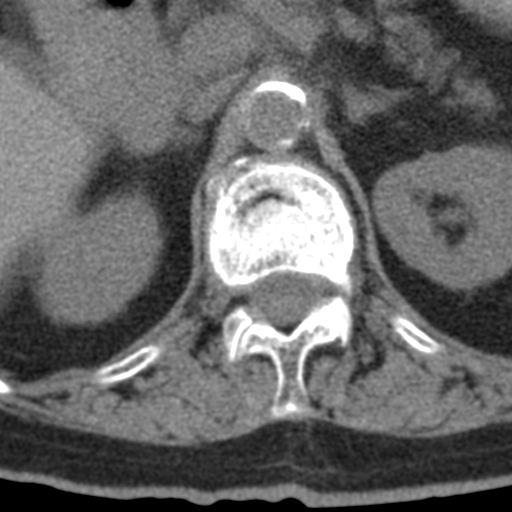
[im 68/82  bone]
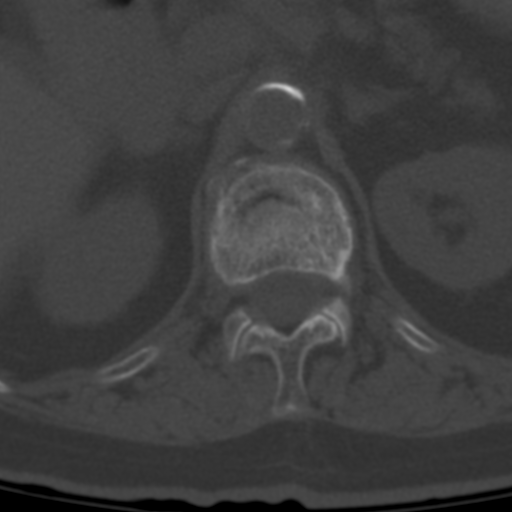

[5 of 14 positions shown; findings below may reference images not displayed]

FINDINGS: Segmentation: 5 lumbar type vertebrae.

Alignment: Slight spondylolisthesis at L2-3 and L3-4, stable.

Vertebrae: T12: Increased severity of a benign-appearing compression
fracture of T12. There is no slight protrusion of bone into the
spinal canal from the posterosuperior aspect of the T12 vertebral
body.

L1:  Stable treated compression fracture of L1.

L2: New benign-appearing compression fracture of the superior aspect
of L2 with slight protrusion of the posterosuperior aspect of L2
into the spinal canal.

L3:  Stable old treated compression fracture of L3.

L4: New compression fracture of the superior endplate of L4 with
slight protrusion of the posterosuperior aspect of L4 into the
spinal canal. Old stable compression fracture of the inferior
endplate of L4.

L5: No acute or old compression fractures of L5. Diffuse osteopenia.

Paraspinal and other soft tissues: No acute abnormality. Extensive
aortic atherosclerosis.

L1-2:  Small broad-based disc bulge without neural impingement.

L2-3: Diffuse disc bulge with slight compression of the thecal sac.
Hypertrophy of the ligamentum flavum and facet joints combine to
create moderate spinal stenosis.

L3-4: Diffuse broad-based disc bulge with hypertrophy of the
ligamentum flavum facet joints could which combine to create
moderately severe spinal stenosis.

L4-5: Diffuse disc bulge with hypertrophy of the ligamentum flavum
which combine to create moderate spinal stenosis and bilateral
lateral recess stenosis, right greater than left.

L5-S1:  No significant abnormality.
IMPRESSION: 1. Progression of benign-appearing compression fracture at T12.
2. New benign-appearing compression fracture of the superior aspect
of L2.
3. New compression fracture of the superior endplate of L4.
4. Moderately severe spinal stenosis at L3-4, stable.
5. Moderate spinal stenosis at L2-3 and L4-5, stable at L2-3 and new
at L4-5.

## 2019-05-12 DEATH — deceased
